# Patient Record
Sex: Female | Born: 1960 | Race: White | Hispanic: No | Marital: Single | Smoking: Current every day smoker
Health system: Southern US, Community
[De-identification: ages and names within clinical notes are randomized; demographics above are authoritative.]

## PROBLEM LIST (undated history)

## (undated) DIAGNOSIS — M1990 Unspecified osteoarthritis, unspecified site: Secondary | ICD-10-CM

## (undated) DIAGNOSIS — I10 Essential (primary) hypertension: Secondary | ICD-10-CM

## (undated) DIAGNOSIS — K219 Gastro-esophageal reflux disease without esophagitis: Secondary | ICD-10-CM

## (undated) DIAGNOSIS — T7840XA Allergy, unspecified, initial encounter: Secondary | ICD-10-CM

## (undated) DIAGNOSIS — G4730 Sleep apnea, unspecified: Secondary | ICD-10-CM

## (undated) DIAGNOSIS — E079 Disorder of thyroid, unspecified: Secondary | ICD-10-CM

## (undated) DIAGNOSIS — E669 Obesity, unspecified: Secondary | ICD-10-CM

## (undated) DIAGNOSIS — N189 Chronic kidney disease, unspecified: Secondary | ICD-10-CM

## (undated) HISTORY — DX: Essential (primary) hypertension: I10

## (undated) HISTORY — DX: Chronic kidney disease, unspecified: N18.9

## (undated) HISTORY — DX: Disorder of thyroid, unspecified: E07.9

## (undated) HISTORY — DX: Gastro-esophageal reflux disease without esophagitis: K21.9

## (undated) HISTORY — DX: Allergy, unspecified, initial encounter: T78.40XA

## (undated) HISTORY — PX: HIP SURGERY: SHX245

## (undated) HISTORY — DX: Unspecified osteoarthritis, unspecified site: M19.90

## (undated) HISTORY — PX: HERNIA REPAIR: SHX51

## (undated) HISTORY — PX: OOPHORECTOMY: SHX86

## (undated) HISTORY — DX: Sleep apnea, unspecified: G47.30

---

## 1988-04-05 HISTORY — PX: CERVICAL CONE BIOPSY: SUR198

## 1998-02-24 ENCOUNTER — Emergency Department (HOSPITAL_COMMUNITY): Admission: EM | Admit: 1998-02-24 | Discharge: 1998-02-24 | Payer: Self-pay | Admitting: Emergency Medicine

## 2009-04-05 HISTORY — PX: ABDOMINAL HYSTERECTOMY: SHX81

## 2009-05-22 ENCOUNTER — Ambulatory Visit: Payer: Self-pay | Admitting: Gynecology

## 2009-05-22 ENCOUNTER — Other Ambulatory Visit: Admission: RE | Admit: 2009-05-22 | Discharge: 2009-05-22 | Payer: Self-pay | Admitting: Gynecology

## 2009-05-29 ENCOUNTER — Ambulatory Visit: Payer: Self-pay | Admitting: Gynecology

## 2009-06-03 ENCOUNTER — Ambulatory Visit (HOSPITAL_COMMUNITY): Admission: RE | Admit: 2009-06-03 | Discharge: 2009-06-03 | Payer: Self-pay | Admitting: Gynecology

## 2009-06-11 ENCOUNTER — Ambulatory Visit (HOSPITAL_COMMUNITY): Admission: RE | Admit: 2009-06-11 | Discharge: 2009-06-11 | Payer: Self-pay | Admitting: Gynecology

## 2009-11-27 ENCOUNTER — Ambulatory Visit: Payer: Self-pay | Admitting: Gynecology

## 2010-01-02 ENCOUNTER — Encounter: Admission: RE | Admit: 2010-01-02 | Discharge: 2010-01-02 | Payer: Self-pay | Admitting: Internal Medicine

## 2010-08-12 ENCOUNTER — Ambulatory Visit (HOSPITAL_COMMUNITY): Payer: 59

## 2010-08-12 ENCOUNTER — Ambulatory Visit (HOSPITAL_COMMUNITY)
Admission: RE | Admit: 2010-08-12 | Discharge: 2010-08-12 | Disposition: A | Discharge status: Home / Self Care | Payer: 59 | Source: Ambulatory Visit | Attending: Surgery | Admitting: Surgery

## 2010-08-12 DIAGNOSIS — I491 Atrial premature depolarization: Secondary | ICD-10-CM | POA: Insufficient documentation

## 2010-08-12 DIAGNOSIS — Z01812 Encounter for preprocedural laboratory examination: Secondary | ICD-10-CM | POA: Insufficient documentation

## 2010-08-12 DIAGNOSIS — I10 Essential (primary) hypertension: Secondary | ICD-10-CM | POA: Insufficient documentation

## 2010-08-12 DIAGNOSIS — I498 Other specified cardiac arrhythmias: Secondary | ICD-10-CM | POA: Insufficient documentation

## 2010-08-12 DIAGNOSIS — L02419 Cutaneous abscess of limb, unspecified: Secondary | ICD-10-CM | POA: Insufficient documentation

## 2010-08-12 DIAGNOSIS — E119 Type 2 diabetes mellitus without complications: Secondary | ICD-10-CM | POA: Insufficient documentation

## 2010-08-12 LAB — DIFFERENTIAL
Lymphocytes Relative: 22 % (ref 12–46)
Monocytes Absolute: 0.9 10*3/uL (ref 0.1–1.0)
Neutro Abs: 8.8 10*3/uL — ABNORMAL HIGH (ref 1.7–7.7)

## 2010-08-12 LAB — BASIC METABOLIC PANEL
BUN: 13 mg/dL (ref 6–23)
Calcium: 10.4 mg/dL (ref 8.4–10.5)
Chloride: 96 mEq/L (ref 96–112)
Creatinine, Ser: 0.71 mg/dL (ref 0.4–1.2)
GFR calc Af Amer: >60 mL/min (ref >60)
Potassium: 3.8 mEq/L (ref 3.5–5.1)

## 2010-08-12 LAB — SURGICAL PCR SCREEN: Staphylococcus aureus: NEGATIVE

## 2010-08-12 LAB — GLUCOSE, CAPILLARY: Glucose-Capillary: 210 mg/dL — ABNORMAL HIGH (ref 70–99)

## 2010-08-12 LAB — CBC
MCH: 32.4 pg (ref 26.0–34.0)
MCHC: 35.9 g/dL (ref 30.0–36.0)
RDW: 13 % (ref 11.5–15.5)

## 2010-08-15 LAB — CULTURE, ROUTINE-ABSCESS

## 2010-08-17 LAB — ANAEROBIC CULTURE

## 2010-08-17 NOTE — H&P (Signed)
  NAME:  Margaret Carey, Margaret Carey NO.:  [id number]  MEDICAL RECORD NO.:  [id number]          PATIENT TYPE:  LOCATION:                                 FACILITY:  PHYSICIAN:  Juanetta Gosling, MDDATE OF BIRTH:  [birth year]  DATE OF ADMISSION: DATE OF DISCHARGE:                             HISTORY & PHYSICAL   CHIEF COMPLAINTS:  Right thigh abscess.  HISTORY OF PRESENT ILLNESS:  This is a 50 year old female with about a 5- day history of an increasing in size increasingly tender inner right thigh mass.  This has been red that it has been hot and it has been worsening over this same time frame.  She denies any fevers.  Denies any drainage.  Denies any prior history of this.  She came in just because it was worsening.  She was put on doxycycline by Dr. Ferd Hibbs office and referred.  PAST MEDICAL HISTORY: 1. Hypertension. 2. Diabetes. 3. Hypothyroidism.  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  FAMILY HISTORY:  Significant for cardiovascular disease and diabetes.  SOCIAL HISTORY:  She is a half-pack per day smoker.  Drinks rare alcohol.  ALLERGIES:  No known drug allergies.  MEDICATIONS:  Janumet, lisinopril, Cytomel, Synthroid and doxycycline.  REVIEW OF SYSTEMS:  Otherwise negative.  PHYSICAL EXAMINATION:  VITAL SIGNS:  Temperature 97.6, pulse 112, blood pressure 128/80.  She is 5 feet 3-1/2 inches, weight 264 pounds. GENERAL:  She is a well-appearing female, in no distress. HEENT:  She has no scleral icterus. NECK:  Supple without adenopathy. HEART:  Tachycardiac.  Regular rhythm. EXTREMITIES:  In her right medial thigh, she has about 20 x 10 cm fluctuant, tender and erythematous abscess.  ASSESSMENT:  Right thigh abscess.  PLAN:  I think this will be best served by drainage in the operating room.  She understands this.  I discussed this with Dr. Daphine Deutscher.  She is going to go over the hospital  and we discussed incision and drainage under anesthesia due to its size.  I think she is also going to be need to be on some IV antibiotics at least overnight.     Juanetta Gosling, MD     MCW/MEDQ  D:  08/12/2010  T:  08/12/2010  Job:  [id number]  cc:   [place] Daphine Deutscher, MD [address], [address] [place] [address]  Gwen Pounds, MD Fax: [phone]  Electronically Signed by Emelia Loron MD on 08/17/2010 02:36:29 PM

## 2010-08-20 NOTE — Op Note (Signed)
  NAME:  Margaret Carey, Margaret Carey NO.:  [id number]  MEDICAL RECORD NO.:  [id number]            PATIENT TYPE:  LOCATION:                                 FACILITY:  PHYSICIAN:  [place]. Daphine Deutscher, MD       DATE OF BIRTH:  DATE OF PROCEDURE:  08/12/2010 DATE OF DISCHARGE:                              OPERATIVE REPORT   PREOPERATIVE DIAGNOSIS:  Right medial thigh abscess.  POSTOPERATIVE DIAGNOSIS:  Right medial thigh abscess.  FINDINGS:  Non-foul-smelling purulent pus drained from this abscess.  It was about golf ball sized, area of induration  SURGEON:  Molli Hazard B. Daphine Deutscher, MD  ANESTHESIA:  General by LMA.  DESCRIPTION OF PROCEDURE:  This 50 year old diabetic lady was sent from the office for drainage of an abscess on her medial thigh.  This area was marked preop and prepped with PCMX.  I went ahead after anesthesia was given and a time-out performed.  I went ahead and aspirated this area with an 18 gauge needle and then cut down into this hard, indurated area and got into frank pus.  I cultured this aerobes and anaerobes.  I explored the wound and broke up any of the loculations.  I then irrigated with 100 cc of saline.  Approximately 8 inches of Iodoform gauze was packed into the wound which was actually little triangular area where I excised a little bit of skin to keep this open and to promote drainage.  She tolerated the procedure well.  Since she had been brought in, was fairly stable and did not appear anyway septic and had been taking doxycycline, I went ahead and gave her 3 g of Unasyn.  We will keep her on doxycycline.  Instructions were given for wound management and we will see her back in the office in about a week.[place] Daphine Deutscher, MD     MBM/MEDQ  D:  08/12/2010  T:  08/12/2010  Job:  [id number]  cc:   Gwen Pounds, MD Fax: [phone]  Electronically Signed by Luretha Murphy MD on 08/20/2010 07:06:21 AM

## 2011-03-06 ENCOUNTER — Encounter: Payer: Self-pay | Admitting: *Deleted

## 2011-03-06 ENCOUNTER — Emergency Department (HOSPITAL_COMMUNITY)
Admission: EM | Admit: 2011-03-06 | Discharge: 2011-03-06 | Disposition: A | Discharge status: Home / Self Care | Payer: 59 | Attending: Emergency Medicine | Admitting: Emergency Medicine

## 2011-03-06 DIAGNOSIS — M545 Low back pain, unspecified: Secondary | ICD-10-CM | POA: Insufficient documentation

## 2011-03-06 DIAGNOSIS — R109 Unspecified abdominal pain: Secondary | ICD-10-CM | POA: Insufficient documentation

## 2011-03-06 HISTORY — DX: Obesity, unspecified: E66.9

## 2011-03-06 LAB — URINALYSIS, ROUTINE W REFLEX MICROSCOPIC
Ketones, ur: NEGATIVE mg/dL
Protein, ur: NEGATIVE mg/dL

## 2011-03-06 NOTE — ED Notes (Signed)
Pain in lower back and rt side for two weeks

## 2011-03-08 ENCOUNTER — Other Ambulatory Visit: Payer: Self-pay | Admitting: Internal Medicine

## 2011-03-09 ENCOUNTER — Ambulatory Visit
Admission: RE | Admit: 2011-03-09 | Discharge: 2011-03-09 | Disposition: A | Discharge status: Home / Self Care | Payer: 59 | Source: Ambulatory Visit | Attending: Internal Medicine | Admitting: Internal Medicine

## 2011-03-09 MED ORDER — IOHEXOL 300 MG/ML  SOLN
125.0000 mL | Freq: Once | INTRAMUSCULAR | Status: AC | PRN
  Administered 2011-03-09: 125 mL via INTRAVENOUS

## 2011-05-07 ENCOUNTER — Other Ambulatory Visit: Payer: Self-pay | Admitting: Endocrinology

## 2011-05-07 DIAGNOSIS — R0989 Other specified symptoms and signs involving the circulatory and respiratory systems: Secondary | ICD-10-CM

## 2011-05-13 ENCOUNTER — Ambulatory Visit
Admission: RE | Admit: 2011-05-13 | Discharge: 2011-05-13 | Disposition: A | Discharge status: Home / Self Care | Payer: 59 | Source: Ambulatory Visit | Attending: Endocrinology | Admitting: Endocrinology

## 2011-05-13 DIAGNOSIS — R0989 Other specified symptoms and signs involving the circulatory and respiratory systems: Secondary | ICD-10-CM

## 2012-01-24 ENCOUNTER — Other Ambulatory Visit: Payer: Self-pay | Admitting: Gynecology

## 2012-01-24 DIAGNOSIS — Z1231 Encounter for screening mammogram for malignant neoplasm of breast: Secondary | ICD-10-CM

## 2012-02-03 ENCOUNTER — Encounter: Payer: Self-pay | Admitting: Gynecology

## 2012-02-03 DIAGNOSIS — I10 Essential (primary) hypertension: Secondary | ICD-10-CM | POA: Insufficient documentation

## 2012-02-03 DIAGNOSIS — E079 Disorder of thyroid, unspecified: Secondary | ICD-10-CM | POA: Insufficient documentation

## 2012-02-10 ENCOUNTER — Ambulatory Visit (INDEPENDENT_AMBULATORY_CARE_PROVIDER_SITE_OTHER): Payer: 59 | Admitting: Gynecology

## 2012-02-10 ENCOUNTER — Encounter: Payer: Self-pay | Admitting: Gynecology

## 2012-02-10 ENCOUNTER — Ambulatory Visit (HOSPITAL_COMMUNITY)
Admission: RE | Admit: 2012-02-10 | Discharge: 2012-02-10 | Disposition: A | Discharge status: Home / Self Care | Payer: 59 | Source: Ambulatory Visit | Attending: Gynecology | Admitting: Gynecology

## 2012-02-10 VITALS — BP 120/74 | Ht 63.0 in | Wt 252.0 lb

## 2012-02-10 DIAGNOSIS — IMO0002 Reserved for concepts with insufficient information to code with codable children: Secondary | ICD-10-CM

## 2012-02-10 DIAGNOSIS — Z1231 Encounter for screening mammogram for malignant neoplasm of breast: Secondary | ICD-10-CM | POA: Insufficient documentation

## 2012-02-10 DIAGNOSIS — Z01419 Encounter for gynecological examination (general) (routine) without abnormal findings: Secondary | ICD-10-CM

## 2012-02-10 DIAGNOSIS — N951 Menopausal and female climacteric states: Secondary | ICD-10-CM

## 2012-02-10 MED ORDER — ESTRADIOL 0.05 MG/24HR TD PTTW: 1.0000 | MEDICATED_PATCH | TRANSDERMAL | Status: DC

## 2012-02-10 NOTE — Patient Instructions (Addendum)
Schedule bone density Schedule colonoscopy      Hormone Therapy At menopause, your body begins making less estrogen and progesterone hormones. This causes the body to stop having menstrual periods. This is because estrogen and progesterone hormones control your periods and menstrual cycle. A lack of estrogen may cause symptoms such as:  Hot flushes (or hot flashes).  Vaginal dryness.  Dry skin.  Loss of sex drive.  Risk of bone loss (osteoporosis). When this happens, you may choose to take hormone therapy to get back the estrogen lost during menopause. When the hormone estrogen is given alone, it is usually referred to as ET (Estrogen Therapy). When the hormone progestin is combined with estrogen, it is generally called HT (Hormone Therapy). This was formerly known as hormone replacement therapy (HRT). Your caregiver can help you make a decision on what will be best for you. The decision to use HT seems to change often as new studies are done. Many studies do not agree on the benefits of hormone replacement therapy. LIKELY BENEFITS OF HT INCLUDE PROTECTION FROM:  Hot Flushes (also called hot flashes) - A hot flush is a sudden feeling of heat that spreads over the face and body. The skin may redden like a blush. It is connected with sweats and sleep disturbance. Women going through menopause may have hot flushes a few times a month or several times per day depending on the woman.  Osteoporosis (bone loss)- Estrogen helps guard against bone loss. After menopause, a woman's bones slowly lose calcium and become weak and brittle. As a result, bones are more likely to break. The hip, wrist, and spine are affected most often. Hormone therapy can help slow bone loss after menopause. Weight bearing exercise and taking calcium with vitamin D also can help prevent bone loss. There are also medications that your caregiver can prescribe that can help prevent osteoporosis.  Vaginal Dryness - Loss of  estrogen causes changes in the vagina. Its lining may become thin and dry. These changes can cause pain and bleeding during sexual intercourse. Dryness can also lead to infections. This can cause burning and itching. (Vaginal estrogen treatment can help relieve pain, itching, and dryness.)  Urinary Tract Infections are more common after menopause because of lack of estrogen. Some women also develop urinary incontinence because of low estrogen levels in the vagina and bladder.  Possible other benefits of estrogen include a positive effect on mood and short-term memory in women. RISKS AND COMPLICATIONS  Using estrogen alone without progesterone causes the lining of the uterus to grow. This increases the risk of lining of the uterus (endometrial) cancer. Your caregiver should give another hormone called progestin if you have a uterus.  Women who take combined (estrogen and progestin) HT appear to have an increased risk of breast cancer. The risk appears to be small, but increases throughout the time that HT is taken.  Combined therapy also makes the breast tissue slightly denser which makes it harder to read mammograms (breast X-rays).  Combined, estrogen and progesterone therapy can be taken together every day, in which case there may be spotting of blood. HT therapy can be taken cyclically in which case you will have menstrual periods. Cyclically means HT is taken for a set amount of days, then not taken, then this process is repeated.  HT may increase the risk of stroke, heart attack, breast cancer and forming blood clots in your leg.  Transdermal estrogen (estrogen that is absorbed through the skin with a  patch or a cream) may have more positive results with:  Cholesterol.  Blood pressure.  Blood clots. Having the following conditions may indicate you should not have HT:  Endometrial cancer.  Liver disease.  Breast cancer.  Heart disease.  History of blood  clots.  Stroke. TREATMENT   If you choose to take HT and have a uterus, usually estrogen and progestin are prescribed.  Your caregiver will help you decide the best way to take the medications.  Possible ways to take estrogen include:  Pills.  Patches.  Gels.  Sprays.  Vaginal estrogen cream, rings and tablets.  It is best to take the lowest dose possible that will help your symptoms and take them for the shortest period of time that you can.  Hormone therapy can help relieve some of the problems (symptoms) that affect women at menopause. Before making a decision about HT, talk to your caregiver about what is best for you. Be well informed and comfortable with your decisions. HOME CARE INSTRUCTIONS   Follow your caregivers advice when taking the medications.  A Pap test is done to screen for cervical cancer.  The first Pap test should be done at age 76.  Between ages 25 and 29, Pap tests are repeated every 2 years.  Beginning at age 60, you are advised to have a Pap test every 3 years as long as your past 3 Pap tests have been normal.  Some women have medical problems that increase the chance of getting cervical cancer. Talk to your caregiver about these problems. It is especially important to talk to your caregiver if a new problem develops soon after your last Pap test. In these cases, your caregiver may recommend more frequent screening and Pap tests.  The above recommendations are the same for women who have or have not gotten the vaccine for HPV (Human Papillomavirus).  If you had a hysterectomy for a problem that was not a cancer or a condition that could lead to cancer, then you no longer need Pap tests. However, even if you no longer need a Pap test, a regular exam is a good idea to make sure no other problems are starting.   If you are between ages 77 and 25, and you have had normal Pap tests going back 10 years, you no longer need Pap tests. However, even if you  no longer need a Pap test, a regular exam is a good idea to make sure no other problems are starting.   If you have had past treatment for cervical cancer or a condition that could lead to cancer, you need Pap tests and screening for cancer for at least 20 years after your treatment.  If Pap tests have been discontinued, risk factors (such as a new sexual partner) need to be re-assessed to determine if screening should be resumed.  Some women may need screenings more often if they are at high risk for cervical cancer.  Get mammograms done as per the advice of your caregiver. SEEK IMMEDIATE MEDICAL CARE IF:  You develop abnormal vaginal bleeding.  You have pain or swelling in your legs, shortness of breath, or chest pain.  You develop dizziness or headaches.  You have lumps or changes in your breasts or armpits.  You have slurred speech.  You develop weakness or numbness of your arms or legs.  You have pain, burning, or bleeding when urinating.  You develop abdominal pain. Document Released: 12/19/2002 Document Revised: 06/14/2011 Document Reviewed: 04/08/2010 [employer]  Patient Information 2013 [employer].  

## 2012-02-10 NOTE — Progress Notes (Signed)
Margaret Carey 1960/09/17 [id number]        51 y.o.  [id number] for annual exam.    Past medical history,surgical history, medications, allergies, family history and social history were all reviewed and documented in the EPIC chart. ROS:  Was performed and pertinent positives and negatives are included in the history.  Exam: Kim assistant Filed Vitals:   02/10/12 0959  BP: 120/74  Height: 5\' 3"  (1.6 m)  Weight: 252 lb (114.306 kg)   General appearance  Normal Skin grossly normal Head/Neck normal with no cervical or supraclavicular adenopathy thyroid normal Lungs  clear Cardiac RR, without RMG Abdominal  soft, nontender, without masses, organomegaly or hernia Breasts  examined lying and sitting without masses, retractions, discharge or axillary adenopathy. Pelvic  Ext/BUS/vagina  normal   Adnexa  Without masses or tenderness    Anus and perineum  normal   Rectovaginal  normal sphincter tone without palpated masses or tenderness.    Assessment/Plan:  51 y.o. [id number] female for annual exam.   1. Status post TAH RSO for mature cystic teratoma by Dr. Noland Fordyce 2011. Had prior LSO. Had been placed on Vivelle dot ERT but was discontinued. Now with hot flashes/night sweats/sleep disturbances/vaginal dryness and dyspareunia. Had discussed with Dr. Serrita Lueth Lasso her primary physician and per her history she felt uncomfortable with the ERT. I discussed the issues of ERT risks/benefits WHI study with increased risk of stroke heart attack DVT possible breast cancer issues. The issues of transdermal versus oral/first pass effect. She does have medical issues, smokes and is obese. I reviewed with her there is evidence that ERT may benefit her from a cardiovascular standpoint if started early but certainly she does have increased risk factors for thrombosis would have to accept this with ERT. Patient wants to start and I prescribed MiniVivelle 0.05  2 weeks sample one year prescription. She'll call me in follow up and  if she does well she'll continue if not then we'll increase to 0.075 and possibly 0.1. 2. Mammography. Patient had today and will continue with annual mammograms. SBE monthly reviewed. 3. Pap smear. No Pap smear done today. Last Pap smear 2011. She does have a history of cone biopsy in 1990. She's over 20 years out and the options to stop screening altogether versus less frequent screening reviewed. At this point I recommend less frequent screening every 3 years and she agrees with this. 4. Colonoscopy. Patient never had and I recommend she arrange now she agrees to do so. 5. Bone density. Patient reports 10 years ago. We'll schedule baseline now. Increase calcium vitamin D discussed. 6. Health maintenance. No blood work done today as it is done through Dr. Ferd Hibbs office. Follow up with ERT results and for DEXA, otherwise annually.    Dara Lords MD, 10:39 AM 02/10/2012

## 2012-11-29 ENCOUNTER — Institutional Professional Consult (permissible substitution): Payer: Self-pay | Admitting: Neurology

## 2012-12-06 ENCOUNTER — Institutional Professional Consult (permissible substitution): Payer: Self-pay | Admitting: Neurology

## 2012-12-13 ENCOUNTER — Ambulatory Visit (INDEPENDENT_AMBULATORY_CARE_PROVIDER_SITE_OTHER): Payer: 59 | Admitting: Neurology

## 2012-12-13 ENCOUNTER — Encounter: Payer: Self-pay | Admitting: Neurology

## 2012-12-13 VITALS — BP 134/80 | HR 99 | Temp 98.7°F | Ht 64.0 in | Wt 251.0 lb

## 2012-12-13 DIAGNOSIS — G4733 Obstructive sleep apnea (adult) (pediatric): Secondary | ICD-10-CM

## 2012-12-13 NOTE — Progress Notes (Signed)
Subjective:    Patient ID: Margaret Carey is a 52 y.o. female.  HPI  Huston Foley, MD, PhD [facility] [address], [address] [address] [place], [place] [address]   Dear Dr. Timothy Lasso,  I saw your patient, Margaret Carey,  upon your kind request in my neurologic clinic today for initial consultation of her sleep disorder, in particular concern for obstructive sleep apnea. The patient is unaccompanied today. As you know, Ms. Aldama is a very pleasant 52 year old right-handed woman with an underlying medical history of hypothyroidism, diabetes, hyperlipidemia, hypertension, obesity, and insomnia, who has been noted to snore and reports witnessed breathing pauses while asleep. She also complains of nonrestorative sleep and trouble staying asleep as well as going to sleep. This started about 7 years ago, when her father became sick and then he passed away. Her mother passed away in 04/24/22. She switched from 3rd shift to 1st shift about 18 months ago. 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  She never tried Ambien, for concern for [employer]. She has tried trazodone, but feels drowsy during the day and has not been taking it for that reason.   Her typical bedtime is reported to be around 7 PM and usual wake time is around 2:30 to 3:30 AM. Sleep onset typically occurs within hours, some times 2, sometimes 4 hours. She reports feeling poorly rested upon awakening. She wakes up on an average 2 to 4 times in the middle of the night and has to go to the bathroom 1 to 2 times on a typical night. She reports rare morning headaches, only with sinus congestion.  She reports excessive daytime somnolence (EDS) and Her Epworth Sleepiness Score (ESS) is 7/24 today. She has not fallen asleep while driving. The patient has not been taking a planned nap, but may sleep on WE for 20 to 60 minutes, which is occasionally refreshing.   She has been known to snore for the past many years. Snoring is reportedly  marked, and associated with choking sounds and witnessed apneas. The patient admits to a sense of choking or strangling feeling. There is no report of nighttime reflux, with no nighttime cough experienced. The patient has noted mild RLS symptoms and is not known to kick while asleep or before falling asleep. She had b/l THR in 2000, 2001 and again on the L in 2002. There is no family history of RLS or OSA.  She is a restless sleeper and in the morning, the bed is at times disheveled. She turns from side to side d/t hip pain and sometimes sleeps on the couch or recliner. She has not been sleeping on her back d/t LBP.  She denies cataplexy, sleep paralysis, hypnagogic or hypnopompic hallucinations, or sleep attacks. She does not report any vivid dreams, nightmares, dream enactments, or parasomnias, such as sleep talking or sleep walking. The patient has not had a sleep study or a home sleep test.  She consumes 4 caffeinated beverages per day, usually in the form of coffee.  Her bedroom is usually dark and cool. There is a TV in the bedroom and usually it is not on at night.   Her Past Medical History Is Significant For: Past Medical History  Diagnosis Date  . Diabetes mellitus   . Obese   . Thyroid disease     Hypothyroid  . Hypertension     Her Past Surgical History Is Significant For: Past Surgical History  Procedure Laterality Date  . Hip  surgery      Bilateral replacement  . Oophorectomy      LSO and RSO  . Hernia repair    . Cervical cone biopsy  1990  . Abdominal hysterectomy  2011    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    Her Family History Is Significant For: Family History  Problem Relation Age of Onset  . Breast cancer Mother     Age 14  . Diabetes Mother   . Lung cancer Father     Her Social History Is Significant For: History   Social History  . Marital Status: Single    Spouse Name: N/A    Number of Children: N/A  . Years of Education: N/A   Social History Main Topics   . Smoking status: Current Every Day Smoker -- 0.50 packs/day    Types: Cigarettes  . Smokeless tobacco: None  . Alcohol Use: Yes     Comment: Rare  . Drug Use: No  . Sexual Activity: No   Other Topics Concern  . None   Social History Narrative  . None    Her Allergies Are:  No Known Allergies:   Her Current Medications Are:  Outpatient Encounter Prescriptions as of 12/13/2012  Medication Sig Dispense Refill  . ARMOUR THYROID PO Take by mouth.      Marland Kitchen glimepiride (AMARYL) 4 MG tablet Take 4 mg by mouth 2 (two) times daily.        Marland Kitchen lisinopril-hydrochlorothiazide (PRINZIDE,ZESTORETIC) 10-12.5 MG per tablet Take 1 tablet by mouth 2 (two) times daily.        . sitaGLIPtan-metformin (JANUMET) 50-1000 MG per tablet Take 1 tablet by mouth 2 (two) times daily with a meal.        . estradiol (MINIVELLE) 0.05 MG/24HR Place 1 patch (0.05 mg total) onto the skin 2 (two) times a week.  8 patch  12  . traZODone (DESYREL) 50 MG tablet Take 1 tablet by mouth as needed.      . [DISCONTINUED] guaiFENesin (MUCINEX) 600 MG 12 hr tablet Take 1,200 mg by mouth 2 (two) times daily.         No facility-administered encounter medications on file as of 12/13/2012.  :  Review of Systems:  Out of a complete 14 point review of systems, all are reviewed and negative with the exception of these symptoms as listed below:  Review of Systems  Constitutional: Positive for fatigue.  Eyes: Positive for visual disturbance (blurred vision).  Respiratory:       Snoring  Endocrine: Positive for heat intolerance.  Neurological: Positive for dizziness.       Memory loss  Psychiatric/Behavioral: Positive for sleep disturbance and dysphoric mood.    Objective:  Neurologic Exam  Physical Exam Physical Examination:   Filed Vitals:   12/13/12 0957  BP: 134/80  Pulse: 99  Temp: 98.7 F (37.1 C)    General Examination: The patient is a very pleasant 52 y.o. female in no acute distress. She appears  well-developed and well-nourished and very well groomed. She is obese.  HEENT: Normocephalic, atraumatic, pupils are equal, round and reactive to light and accommodation. Funduscopic exam is normal with sharp disc margins noted. Extraocular tracking is good without limitation to gaze excursion or nystagmus noted. Normal smooth pursuit is noted. Hearing is grossly intact. Tympanic membranes are clear bilaterally. Face is symmetric with normal facial animation and normal facial sensation. Speech is clear with no dysarthria noted. There is no hypophonia. There is no lip, neck/head, jaw  or voice tremor. Neck is supple with full range of passive and active motion. There are no carotid bruits on auscultation. Oropharynx exam reveals: mild mouth dryness, adequate dental hygiene and moderate airway crowding, due to narrow airway entry, larger uvula and larger tongue and tonsillar size. Mallampati is class II. Tongue protrudes centrally and palate elevates symmetrically. Tonsils are 1+ to 2+. Neck size is 17 inches.   Chest: Clear to auscultation without wheezing, rhonchi or crackles noted.  Heart: S1+S2+0, regular and normal without murmurs, rubs or gallops noted.   Abdomen: Soft, non-tender and non-distended with normal bowel sounds appreciated on auscultation.  Extremities: There is trace pitting edema in the distal lower extremities bilaterally. Pedal pulses are intact.  Skin: Warm and dry without trophic changes noted. There are varicose veins in the R distal leg and the L proximal.  Musculoskeletal: exam reveals no obvious joint deformities, tenderness or joint swelling or erythema.   Neurologically:  Mental status: The patient is awake, alert and oriented in all 4 spheres. Her memory, attention, language and knowledge are appropriate. There is no aphasia, agnosia, apraxia or anomia. Speech is clear with normal prosody and enunciation. Thought process is linear. Mood is congruent and affect is normal.   Cranial nerves are as described above under HEENT exam. In addition, shoulder shrug is normal with equal shoulder height noted. Motor exam: Normal bulk, strength and tone is noted. There is no drift, tremor or rebound. Romberg is negative. Reflexes are 2+ throughout. Toes are downgoing bilaterally. Fine motor skills are intact with normal finger taps, normal hand movements, normal rapid alternating patting, normal foot taps and normal foot agility.  Cerebellar testing shows no dysmetria or intention tremor on finger to nose testing. Heel to shin is unremarkable bilaterally. There is no truncal or gait ataxia.  Sensory exam is intact to light touch, pinprick, vibration, temperature sense and proprioception in the upper and lower extremities.  Gait, station and balance are unremarkable. No veering to one side is noted. No leaning to one side is noted. Posture is age-appropriate and stance is narrow based. No problems turning are noted. She turns en bloc. Tandem walk is difficult for her. Intact toe stance is noted.               Assessment and Plan:   In summary, VELEKA DJORDJEVIC is a very pleasant 52 y.o.-year old female with a history and physical exam concerning for obstructive sleep apnea (OSA). I had a long chat with the patient about my findings and the diagnosis, its prognosis and treatment options. We talked about medical treatments and non-pharmacological approaches. I explained in particular the risks and ramifications of untreated moderate to severe OSA, especially with respect to developing cardiovascular disease down the Road, including congestive heart failure, difficult to treat hypertension, cardiac arrhythmias, or stroke. Even type 2 diabetes has in part been linked to untreated OSA. We talked about trying to maintain a healthy lifestyle in general, as well as the importance of weight control. I encouraged the patient to eat healthy, exercise daily and keep well hydrated, to keep a scheduled  bedtime and wake time routine, to not skip any meals and eat healthy snacks in between meals.  I recommended the following at this time: sleep study with potential CPAP titration.  I explained the sleep test procedure to the patient and also outlined surgical and non-surgical treatment options of OSA including the use of a dental custom-made appliance, upper airway surgery such as pillar  implants, radiofrequency surgery, tongue base surgery, and UPPP. I also explained the CPAP treatment option to the patient, who indicated that she would be willing to try CPAP if the need arises. I explained the importance of being compliant with PAP treatment, not only for insurance purposes but primarily for the patient's long term health benefit. I answered all her questions today and the patient was in agreement. I would like to see her back after the sleep study is completed and encouraged her to call with any interim questions, concerns, problems or updates.   Thank you very much for allowing me to participate in the care of this nice patient. If I can be of any further assistance to you please do not hesitate to call me at [phone].  Sincerely,   Huston Foley, MD, PhD

## 2012-12-13 NOTE — Patient Instructions (Addendum)

## 2013-01-12 ENCOUNTER — Encounter: Payer: 59 | Admitting: Neurology

## 2013-01-15 ENCOUNTER — Telehealth: Payer: Self-pay | Admitting: Neurology

## 2013-01-15 NOTE — Telephone Encounter (Signed)
Pt will be unable to proceed with sleep study appointment due to high deductible and out of pocket expense.  She would like to proceed during her new insurance benefit cycle.  Advised patient that ordering physician and referring provider would be notified.

## 2013-05-17 ENCOUNTER — Other Ambulatory Visit (HOSPITAL_COMMUNITY): Payer: Self-pay | Admitting: Internal Medicine

## 2013-05-17 DIAGNOSIS — Z1231 Encounter for screening mammogram for malignant neoplasm of breast: Secondary | ICD-10-CM

## 2013-06-13 ENCOUNTER — Ambulatory Visit (HOSPITAL_COMMUNITY)
Admission: RE | Admit: 2013-06-13 | Discharge: 2013-06-13 | Disposition: A | Discharge status: Home / Self Care | Payer: 59 | Source: Ambulatory Visit | Attending: Internal Medicine | Admitting: Internal Medicine

## 2013-06-13 DIAGNOSIS — Z1231 Encounter for screening mammogram for malignant neoplasm of breast: Secondary | ICD-10-CM | POA: Insufficient documentation

## 2013-11-26 ENCOUNTER — Encounter: Payer: Self-pay | Admitting: Gastroenterology

## 2014-01-22 ENCOUNTER — Encounter: Payer: 59 | Admitting: Gastroenterology

## 2014-02-04 ENCOUNTER — Encounter: Payer: Self-pay | Admitting: Neurology

## 2014-04-09 ENCOUNTER — Encounter: Payer: Self-pay | Admitting: Internal Medicine

## 2014-05-08 ENCOUNTER — Ambulatory Visit (AMBULATORY_SURGERY_CENTER): Payer: Self-pay

## 2014-05-08 VITALS — Ht 62.5 in | Wt 252.6 lb

## 2014-05-08 DIAGNOSIS — Z1211 Encounter for screening for malignant neoplasm of colon: Secondary | ICD-10-CM

## 2014-05-08 MED ORDER — MOVIPREP 100 G PO SOLR: 1.0000 | Freq: Once | ORAL | Status: DC

## 2014-05-08 NOTE — Progress Notes (Signed)
No allergies to eggs or soyo No diet/weight loss meds No home oxygen No past problelms with anesthesia  Has email  Emmi instructions given for colonoscopy

## 2014-05-22 ENCOUNTER — Encounter: Payer: Self-pay | Admitting: Internal Medicine

## 2014-05-24 ENCOUNTER — Ambulatory Visit (AMBULATORY_SURGERY_CENTER): Payer: 59 | Admitting: Internal Medicine

## 2014-05-24 ENCOUNTER — Encounter: Payer: Self-pay | Admitting: Internal Medicine

## 2014-05-24 VITALS — BP 120/85 | HR 84 | Temp 98.7°F | Resp 13 | Ht 62.5 in | Wt 252.0 lb

## 2014-05-24 DIAGNOSIS — D125 Benign neoplasm of sigmoid colon: Secondary | ICD-10-CM

## 2014-05-24 DIAGNOSIS — Z1211 Encounter for screening for malignant neoplasm of colon: Secondary | ICD-10-CM

## 2014-05-24 DIAGNOSIS — D122 Benign neoplasm of ascending colon: Secondary | ICD-10-CM

## 2014-05-24 DIAGNOSIS — K635 Polyp of colon: Secondary | ICD-10-CM

## 2014-05-24 MED ORDER — SODIUM CHLORIDE 0.9 % IV SOLN: 500.0000 mL | INTRAVENOUS | Status: DC

## 2014-05-24 NOTE — Patient Instructions (Signed)
YOU HAD AN ENDOSCOPIC PROCEDURE TODAY AT THE [facility]: Refer to the procedure report that was given to you for any specific questions about what was found during the examination.  If the procedure report does not answer your questions, please call your gastroenterologist to clarify.  If you requested that your care partner not be given the details of your procedure findings, then the procedure report has been included in a sealed envelope for you to review at your convenience later.  YOU SHOULD EXPECT: Some feelings of bloating in the abdomen. Passage of more gas than usual.  Walking can help get rid of the air that was put into your GI tract during the procedure and reduce the bloating. If you had a lower endoscopy (such as a colonoscopy or flexible sigmoidoscopy) you may notice spotting of blood in your stool or on the toilet paper. If you underwent a bowel prep for your procedure, then you may not have a normal bowel movement for a few days.  DIET: Your first meal following the procedure should be a light meal and then it is ok to progress to your normal diet.  A half-sandwich or bowl of soup is an example of a good first meal.  Heavy or fried foods are harder to digest and may make you feel nauseous or bloated.  Likewise meals heavy in dairy and vegetables can cause extra gas to form and this can also increase the bloating.  Drink plenty of fluids but you should avoid alcoholic beverages for 24 hours.  ACTIVITY: Your care partner should take you home directly after the procedure.  You should plan to take it easy, moving slowly for the rest of the day.  You can resume normal activity the day after the procedure however you should NOT DRIVE or use heavy machinery for 24 hours (because of the sedation medicines used during the test).    SYMPTOMS TO REPORT IMMEDIATELY: A gastroenterologist can be reached at any hour.  During normal business hours, 8:30 AM to 5:00 PM Monday through Friday,  call [phone].  After hours and on weekends, please call the GI answering service at [phone] who will take a message and have the physician on call contact you.   Following lower endoscopy (colonoscopy or flexible sigmoidoscopy):  Excessive amounts of blood in the stool  Significant tenderness or worsening of abdominal pains  Swelling of the abdomen that is new, acute  Fever of 100F or higher  FOLLOW UP: If any biopsies were taken you will be contacted by phone or by letter within the next 1-3 weeks.  Call your gastroenterologist if you have not heard about the biopsies in 3 weeks.  Our staff will call the home number listed on your records the next business day following your procedure to check on you and address any questions or concerns that you may have at that time regarding the information given to you following your procedure. This is a courtesy call and so if there is no answer at the home number and we have not heard from you through the emergency physician on call, we will assume that you have returned to your regular daily activities without incident.  SIGNATURES/CONFIDENTIALITY: You and/or your care partner have signed paperwork which will be entered into your electronic medical record.  These signatures attest to the fact that that the information above on your After Visit Summary has been reviewed and is understood.  Full responsibility of the confidentiality of this   discharge information lies with you and/or your care-partner.    Handouts were given to your care partner on polyps, diverticulosis,and a high fiber diet with liberal fluid intake. You might notice some irritation in your nose or drainage.  This may cause feelings of congestion.  This is from the oxygen, which can be drying.  This is no cause for concern; this should clear up in a few days.  You may resume your current medications today. Await biopsy results. Your blood sugar was 150 in the recovery  room.  Please call if any questions or concerns.

## 2014-05-24 NOTE — Op Note (Signed)
[place]  [address] [place], [address]   COLONOSCOPY PROCEDURE REPORT  PATIENT: Margaret Carey, Margaret Carey  MR#: [id number] BIRTHDATE: [birth year] , 30  yrs. old GENDER: female ENDOSCOPIST: Lafayette Dragon, MD REFERRED SH:NGIT Virgina Jock, M.D. PROCEDURE DATE:  05/24/2014 PROCEDURE:   Colonoscopy with cold biopsy polypectomy First Screening Colonoscopy - Avg.  risk and is 50 yrs.  old or older Yes.  Prior Negative Screening - Now for repeat screening. N/A  History of Adenoma - Now for follow-up colonoscopy & has been > or = to 3 yrs.  N/A  Polyps Removed Today? Yes. ASA CLASS:   Class II INDICATIONS:average risk patient for colon cancer. MEDICATIONS: Propofol 500 mg IV  DESCRIPTION OF PROCEDURE:   After the risks benefits and alternatives of the procedure were thoroughly explained, informed consent was obtained.  The digital rectal exam revealed no abnormalities of the rectum.   The LB [id number]  endoscope was introduced through the anus and advanced to the cecum, which was identified by the ileocecal valve. No adverse events experienced.   The quality of the prep was good, using MoviPrep The instrument was then slowly withdrawn as the colon was fully examined.      COLON FINDINGS: Five flat polyps measuring 3 mm in size were found in the ascending colon and sigmoid colon.  A polypectomy was performed with cold forceps.  The resection was complete, the polyp tissue was completely retrieved and sent to histology.   There was mild diverticulosis noted in the ascending colon.  Retroflexed views revealed no abnormalities. The time to cecum=13 minutes 42 seconds.  Withdrawal time=8 minutes 58 seconds.  The scope was withdrawn and the procedure completed. COMPLICATIONS: There were no immediate complications.  ENDOSCOPIC IMPRESSION: 1.   Five flat polyps were found in the ascending x3 colon and sigmoid colon  x2 , polypectomy was performed with cold forceps 2.    There was mild diverticulosis noted in the ascending colon  RECOMMENDATIONS: 1.  Await pathology results 2.  High fiber diet Recall colonoscopy pending path report  eSigned:  Lafayette Dragon, MD 05/24/2014 8:47 AM   cc:   PATIENT NAME:  Margaret Carey, Margaret Carey MR#: [id number]

## 2014-05-24 NOTE — Progress Notes (Signed)
To recovery, report given and VSS.

## 2014-05-24 NOTE — Progress Notes (Signed)
No problems noted in the recovery room. maw 

## 2014-05-24 NOTE — Progress Notes (Signed)
Called to room to assist during endoscopic procedure.  Patient ID and intended procedure confirmed with present staff. Received instructions for my participation in the procedure from the performing physician.  

## 2014-05-27 ENCOUNTER — Telehealth: Payer: Self-pay | Admitting: *Deleted

## 2014-05-27 NOTE — Telephone Encounter (Signed)
No answer, message left for the patient. 

## 2014-05-28 ENCOUNTER — Encounter: Payer: Self-pay | Admitting: Internal Medicine

## 2014-06-24 ENCOUNTER — Other Ambulatory Visit (HOSPITAL_COMMUNITY): Payer: Self-pay | Admitting: Internal Medicine

## 2014-06-24 DIAGNOSIS — Z1231 Encounter for screening mammogram for malignant neoplasm of breast: Secondary | ICD-10-CM

## 2014-06-27 ENCOUNTER — Ambulatory Visit (HOSPITAL_COMMUNITY)
Admission: RE | Admit: 2014-06-27 | Discharge: 2014-06-27 | Disposition: A | Discharge status: Home / Self Care | Payer: 59 | Source: Ambulatory Visit | Attending: Internal Medicine | Admitting: Internal Medicine

## 2014-06-27 DIAGNOSIS — Z1231 Encounter for screening mammogram for malignant neoplasm of breast: Secondary | ICD-10-CM | POA: Insufficient documentation

## 2015-02-25 ENCOUNTER — Emergency Department (HOSPITAL_BASED_OUTPATIENT_CLINIC_OR_DEPARTMENT_OTHER)
Admission: EM | Admit: 2015-02-25 | Discharge: 2015-02-25 | Disposition: A | Discharge status: Home / Self Care | Payer: 59 | Attending: Emergency Medicine | Admitting: Emergency Medicine

## 2015-02-25 ENCOUNTER — Encounter (HOSPITAL_BASED_OUTPATIENT_CLINIC_OR_DEPARTMENT_OTHER): Payer: Self-pay | Admitting: Emergency Medicine

## 2015-02-25 DIAGNOSIS — Z79899 Other long term (current) drug therapy: Secondary | ICD-10-CM | POA: Diagnosis not present

## 2015-02-25 DIAGNOSIS — K610 Anal abscess: Secondary | ICD-10-CM | POA: Diagnosis present

## 2015-02-25 DIAGNOSIS — E119 Type 2 diabetes mellitus without complications: Secondary | ICD-10-CM | POA: Insufficient documentation

## 2015-02-25 DIAGNOSIS — Z7982 Long term (current) use of aspirin: Secondary | ICD-10-CM | POA: Diagnosis not present

## 2015-02-25 DIAGNOSIS — F17210 Nicotine dependence, cigarettes, uncomplicated: Secondary | ICD-10-CM | POA: Diagnosis not present

## 2015-02-25 DIAGNOSIS — E669 Obesity, unspecified: Secondary | ICD-10-CM | POA: Insufficient documentation

## 2015-02-25 DIAGNOSIS — E039 Hypothyroidism, unspecified: Secondary | ICD-10-CM | POA: Diagnosis not present

## 2015-02-25 DIAGNOSIS — I10 Essential (primary) hypertension: Secondary | ICD-10-CM | POA: Diagnosis not present

## 2015-02-25 MED ORDER — HYDROCODONE-ACETAMINOPHEN 5-325 MG PO TABS: 1.0000 | ORAL_TABLET | Freq: Four times a day (QID) | ORAL | Status: DC | PRN

## 2015-02-25 MED ORDER — LIDOCAINE-EPINEPHRINE 2 %-1:100000 IJ SOLN: 10.0000 mL | Freq: Once | INTRAMUSCULAR | Status: DC

## 2015-02-25 MED ORDER — HYDROCODONE-ACETAMINOPHEN 5-325 MG PO TABS
1.0000 | ORAL_TABLET | Freq: Once | ORAL | Status: AC
  Administered 2015-02-25: 1 via ORAL
  Filled 2015-02-25: qty 1

## 2015-02-25 MED ORDER — LIDOCAINE-EPINEPHRINE (PF) 2 %-1:200000 IJ SOLN
10.0000 mL | Freq: Once | INTRAMUSCULAR | Status: AC
  Administered 2015-02-25: 10 mL

## 2015-02-25 NOTE — Discharge Instructions (Signed)
Perianal Abscess °An abscess is an infected area that contains a collection of pus and debris. A perianal abscess is one that occurs in the perineal area, which is the area between the anus and the scrotum in males and between the anus and the vagina in females. Perianal abscesses can vary in size. Without treatment, a perianal abscess can become larger and cause other problems. °CAUSES  °Glands in the perineal area can become plugged up with debris. When this happens, an abscess may form.  °SIGNS AND SYMPTOMS  °The most common symptoms of a perianal abscess are: °· Swelling and redness in the area of the abscess. The redness may go beyond the abscess and appear as a red streak on the skin. °· Pain in the area of the abscess. °Other possible symptoms include:  °· A visible lump or a lump that can be felt when touching the area and is usually painful. °· Bleeding or pus-like discharge from the area. °· Fever. °· General weakness. °DIAGNOSIS  °Your health care provider will take a medical history and examine the area. This may involve examining the rectal area with a gloved hand (digital rectal exam). For women, it may require a careful vaginal exam. Sometimes, the health care provider needs to look into the rectum using a probe or scope. °TREATMENT  °Treatment often requires making a cut (incision) in the abscess to drain the pus. This can sometimes be done in your health care provider's office or an emergency department after giving you medicine to numb the area (local anesthetic). For larger or deeper abscesses, surgery may be required to drain the abscess. Antibiotic medicines are sometimes given if there is infection of the surrounding tissue (cellulitis). In some cases, gauze is packed into the abscess to continue draining the area. Frequent sitz baths may be recommended to help the wound heal and to reduce the chance of the abscess coming back. °HOME CARE INSTRUCTIONS  °· Only take over-the-counter or  prescription medicines for pain, fever, or discomfort as directed by your health care provider. °· Take antibiotic medicine as directed. Make sure you finish it even if you start to feel better. °· If gauze is used in the abscess, follow your health care provider's instructions for removing or changing the gauze. It can usually be removed in 2-3 days. °· If one or more drains have been placed in the abscess cavity, be careful not to pull at them. Your health care provider will tell you how long they need to remain in place. °· Take warm sitz baths 3-4 times a day and after bowel movements. This will help reduce pain and swelling. °· Keep the skin around the abscess clean and dry. Avoid cleaning the area too much. °· Avoid scratching the abscess area. °· Avoid using colored or perfumed toilet papers. °SEEK MEDICAL CARE IF:  °· You have trouble having a bowel movement or passing urine. °· Your pain or swelling in the affected area does not seem to be improving. °· The gauze packing or the drains come out before the planned time. °SEEK IMMEDIATE MEDICAL CARE IF:  °· You have problems moving or using your legs. °· You have severe or increasing pain. °· Your swelling in the affected area suddenly gets worse. °· You have a large increase in bleeding or passing of pus. °· You have chills or a fever. °MAKE SURE YOU:  °· Understand these instructions. °· Will watch your condition. °· Will get help right away if you are   not doing well or get worse.   This information is not intended to replace advice given to you by your health care provider. Make sure you discuss any questions you have with your health care provider.   Document Released: 04/28/2006 Document Revised: 01/10/2013 Document Reviewed: 11/01/2012 Elsevier Interactive Patient Education [employer].

## 2015-02-25 NOTE — ED Notes (Signed)
Patient I&D site covered with 4x4s and paper tape. Patient given supplies for 1 dressing change and instructed on care of site.

## 2015-02-25 NOTE — ED Notes (Signed)
Pt reports abscess right thigh near vaginal area that has been increasingly  Painful denies drainage , pt states pain worsened after warm compress applied

## 2015-02-25 NOTE — ED Provider Notes (Signed)
CSN: [id number]     Arrival date & time 02/25/15  [id number] History   First MD Initiated Contact with Patient 02/25/15 0455     Chief Complaint  Patient presents with  . Abscess    right leg/ thigh     (Consider location/radiation/quality/duration/timing/severity/associated sxs/prior Treatment) HPI  This is a 54 year old female with a three-day history of a perianal abscess. The onset was gradual. She tried warm soaks but this only made it worse. She is now having severe pain at the site, worse with palpation or movement. She denies fever or chills. She has not attempted to lance it herself.  Past Medical History  Diagnosis Date  . Diabetes mellitus   . Obese   . Thyroid disease     Hypothyroid  . Hypertension    Past Surgical History  Procedure Laterality Date  . Hip surgery      Bilateral replacement  . Oophorectomy      LSO and RSO  . Hernia repair    . Cervical cone biopsy  1990  . Abdominal hysterectomy  2011    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   Family History  Problem Relation Age of Onset  . Breast cancer Mother     Age 69  . Diabetes Mother   . Liver cancer Mother   . Lung cancer Father   . Colon cancer Neg Hx    Social History  Substance Use Topics  . Smoking status: Current Every Day Smoker -- 0.50 packs/day    Types: Cigarettes  . Smokeless tobacco: Never Used  . Alcohol Use: 0.0 oz/week    0 Standard drinks or equivalent per week     Comment: Rare   OB History    Gravida Para Term Preterm AB TAB SAB Ectopic Multiple Living   3 2 2  1     2      Review of Systems  All other systems reviewed and are negative.   Allergies  Review of patient's allergies indicates no known allergies.  Home Medications   Prior to Admission medications   Medication Sig Start Date End Date Taking? Authorizing Provider  ARMOUR THYROID PO Take by mouth. 180mg  po once daily    Historical Provider, MD  aspirin 81 MG tablet Take 81 mg by mouth daily.    Historical Provider, MD   canagliflozin (INVOKANA) 100 MG TABS tablet Take 100 mg by mouth.    Historical Provider, MD  glimepiride (AMARYL) 4 MG tablet Take 4 mg by mouth 2 (two) times daily.      Historical Provider, MD  lisinopril-hydrochlorothiazide (PRINZIDE,ZESTORETIC) 10-12.5 MG per tablet Take 1 tablet by mouth 2 (two) times daily.      Historical Provider, MD  omeprazole (PRILOSEC) 20 MG capsule Take 20 mg by mouth 2 (two) times a week.    Historical Provider, MD  sitaGLIPtan-metformin (JANUMET) 50-1000 MG per tablet Take 1 tablet by mouth 2 (two) times daily with a meal.      Historical Provider, MD   BP 136/76 mmHg  Pulse 112  Temp(Src) 97.6 F (36.4 C) (Oral)  Resp 18  SpO2 98%   Physical Exam  General: Well-developed, well-nourished female in no acute distress; appearance consistent with age of record HENT: normocephalic; atraumatic Eyes: Normal appearance Neck: supple Heart: regular rate and rhythm Lungs: clear to auscultation bilaterally Abdomen: soft; nondistended Extremities: No deformity; full range of motion; pulses normal Neurologic: Awake, alert and oriented; motor function intact in all extremities and symmetric; no facial droop  Skin: Warm and dry; perianal abscess at about 8:00 in the lithotomy position Psychiatric: Normal mood and affect    ED Course  Procedures (including critical care time)  INCISION AND DRAINAGE Performed by: Wynetta Fines Consent: Verbal consent obtained. Risks and benefits: risks, benefits and alternatives were discussed Type: abscess  Body area: Perianal region  Anesthesia: local infiltration  Incision was made with a scalpel.  Local anesthetic: lidocaine 2 % with epinephrine  Anesthetic total: 5 ml  Complexity: complex Blunt dissection to break up loculations; abscess cavity explored with finger, does not communicate with rectum   Drainage: purulent  Drainage amount: Copious   Packing material: 1/4 in iodoform gauze  Patient tolerance:  Patient tolerated the procedure well with no immediate complications.     MDM     Shanon Rosser, MD 02/25/15 [phone]

## 2015-08-18 ENCOUNTER — Other Ambulatory Visit: Payer: Self-pay

## 2015-08-18 DIAGNOSIS — Z1231 Encounter for screening mammogram for malignant neoplasm of breast: Secondary | ICD-10-CM

## 2015-08-19 ENCOUNTER — Ambulatory Visit
Admission: RE | Admit: 2015-08-19 | Discharge: 2015-08-19 | Disposition: A | Discharge status: Home / Self Care | Payer: 59 | Source: Ambulatory Visit

## 2015-08-19 DIAGNOSIS — Z1231 Encounter for screening mammogram for malignant neoplasm of breast: Secondary | ICD-10-CM

## 2016-05-06 DIAGNOSIS — K760 Fatty (change of) liver, not elsewhere classified: Secondary | ICD-10-CM | POA: Diagnosis not present

## 2016-05-06 DIAGNOSIS — E119 Type 2 diabetes mellitus without complications: Secondary | ICD-10-CM | POA: Diagnosis not present

## 2016-05-10 ENCOUNTER — Other Ambulatory Visit: Payer: Self-pay | Admitting: Internal Medicine

## 2016-05-10 DIAGNOSIS — F17208 Nicotine dependence, unspecified, with other nicotine-induced disorders: Secondary | ICD-10-CM

## 2016-05-28 ENCOUNTER — Encounter (HOSPITAL_BASED_OUTPATIENT_CLINIC_OR_DEPARTMENT_OTHER): Payer: Self-pay | Admitting: *Deleted

## 2016-05-28 ENCOUNTER — Emergency Department (HOSPITAL_BASED_OUTPATIENT_CLINIC_OR_DEPARTMENT_OTHER)
Admission: EM | Admit: 2016-05-28 | Discharge: 2016-05-28 | Disposition: A | Discharge status: Home / Self Care | Payer: 59 | Attending: Emergency Medicine | Admitting: Emergency Medicine

## 2016-05-28 DIAGNOSIS — Z7984 Long term (current) use of oral hypoglycemic drugs: Secondary | ICD-10-CM | POA: Insufficient documentation

## 2016-05-28 DIAGNOSIS — L02415 Cutaneous abscess of right lower limb: Secondary | ICD-10-CM | POA: Diagnosis not present

## 2016-05-28 DIAGNOSIS — E119 Type 2 diabetes mellitus without complications: Secondary | ICD-10-CM | POA: Diagnosis not present

## 2016-05-28 DIAGNOSIS — Z79899 Other long term (current) drug therapy: Secondary | ICD-10-CM | POA: Insufficient documentation

## 2016-05-28 DIAGNOSIS — Z7982 Long term (current) use of aspirin: Secondary | ICD-10-CM | POA: Insufficient documentation

## 2016-05-28 DIAGNOSIS — F17210 Nicotine dependence, cigarettes, uncomplicated: Secondary | ICD-10-CM | POA: Insufficient documentation

## 2016-05-28 DIAGNOSIS — E039 Hypothyroidism, unspecified: Secondary | ICD-10-CM | POA: Insufficient documentation

## 2016-05-28 DIAGNOSIS — L0291 Cutaneous abscess, unspecified: Secondary | ICD-10-CM

## 2016-05-28 DIAGNOSIS — I10 Essential (primary) hypertension: Secondary | ICD-10-CM | POA: Insufficient documentation

## 2016-05-28 DIAGNOSIS — L02416 Cutaneous abscess of left lower limb: Secondary | ICD-10-CM | POA: Diagnosis not present

## 2016-05-28 MED ORDER — IBUPROFEN 600 MG PO TABS: 600.0000 mg | ORAL_TABLET | Freq: Four times a day (QID) | ORAL | 0 refills | Status: AC | PRN

## 2016-05-28 MED ORDER — LIDOCAINE-EPINEPHRINE (PF) 2 %-1:200000 IJ SOLN
INTRAMUSCULAR | Status: AC
  Filled 2016-05-28: qty 20

## 2016-05-28 MED ORDER — LIDOCAINE-EPINEPHRINE (PF) 2 %-1:200000 IJ SOLN
10.0000 mL | Freq: Once | INTRAMUSCULAR | Status: AC
  Administered 2016-05-28: 5 mL

## 2016-05-28 MED ORDER — CEPHALEXIN 500 MG PO CAPS: 500.0000 mg | ORAL_CAPSULE | Freq: Four times a day (QID) | ORAL | 0 refills | Status: DC

## 2016-05-28 MED ORDER — SULFAMETHOXAZOLE-TRIMETHOPRIM 800-160 MG PO TABS: 1.0000 | ORAL_TABLET | Freq: Two times a day (BID) | ORAL | 0 refills | Status: AC

## 2016-05-28 MED FILL — IBUPROFEN 600 MG TABLET: 600 | 7 days supply | Qty: 30 | Fill #0

## 2016-05-28 MED FILL — SULFAMETHOXAZOLE/TMP DS TAB: 800-160 | 7 days supply | Qty: 14 | Fill #0

## 2016-05-28 MED FILL — CEPHALEXIN 500 MG CAPSULE: 500 | 7 days supply | Qty: 28 | Fill #0

## 2016-05-28 NOTE — ED Triage Notes (Signed)
Pt reports abscess to right upper thigh x 1 week. Has been using warm compress without relief

## 2016-05-28 NOTE — ED Notes (Signed)
ED Provider at bedside. 

## 2016-05-28 NOTE — ED Provider Notes (Addendum)
[facility] Provider Note   CSN: [id number] Arrival date & time: 05/28/16  [id number]     History   Chief Complaint Chief Complaint  Patient presents with  . Abscess    HPI Margaret Carey is a 56 y.o. female.  HPI  Patient is a 56 year old female with history of diabetes and hypertension who presents to the ED with complaint of abscess, onset one week. Patient reports having a gradually worsening abscess to her left inner thigh for the past week. She states he used been using warm compresses at home without relief but notes the abscess has continued to get larger. Denies drainage. Endorses associated pain and redness. Patient reports history of prior abscesses. Denies any recent antibiotic use. Denies fever, chills, abdominal pain, vomiting, urinary symptoms, vaginal bleeding or discharge, rectal pain. Patient denies taking any medications at home for her pain.  Past Medical History:  Diagnosis Date  . Diabetes mellitus   . Hypertension   . Obese   . Thyroid disease    Hypothyroid    Patient Active Problem List   Diagnosis Date Noted  . Thyroid disease   . Hypertension     Past Surgical History:  Procedure Laterality Date  . 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 CERVICAL CONE BIOPSY  1990  . HERNIA REPAIR    . HIP SURGERY     Bilateral replacement  . OOPHORECTOMY     LSO and RSO    OB History    Gravida Para Term Preterm AB Living   3 2 2   1 2    SAB TAB Ectopic Multiple Live Births                   Home Medications    Prior to Admission medications   Medication Sig Start Date End Date Taking? Authorizing Provider  ARMOUR THYROID PO Take by mouth. 180mg  po once daily   Yes Historical Provider, MD  aspirin 81 MG tablet Take 81 mg by mouth daily.   Yes Historical Provider, MD  canagliflozin (INVOKANA) 100 MG TABS tablet Take 100 mg by mouth.   Yes Historical Provider, MD  glimepiride (AMARYL) 4 MG tablet Take 4 mg by mouth 2  (two) times daily.     Yes Historical Provider, MD  lisinopril-hydrochlorothiazide (PRINZIDE,ZESTORETIC) 10-12.5 MG per tablet Take 1 tablet by mouth 2 (two) times daily.     Yes Historical Provider, MD  omeprazole (PRILOSEC) 20 MG capsule Take 20 mg by mouth 2 (two) times a week.   Yes Historical Provider, MD  sitaGLIPtan-metformin (JANUMET) 50-1000 MG per tablet Take 1 tablet by mouth 2 (two) times daily with a meal.     Yes Historical Provider, MD  cephALEXin (KEFLEX) 500 MG capsule Take 1 capsule (500 mg total) by mouth 4 (four) times daily. 05/28/16   Nona Dell, PA-C  HYDROcodone-acetaminophen [facility]) 5-325 MG tablet Take 1-2 tablets by mouth every 6 (six) hours as needed (for pain; may cause constipation). 02/25/15   John Molpus, MD  ibuprofen (ADVIL,MOTRIN) 600 MG tablet Take 1 tablet (600 mg total) by mouth every 6 (six) hours as needed. 05/28/16   Nona Dell, PA-C  sulfamethoxazole-trimethoprim (BACTRIM DS,SEPTRA DS) 800-160 MG tablet Take 1 tablet by mouth 2 (two) times daily. 05/28/16 06/04/16  Nona Dell, PA-C    Family History Family History  Problem Relation Age of Onset  . Breast cancer Mother     Age 74  .  Diabetes Mother   . Liver cancer Mother   . Lung cancer Father   . Colon cancer Neg Hx     Social History Social History  Substance Use Topics  . Smoking status: Current Every Day Smoker    Packs/day: 0.50    Types: Cigarettes  . Smokeless tobacco: Never Used  . Alcohol use 0.0 oz/week     Comment: Rare     Allergies   Patient has no known allergies.   Review of Systems Review of Systems  Skin: Positive for color change (redness).       Abscess  All other systems reviewed and are negative.    Physical Exam Updated Vital Signs BP 167/89 (BP Location: Right Arm)   Pulse 112   Temp 98.2 F (36.8 C) (Oral)   Resp 18   Ht 5' 3.5" (1.613 m)   Wt 112 kg   SpO2 99%   BMI 43.07 kg/m   Physical Exam  Constitutional:  She is oriented to person, place, and time. She appears well-developed and well-nourished. No distress.  HENT:  Head: Normocephalic and atraumatic.  Eyes: Conjunctivae and EOM are normal. Right eye exhibits no discharge. Left eye exhibits no discharge. No scleral icterus.  Neck: Normal range of motion. Neck supple.  Cardiovascular: Regular rhythm, normal heart sounds and intact distal pulses.   Mildly tachycardic, HR 102  Pulmonary/Chest: Effort normal and breath sounds normal. No respiratory distress. She has no wheezes. She has no rales. She exhibits no tenderness.  Abdominal: Soft. Bowel sounds are normal. She exhibits no distension and no mass. There is no tenderness. There is no rebound and no guarding. No hernia.  Musculoskeletal: She exhibits no edema.  Neurological: She is alert and oriented to person, place, and time.  Skin: Skin is warm and dry. She is not diaphoretic.  6x4cm area of swelling noted to right inner proximal thigh with associated erythema, induration and fluctuance. No drainage.   Nursing note and vitals reviewed.    ED Treatments / Results  Labs (all labs ordered are listed, but only abnormal results are displayed) Labs Reviewed - No data to display  EKG  EKG Interpretation None       Radiology No results found.  Procedures .Marland KitchenIncision and Drainage Date/Time: 06/15/2016 11:12 AM Performed by: Nona Dell Authorized by: Nona Dell   Consent:    Consent obtained:  Verbal   Consent given by:  Patient Location:    Type:  Abscess   Size:  2x3cm   Location: left inner thigh. Pre-procedure details:    Skin preparation:  Betadine Anesthesia (see MAR for exact dosages):    Anesthesia method:  Local infiltration   Local anesthetic:  Lidocaine 2% WITH epi Procedure type:    Complexity:  Simple Procedure details:    Incision types:  Single straight   Incision depth:  Dermal   Scalpel blade:  11   Wound management:  Probed  and deloculated and irrigated with saline   Drainage:  Bloody and purulent   Drainage amount:  Moderate   Wound treatment:  Wound left open   Packing materials:  None Post-procedure details:    Patient tolerance of procedure:  Tolerated well, no immediate complications   (including critical care time)  ULTRASOUND LIMITED SOFT TISSUE/ MUSCULOSKELETAL:  Indication: Abscess Linear probe used to evaluate area of interest in two planes. Findings:  2x3cm area of fluid with surrounding inflammatory changes to soft tissue consistent with abscess Performed by: Elmyra Ricks  Modena Jansky, PA-C Images saved electronically  Medications Ordered in ED Medications  lidocaine-EPINEPHrine (XYLOCAINE W/EPI) 2 %-1:200000 (PF) injection 10 mL (5 mLs Infiltration Given by Other 05/28/16 1100)     Initial Impression / Assessment and Plan / ED Course  I have reviewed the triage vital signs and the nursing notes.  Pertinent labs & imaging results that were available during my care of the patient were reviewed by me and considered in my medical decision making (see chart for details).     Patient with skin abscess amenable to incision and drainage.  Abscess was not large enough to warrant packing or drain,  wound recheck in 2 days if not improved. Encouraged home warm soaks and flushing.  Mild signs of cellulitis in surrounding skin.  Will d/c home with antibiotic therapy due to pt with hx of DM and prior abscesses. Discussed return precautions.  Final Clinical Impressions(s) / ED Diagnoses   Final diagnoses:  Abscess    New Prescriptions New Prescriptions   CEPHALEXIN (KEFLEX) 500 MG CAPSULE    Take 1 capsule (500 mg total) by mouth 4 (four) times daily.   IBUPROFEN (ADVIL,MOTRIN) 600 MG TABLET    Take 1 tablet (600 mg total) by mouth every 6 (six) hours as needed.   SULFAMETHOXAZOLE-TRIMETHOPRIM (BACTRIM DS,SEPTRA DS) 800-160 MG TABLET    Take 1 tablet by mouth 2 (two) times daily.     Chesley Noon  [place], [place] 05/28/16 [place] Liu, MD 05/28/16 [place], [place] 06/15/16 [place] Liu, MD 06/16/16 1047

## 2016-05-28 NOTE — Discharge Instructions (Signed)
Take your medications as prescribed until completed. I recommend continuing to apply warm compresses to the area for the next few days. You may also take ibuprofen as prescribed as needed for pain relief. Follow-up with your primary care provider in the next 3 days of your symptoms are not improved. Please return to the Emergency Department if symptoms worsen or new onset of fever, swelling, redness, warmth, abdominal pain, vomiting.

## 2016-05-28 NOTE — ED Notes (Signed)
Pt directed to pharmacy to pick up RX 

## 2016-08-23 ENCOUNTER — Other Ambulatory Visit: Payer: Self-pay | Admitting: Internal Medicine

## 2016-08-23 DIAGNOSIS — Z1231 Encounter for screening mammogram for malignant neoplasm of breast: Secondary | ICD-10-CM

## 2016-08-23 DIAGNOSIS — E119 Type 2 diabetes mellitus without complications: Secondary | ICD-10-CM | POA: Diagnosis not present

## 2016-08-23 DIAGNOSIS — H25013 Cortical age-related cataract, bilateral: Secondary | ICD-10-CM | POA: Diagnosis not present

## 2016-08-23 DIAGNOSIS — H35363 Drusen (degenerative) of macula, bilateral: Secondary | ICD-10-CM | POA: Diagnosis not present

## 2016-08-23 DIAGNOSIS — H2513 Age-related nuclear cataract, bilateral: Secondary | ICD-10-CM | POA: Diagnosis not present

## 2016-08-24 ENCOUNTER — Ambulatory Visit
Admission: RE | Admit: 2016-08-24 | Discharge: 2016-08-24 | Disposition: A | Discharge status: Home / Self Care | Payer: 59 | Source: Ambulatory Visit | Attending: Internal Medicine | Admitting: Internal Medicine

## 2016-08-24 DIAGNOSIS — Z1231 Encounter for screening mammogram for malignant neoplasm of breast: Secondary | ICD-10-CM | POA: Diagnosis not present

## 2016-08-27 DIAGNOSIS — E038 Other specified hypothyroidism: Secondary | ICD-10-CM | POA: Diagnosis not present

## 2016-08-27 DIAGNOSIS — Z0000 Encounter for general adult medical examination without abnormal findings: Secondary | ICD-10-CM | POA: Diagnosis not present

## 2016-09-03 DIAGNOSIS — Z0000 Encounter for general adult medical examination without abnormal findings: Secondary | ICD-10-CM | POA: Diagnosis not present

## 2016-09-03 DIAGNOSIS — Z1212 Encounter for screening for malignant neoplasm of rectum: Secondary | ICD-10-CM | POA: Diagnosis not present

## 2016-09-03 DIAGNOSIS — K760 Fatty (change of) liver, not elsewhere classified: Secondary | ICD-10-CM | POA: Diagnosis not present

## 2016-09-03 DIAGNOSIS — Z1389 Encounter for screening for other disorder: Secondary | ICD-10-CM | POA: Diagnosis not present

## 2016-12-01 ENCOUNTER — Ambulatory Visit: Payer: 59

## 2016-12-02 ENCOUNTER — Ambulatory Visit: Payer: 59

## 2016-12-10 ENCOUNTER — Ambulatory Visit
Admission: RE | Admit: 2016-12-10 | Discharge: 2016-12-10 | Disposition: A | Discharge status: Home / Self Care | Payer: 59 | Source: Ambulatory Visit | Attending: Internal Medicine | Admitting: Internal Medicine

## 2016-12-10 DIAGNOSIS — F17208 Nicotine dependence, unspecified, with other nicotine-induced disorders: Secondary | ICD-10-CM

## 2016-12-30 DIAGNOSIS — E119 Type 2 diabetes mellitus without complications: Secondary | ICD-10-CM | POA: Diagnosis not present

## 2016-12-30 DIAGNOSIS — Z23 Encounter for immunization: Secondary | ICD-10-CM | POA: Diagnosis not present

## 2017-05-06 DIAGNOSIS — E119 Type 2 diabetes mellitus without complications: Secondary | ICD-10-CM | POA: Diagnosis not present

## 2017-05-06 DIAGNOSIS — K760 Fatty (change of) liver, not elsewhere classified: Secondary | ICD-10-CM | POA: Diagnosis not present

## 2017-08-16 ENCOUNTER — Other Ambulatory Visit: Payer: Self-pay | Admitting: Internal Medicine

## 2017-08-16 DIAGNOSIS — Z1231 Encounter for screening mammogram for malignant neoplasm of breast: Secondary | ICD-10-CM

## 2017-08-25 DIAGNOSIS — H25013 Cortical age-related cataract, bilateral: Secondary | ICD-10-CM | POA: Diagnosis not present

## 2017-08-25 DIAGNOSIS — H35033 Hypertensive retinopathy, bilateral: Secondary | ICD-10-CM | POA: Diagnosis not present

## 2017-08-25 DIAGNOSIS — H35361 Drusen (degenerative) of macula, right eye: Secondary | ICD-10-CM | POA: Diagnosis not present

## 2017-08-25 DIAGNOSIS — H2513 Age-related nuclear cataract, bilateral: Secondary | ICD-10-CM | POA: Diagnosis not present

## 2017-08-25 DIAGNOSIS — E119 Type 2 diabetes mellitus without complications: Secondary | ICD-10-CM | POA: Diagnosis not present

## 2017-09-02 DIAGNOSIS — R82998 Other abnormal findings in urine: Secondary | ICD-10-CM | POA: Diagnosis not present

## 2017-09-02 DIAGNOSIS — E038 Other specified hypothyroidism: Secondary | ICD-10-CM | POA: Diagnosis not present

## 2017-09-02 DIAGNOSIS — Z0000 Encounter for general adult medical examination without abnormal findings: Secondary | ICD-10-CM | POA: Diagnosis not present

## 2017-09-05 ENCOUNTER — Ambulatory Visit
Admission: RE | Admit: 2017-09-05 | Discharge: 2017-09-05 | Disposition: A | Discharge status: Home / Self Care | Payer: 59 | Source: Ambulatory Visit | Attending: Internal Medicine | Admitting: Internal Medicine

## 2017-09-05 DIAGNOSIS — Z1231 Encounter for screening mammogram for malignant neoplasm of breast: Secondary | ICD-10-CM

## 2017-09-09 DIAGNOSIS — Z0000 Encounter for general adult medical examination without abnormal findings: Secondary | ICD-10-CM | POA: Diagnosis not present

## 2017-09-09 DIAGNOSIS — K760 Fatty (change of) liver, not elsewhere classified: Secondary | ICD-10-CM | POA: Diagnosis not present

## 2017-09-09 DIAGNOSIS — Z1212 Encounter for screening for malignant neoplasm of rectum: Secondary | ICD-10-CM | POA: Diagnosis not present

## 2017-09-09 DIAGNOSIS — E1165 Type 2 diabetes mellitus with hyperglycemia: Secondary | ICD-10-CM | POA: Diagnosis not present

## 2017-09-09 DIAGNOSIS — Z1389 Encounter for screening for other disorder: Secondary | ICD-10-CM | POA: Diagnosis not present

## 2018-01-21 ENCOUNTER — Inpatient Hospital Stay (HOSPITAL_BASED_OUTPATIENT_CLINIC_OR_DEPARTMENT_OTHER)
Admission: EM | Admit: 2018-01-21 | Discharge: 2018-01-25 | DRG: 853 | Disposition: A | Discharge status: Home / Self Care | Payer: 59 | Attending: Internal Medicine | Admitting: Internal Medicine

## 2018-01-21 ENCOUNTER — Emergency Department (HOSPITAL_BASED_OUTPATIENT_CLINIC_OR_DEPARTMENT_OTHER): Payer: 59

## 2018-01-21 ENCOUNTER — Other Ambulatory Visit: Payer: Self-pay

## 2018-01-21 ENCOUNTER — Encounter (HOSPITAL_BASED_OUTPATIENT_CLINIC_OR_DEPARTMENT_OTHER): Payer: Self-pay | Admitting: Emergency Medicine

## 2018-01-21 ENCOUNTER — Inpatient Hospital Stay (HOSPITAL_COMMUNITY): Payer: 59

## 2018-01-21 DIAGNOSIS — N179 Acute kidney failure, unspecified: Secondary | ICD-10-CM | POA: Diagnosis not present

## 2018-01-21 DIAGNOSIS — E861 Hypovolemia: Secondary | ICD-10-CM | POA: Diagnosis present

## 2018-01-21 DIAGNOSIS — R000 Tachycardia, unspecified: Secondary | ICD-10-CM | POA: Diagnosis not present

## 2018-01-21 DIAGNOSIS — Z79899 Other long term (current) drug therapy: Secondary | ICD-10-CM

## 2018-01-21 DIAGNOSIS — Z6841 Body Mass Index (BMI) 40.0 and over, adult: Secondary | ICD-10-CM

## 2018-01-21 DIAGNOSIS — Z7984 Long term (current) use of oral hypoglycemic drugs: Secondary | ICD-10-CM | POA: Diagnosis not present

## 2018-01-21 DIAGNOSIS — E860 Dehydration: Secondary | ICD-10-CM | POA: Diagnosis present

## 2018-01-21 DIAGNOSIS — B958 Unspecified staphylococcus as the cause of diseases classified elsewhere: Secondary | ICD-10-CM | POA: Diagnosis not present

## 2018-01-21 DIAGNOSIS — R6521 Severe sepsis with septic shock: Secondary | ICD-10-CM | POA: Diagnosis not present

## 2018-01-21 DIAGNOSIS — B9561 Methicillin susceptible Staphylococcus aureus infection as the cause of diseases classified elsewhere: Secondary | ICD-10-CM | POA: Diagnosis not present

## 2018-01-21 DIAGNOSIS — E1169 Type 2 diabetes mellitus with other specified complication: Secondary | ICD-10-CM | POA: Diagnosis not present

## 2018-01-21 DIAGNOSIS — E079 Disorder of thyroid, unspecified: Secondary | ICD-10-CM | POA: Diagnosis not present

## 2018-01-21 DIAGNOSIS — R509 Fever, unspecified: Secondary | ICD-10-CM | POA: Diagnosis not present

## 2018-01-21 DIAGNOSIS — E118 Type 2 diabetes mellitus with unspecified complications: Secondary | ICD-10-CM | POA: Diagnosis not present

## 2018-01-21 DIAGNOSIS — E871 Hypo-osmolality and hyponatremia: Secondary | ICD-10-CM | POA: Diagnosis present

## 2018-01-21 DIAGNOSIS — L02212 Cutaneous abscess of back [any part, except buttock]: Secondary | ICD-10-CM | POA: Diagnosis present

## 2018-01-21 DIAGNOSIS — I10 Essential (primary) hypertension: Secondary | ICD-10-CM | POA: Diagnosis present

## 2018-01-21 DIAGNOSIS — Z882 Allergy status to sulfonamides status: Secondary | ICD-10-CM

## 2018-01-21 DIAGNOSIS — Z7982 Long term (current) use of aspirin: Secondary | ICD-10-CM | POA: Diagnosis not present

## 2018-01-21 DIAGNOSIS — L02214 Cutaneous abscess of groin: Secondary | ICD-10-CM | POA: Diagnosis not present

## 2018-01-21 DIAGNOSIS — E1165 Type 2 diabetes mellitus with hyperglycemia: Secondary | ICD-10-CM | POA: Diagnosis present

## 2018-01-21 DIAGNOSIS — F17210 Nicotine dependence, cigarettes, uncomplicated: Secondary | ICD-10-CM | POA: Diagnosis present

## 2018-01-21 DIAGNOSIS — L0291 Cutaneous abscess, unspecified: Secondary | ICD-10-CM | POA: Diagnosis not present

## 2018-01-21 DIAGNOSIS — N390 Urinary tract infection, site not specified: Secondary | ICD-10-CM | POA: Diagnosis not present

## 2018-01-21 DIAGNOSIS — E119 Type 2 diabetes mellitus without complications: Secondary | ICD-10-CM

## 2018-01-21 DIAGNOSIS — K219 Gastro-esophageal reflux disease without esophagitis: Secondary | ICD-10-CM | POA: Diagnosis not present

## 2018-01-21 DIAGNOSIS — E669 Obesity, unspecified: Secondary | ICD-10-CM | POA: Diagnosis present

## 2018-01-21 DIAGNOSIS — E039 Hypothyroidism, unspecified: Secondary | ICD-10-CM | POA: Diagnosis present

## 2018-01-21 DIAGNOSIS — A419 Sepsis, unspecified organism: Secondary | ICD-10-CM | POA: Diagnosis not present

## 2018-01-21 LAB — COMPREHENSIVE METABOLIC PANEL
ALBUMIN: 3.1 g/dL — AB (ref 3.5–5.0)
ALT: 31 U/L (ref 0–44)
AST: 28 U/L (ref 15–41)
Alkaline Phosphatase: 75 U/L (ref 38–126)
Anion gap: 14 (ref 5–15)
BUN: 40 mg/dL — ABNORMAL HIGH (ref 6–20)
CHLORIDE: 93 mmol/L — AB (ref 98–111)
CO2: 20 mmol/L — AB (ref 22–32)
Calcium: 9 mg/dL (ref 8.9–10.3)
Creatinine, Ser: 2.14 mg/dL — ABNORMAL HIGH (ref 0.44–1.00)
GFR calc non Af Amer: 24 mL/min — ABNORMAL LOW (ref >60)
GFR, EST AFRICAN AMERICAN: 28 mL/min — AB (ref >60)
GLUCOSE: 348 mg/dL — AB (ref 70–99)
Potassium: 3.7 mmol/L (ref 3.5–5.1)
SODIUM: 127 mmol/L — AB (ref 135–145)
Total Bilirubin: 1.5 mg/dL — ABNORMAL HIGH (ref 0.3–1.2)
Total Protein: 7.4 g/dL (ref 6.5–8.1)

## 2018-01-21 LAB — URINALYSIS, ROUTINE W REFLEX MICROSCOPIC
Glucose, UA: 100 mg/dL — AB
Ketones, ur: NEGATIVE mg/dL
Nitrite: NEGATIVE
PROTEIN: 30 mg/dL — AB
Specific Gravity, Urine: >1.03 — ABNORMAL HIGH (ref 1.005–1.030)
pH: 5 (ref 5.0–8.0)

## 2018-01-21 LAB — CBC WITH DIFFERENTIAL/PLATELET
Abs Immature Granulocytes: 0.15 10*3/uL — ABNORMAL HIGH (ref 0.00–0.07)
BASOS PCT: 0 %
Basophils Absolute: 0 10*3/uL (ref 0.0–0.1)
EOS ABS: 0.4 10*3/uL (ref 0.0–0.5)
EOS PCT: 3 %
HCT: 37.5 % (ref 36.0–46.0)
Hemoglobin: 13 g/dL (ref 12.0–15.0)
Immature Granulocytes: 1 %
Lymphocytes Relative: 4 %
Lymphs Abs: 0.5 10*3/uL — ABNORMAL LOW (ref 0.7–4.0)
MCH: 32.1 pg (ref 26.0–34.0)
MCHC: 34.7 g/dL (ref 30.0–36.0)
MCV: 92.6 fL (ref 80.0–100.0)
MONO ABS: 1.2 10*3/uL — AB (ref 0.1–1.0)
Monocytes Relative: 9 %
Neutro Abs: 11.1 10*3/uL — ABNORMAL HIGH (ref 1.7–7.7)
Neutrophils Relative %: 83 %
PLATELETS: 158 10*3/uL (ref 150–400)
RBC: 4.05 MIL/uL (ref 3.87–5.11)
RDW: 12.7 % (ref 11.5–15.5)
WBC: 13.4 10*3/uL — AB (ref 4.0–10.5)
nRBC: 0 % (ref 0.0–0.2)

## 2018-01-21 LAB — I-STAT CG4 LACTIC ACID, ED
LACTIC ACID, VENOUS: 1.62 mmol/L (ref 0.5–1.9)
Lactic Acid, Venous: 2.4 mmol/L (ref 0.5–1.9)

## 2018-01-21 LAB — GLUCOSE, CAPILLARY
GLUCOSE-CAPILLARY: 297 mg/dL — AB (ref 70–99)
GLUCOSE-CAPILLARY: 300 mg/dL — AB (ref 70–99)
GLUCOSE-CAPILLARY: 255 mg/dL — AB (ref 70–99)

## 2018-01-21 LAB — HEMOGLOBIN A1C
Hgb A1c MFr Bld: 8.9 % — ABNORMAL HIGH (ref 4.8–5.6)
Mean Plasma Glucose: 208.73 mg/dL

## 2018-01-21 LAB — TSH: TSH: 1.65 u[IU]/mL (ref 0.350–4.500)

## 2018-01-21 LAB — URINALYSIS, MICROSCOPIC (REFLEX)
Squamous Epithelial / LPF: >50 (ref 0–5)
WBC, UA: >50 WBC/hpf (ref 0–5)

## 2018-01-21 LAB — MRSA PCR SCREENING: MRSA BY PCR: NEGATIVE

## 2018-01-21 MED ORDER — NICOTINE 21 MG/24HR TD PT24
21.0000 mg | MEDICATED_PATCH | Freq: Every day | TRANSDERMAL | Status: DC
  Administered 2018-01-21 – 2018-01-25 (×5): 21 mg via TRANSDERMAL
  Filled 2018-01-21 (×5): qty 1

## 2018-01-21 MED ORDER — ACETAMINOPHEN 325 MG PO TABS
650.0000 mg | ORAL_TABLET | Freq: Four times a day (QID) | ORAL | Status: DC | PRN
  Administered 2018-01-21: 650 mg via ORAL
  Filled 2018-01-21: qty 2

## 2018-01-21 MED ORDER — VANCOMYCIN HCL IN DEXTROSE 1-5 GM/200ML-% IV SOLN
1000.0000 mg | Freq: Once | INTRAVENOUS | Status: AC
  Administered 2018-01-21: 1000 mg via INTRAVENOUS
  Filled 2018-01-21: qty 200

## 2018-01-21 MED ORDER — VANCOMYCIN HCL IN DEXTROSE 1-5 GM/200ML-% IV SOLN
1000.0000 mg | Freq: Once | INTRAVENOUS | Status: AC
  Administered 2018-01-21: 1000 mg via INTRAVENOUS

## 2018-01-21 MED ORDER — ONDANSETRON HCL 4 MG/2ML IJ SOLN: 4.0000 mg | Freq: Four times a day (QID) | INTRAMUSCULAR | Status: DC | PRN

## 2018-01-21 MED ORDER — VANCOMYCIN VARIABLE DOSE PER UNSTABLE RENAL FUNCTION (PHARMACIST DOSING): Status: DC

## 2018-01-21 MED ORDER — PHENAZOPYRIDINE HCL 100 MG PO TABS
100.0000 mg | ORAL_TABLET | Freq: Two times a day (BID) | ORAL | Status: DC
  Administered 2018-01-21 – 2018-01-24 (×7): 100 mg via ORAL
  Filled 2018-01-21 (×8): qty 1

## 2018-01-21 MED ORDER — ACETAMINOPHEN 500 MG PO TABS
1000.0000 mg | ORAL_TABLET | Freq: Once | ORAL | Status: AC
  Administered 2018-01-21: 1000 mg via ORAL
  Filled 2018-01-21: qty 2

## 2018-01-21 MED ORDER — SODIUM CHLORIDE 0.9 % IV BOLUS
500.0000 mL | Freq: Once | INTRAVENOUS | Status: AC
  Administered 2018-01-21: 500 mL via INTRAVENOUS

## 2018-01-21 MED ORDER — POLYETHYLENE GLYCOL 3350 17 G PO PACK: 17.0000 g | PACK | Freq: Every day | ORAL | Status: DC | PRN

## 2018-01-21 MED ORDER — ONDANSETRON HCL 4 MG PO TABS: 4.0000 mg | ORAL_TABLET | Freq: Four times a day (QID) | ORAL | Status: DC | PRN

## 2018-01-21 MED ORDER — SODIUM CHLORIDE 0.9 % IV BOLUS
1000.0000 mL | Freq: Once | INTRAVENOUS | Status: AC
  Administered 2018-01-21: 1000 mL via INTRAVENOUS

## 2018-01-21 MED ORDER — SODIUM CHLORIDE 0.9 % IV SOLN
2.0000 g | INTRAVENOUS | Status: DC
  Administered 2018-01-21: 2 g via INTRAVENOUS
  Filled 2018-01-21: qty 20

## 2018-01-21 MED ORDER — HEPARIN SODIUM (PORCINE) 5000 UNIT/ML IJ SOLN
5000.0000 [IU] | Freq: Three times a day (TID) | INTRAMUSCULAR | Status: DC
  Administered 2018-01-21 – 2018-01-24 (×8): 5000 [IU] via SUBCUTANEOUS
  Filled 2018-01-21 (×9): qty 1

## 2018-01-21 MED ORDER — SODIUM CHLORIDE 0.9 % IV BOLUS
2000.0000 mL | Freq: Once | INTRAVENOUS | Status: AC
  Administered 2018-01-21: 2000 mL via INTRAVENOUS

## 2018-01-21 MED ORDER — ASPIRIN EC 81 MG PO TBEC
81.0000 mg | DELAYED_RELEASE_TABLET | Freq: Every day | ORAL | Status: DC
  Administered 2018-01-21 – 2018-01-25 (×5): 81 mg via ORAL
  Filled 2018-01-21 (×5): qty 1

## 2018-01-21 MED ORDER — ALUM & MAG HYDROXIDE-SIMETH 200-200-20 MG/5ML PO SUSP
30.0000 mL | ORAL | Status: DC | PRN
  Administered 2018-01-21: 30 mL via ORAL
  Filled 2018-01-21: qty 30

## 2018-01-21 MED ORDER — VANCOMYCIN HCL IN DEXTROSE 1-5 GM/200ML-% IV SOLN
1000.0000 mg | Freq: Once | INTRAVENOUS | Status: DC
  Filled 2018-01-21: qty 200

## 2018-01-21 MED ORDER — INSULIN ASPART 100 UNIT/ML ~~LOC~~ SOLN
0.0000 [IU] | Freq: Three times a day (TID) | SUBCUTANEOUS | Status: DC
  Administered 2018-01-21: 8 [IU] via SUBCUTANEOUS
  Administered 2018-01-22: 5 [IU] via SUBCUTANEOUS
  Administered 2018-01-22: 8 [IU] via SUBCUTANEOUS
  Administered 2018-01-23 (×2): 3 [IU] via SUBCUTANEOUS
  Administered 2018-01-23: 5 [IU] via SUBCUTANEOUS
  Administered 2018-01-24 – 2018-01-25 (×5): 3 [IU] via SUBCUTANEOUS

## 2018-01-21 MED ORDER — INSULIN ASPART 100 UNIT/ML ~~LOC~~ SOLN
0.0000 [IU] | Freq: Every day | SUBCUTANEOUS | Status: DC
  Administered 2018-01-21: 3 [IU] via SUBCUTANEOUS
  Administered 2018-01-22: 4 [IU] via SUBCUTANEOUS
  Administered 2018-01-23 – 2018-01-24 (×2): 2 [IU] via SUBCUTANEOUS

## 2018-01-21 MED ORDER — ACETAMINOPHEN 650 MG RE SUPP: 650.0000 mg | Freq: Four times a day (QID) | RECTAL | Status: DC | PRN

## 2018-01-21 MED ORDER — HYDROCODONE-ACETAMINOPHEN 5-325 MG PO TABS
1.0000 | ORAL_TABLET | Freq: Four times a day (QID) | ORAL | Status: DC | PRN
  Administered 2018-01-21 – 2018-01-23 (×2): 2 via ORAL
  Filled 2018-01-21 (×2): qty 2

## 2018-01-21 MED ORDER — SODIUM CHLORIDE 0.9 % IV SOLN
INTRAVENOUS | Status: DC
  Administered 2018-01-21 – 2018-01-24 (×6): via INTRAVENOUS

## 2018-01-21 MED ORDER — SODIUM CHLORIDE 0.9 % IV SOLN
2.0000 g | INTRAVENOUS | Status: DC
  Administered 2018-01-22 – 2018-01-23 (×2): 2 g via INTRAVENOUS
  Filled 2018-01-21 (×2): qty 2

## 2018-01-21 NOTE — ED Triage Notes (Signed)
Pt states she is been having an abscess on her back for the past 2 days getting very painful to touch, having fever and fatigue.

## 2018-01-21 NOTE — H&P (Addendum)
History and Physical    [name] VEL:[id number] DOB: [birth year] DOA: 01/21/2018  I have briefly reviewed the patient's prior medical records in [place]  PCP: Shon Baton, MD  Patient coming from: home  Chief Complaint: fevers/chills, boil on back  HPI: Margaret Carey is a 57 y.o. female with medical history significant of hypothyroidism, diabetes mellitus, hypertension, obesity, who presents to the hospital with chief complaint of fever and chills and general weakness for the last 2 to 3 days.  She is also been complaining for the past week of a boil on her back which has been increasing in size and has becoming more tender.  She also has noticed that over the last 24 hours her urine has become a little bit darker and the smell has changed.  She denies any chest pain, denies any shortness of breath.  She denies any abdominal pain, denies any nausea or vomiting.  She denies any lightheadedness or dizziness.  No numbness or tingling.  ED Course: In the emergency room she was febrile up to 102.4, heart rate is in the 1 teens, and she did have few episodes of hypotension with blood pressure as low as 70s which responded to fluids.  She was asymptomatic during that time.  Blood work reveals sodium 127, creatinine of 2.1, lactic acid was initially 2.4 but normalized with fluids, white count 13.4.  Bedside ultrasound per EDP did not show any abscess in her boil on her back.  Urinalysis was evidence of a UTI.  She was given vancomycin and ceftriaxone and we are asked to admit  Review of Systems: As per HPI otherwise 10 point review of systems negative.   Past Medical History:  Diagnosis Date  . Diabetes mellitus   . Hypertension   . Obese   . Thyroid disease    Hypothyroid    Past Surgical History:  Procedure Laterality Date  . 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 CERVICAL CONE BIOPSY  1990  . HERNIA REPAIR    . HIP SURGERY     Bilateral replacement  .  OOPHORECTOMY     LSO and RSO     reports that she has been smoking cigarettes. She has been smoking about 0.50 packs per day. She has never used smokeless tobacco. She reports that she drinks alcohol. She reports that she does not use drugs.  No Known Allergies  Family History  Problem Relation Age of Onset  . Breast cancer Mother        Age 63  . Diabetes Mother   . Liver cancer Mother   . Lung cancer Father   . Colon cancer Neg Hx     Prior to Admission medications   Medication Sig Start Date End Date Taking? Authorizing Provider  ARMOUR THYROID PO Take by mouth. 180mg  po once daily    [provider]  aspirin 81 MG tablet Take 81 mg by mouth daily.    [provider]  canagliflozin (INVOKANA) 100 MG TABS tablet Take 100 mg by mouth.    [provider]  cephALEXin (KEFLEX) 500 MG capsule Take 1 capsule (500 mg total) by mouth 4 (four) times daily. 05/28/16   Nona Dell, PA-C  glimepiride (AMARYL) 4 MG tablet Take 4 mg by mouth 2 (two) times daily.      [provider]  HYDROcodone-acetaminophen (NORCO) 5-325 MG tablet Take 1-2 tablets by mouth every 6 (six) hours as needed (for  pain; may cause constipation). 02/25/15   Molpus, John, MD  ibuprofen (ADVIL,MOTRIN) 600 MG tablet Take 1 tablet (600 mg total) by mouth every 6 (six) hours as needed. 05/28/16   Nona Dell, PA-C  lisinopril-hydrochlorothiazide (PRINZIDE,ZESTORETIC) 10-12.5 MG per tablet Take 1 tablet by mouth 2 (two) times daily.      [provider]  omeprazole (PRILOSEC) 20 MG capsule Take 20 mg by mouth 2 (two) times a week.    [provider]  sitaGLIPtan-metformin (JANUMET) 50-1000 MG per tablet Take 1 tablet by mouth 2 (two) times daily with a meal.      [provider]    Physical Exam: Vitals:   01/21/18 0502 01/21/18 0600 01/21/18 0603 01/21/18 0731  BP:  138/71 105/80 (!) 80/55  Pulse:  (!) 123  (!) 109  Resp:  (!) 26  18    Temp: (!) 102.4 F (39.1 C)   99 F (37.2 C)  TempSrc: Rectal   Oral  SpO2:  95%  95%  Weight:      Height:          Constitutional: NAD, calm, comfortable Eyes: PERRL, lids and conjunctivae normal ENMT: Mucous membranes are moist. Neck: normal, supple Respiratory: clear to auscultation bilaterally, no wheezing, no crackles. Normal respiratory effort. No accessory muscle use.  Cardiovascular: Regular rate and rhythm, no murmurs / rubs / gallops. No extremity edema. 2+ pedal pulses.  Abdomen: no tenderness, no masses palpated. Bowel sounds positive.  Musculoskeletal: no clubbing / cyanosis. Normal muscle tone.  Skin: Slight erythematous, 2 cm round boil on her back with induration felt subcutaneous, tender to palpation Neurologic: CN 2-12 grossly intact. Strength 5/5 in all 4.  Psychiatric: Normal judgment and insight. Alert and oriented x 3. Normal mood.   Labs on Admission: I have personally reviewed following labs and imaging studies  CBC: Recent Labs  Lab 01/21/18 0521  WBC 13.4*  NEUTROABS 11.1*  HGB 13.0  HCT 37.5  MCV 92.6  PLT 202   Basic Metabolic Panel: Recent Labs  Lab 01/21/18 0521  NA 127*  K 3.7  CL 93*  CO2 20*  GLUCOSE 348*  BUN 40*  CREATININE 2.14*  CALCIUM 9.0   GFR: Estimated Creatinine Clearance: 35 mL/min (A) (by C-G formula based on SCr of 2.14 mg/dL (H)). Liver Function Tests: Recent Labs  Lab 01/21/18 0521  AST 28  ALT 31  ALKPHOS 75  BILITOT 1.5*  PROT 7.4  ALBUMIN 3.1*   No results for input(s): LIPASE, AMYLASE in the last 168 hours. No results for input(s): AMMONIA in the last 168 hours. Coagulation Profile: No results for input(s): INR, PROTIME in the last 168 hours. Cardiac Enzymes: No results for input(s): CKTOTAL, CKMB, CKMBINDEX, TROPONINI in the last 168 hours. BNP (last 3 results) No results for input(s): PROBNP in the last 8760 hours. HbA1C: No results for input(s): HGBA1C in the last 72 hours. CBG: No  results for input(s): GLUCAP in the last 168 hours. Lipid Profile: No results for input(s): CHOL, HDL, LDLCALC, TRIG, CHOLHDL, LDLDIRECT in the last 72 hours. Thyroid Function Tests: No results for input(s): TSH, T4TOTAL, FREET4, T3FREE, THYROIDAB in the last 72 hours. Anemia Panel: No results for input(s): VITAMINB12, FOLATE, FERRITIN, TIBC, IRON, RETICCTPCT in the last 72 hours. Urine analysis:    Component Value Date/Time   COLORURINE ORANGE (A) 01/21/2018 0627   APPEARANCEUR CLOUDY (A) 01/21/2018 0627   LABSPEC >1.030 (H) 01/21/2018 0627   PHURINE 5.0 01/21/2018 5427  GLUCOSEU 100 (A) 01/21/2018 0627   HGBUR MODERATE (A) 01/21/2018 0627   BILIRUBINUR MODERATE (A) 01/21/2018 0627   KETONESUR NEGATIVE 01/21/2018 0627   PROTEINUR 30 (A) 01/21/2018 0627   UROBILINOGEN 0.2 03/06/2011 1417   NITRITE NEGATIVE 01/21/2018 0627   LEUKOCYTESUR MODERATE (A) 01/21/2018 0627     Radiological Exams on Admission: Dg Chest 2 View  Result Date: 01/21/2018 CLINICAL DATA:  57 y/o  F; abscess on the back for 2 days. Fever. EXAM: CHEST - 2 VIEW COMPARISON:  12/10/2016 CT chest. FINDINGS: Normal cardiac silhouette. Aortic atherosclerosis with calcification. Clear lungs. No pleural effusion or pneumothorax. No acute osseous abnormality is evident. IMPRESSION: No acute pulmonary process identified. Electronically Signed   By: Kristine Garbe M.D.   On: 01/21/2018 05:46    EKG: Independently reviewed. Sinus tachycardia   Assessment/Plan Principal Problem:   Sepsis ([place]) Active Problems:   Thyroid disease   Hypertension   Acute lower UTI   AKI (acute kidney injury) ([place])   Hyponatremia   DM (diabetes mellitus) ([place])    Severe sepsis due to UTI /sub-cutaneous abscess on her back -cultures obtained, started on Vancomycin and Ceftriaxone, continue for now.  Could not fully exclude an abscess on her back, also obtain soft tissue ultrasound to evaluate with an I&D is needed.  Consult  surgery if ultrasound confirms fluid collection -She was hypotensive as low as 70s however patient is completely asymptomatic, continue fluids  Acute kidney injury -likely in the setting of sepsis, IVF, repeat renal function in am  -hold ACEI  HTN -hold antihypertensives in the setting of sepsis  Hyponatremia -due to dehydration, repeat BMP after fluids  ?Hypothyroidism -hold Armor, check TSH  DM -place on SSI, hold home meds   DVT prophylaxis: heparin Code Status: Full code  Family Communication: no family at bedside Disposition Plan: home when ready, for now admit to SDU Consults called: none      Admission status: inpatient   At the time of admission, it appears that the appropriate admission status for this patient is INPATIENT. This is judged to be reasonable and necessary in order to provide the required high service intensity to ensure the patient's safety given the presenting symptoms, physical exam findings, and initial radiographic and laboratory data in the context of their chronic comorbidities. Current circumstances are severe sepsis, and it is felt to place patient at high risk for further clinical deterioration threatening life, limb, or organ. Moreover, it is my clinical judgment that the patient will require inpatient hospital care spanning beyond 2 midnights from the point of admission and that early discharge would result in unnecessary risk of decompensation and readmission or threat to life, limb or bodily function.   Marzetta Board, MD [facility] Pager [phone]  If 7PM-7AM, please contact night-coverage [email or web address] Password [email or web address]  01/21/2018, 7:40 AM

## 2018-01-21 NOTE — ED Provider Notes (Signed)
[facility] EMERGENCY DEPARTMENT Provider Note  CSN: [id number] Arrival date & time: 01/21/18 4259  Chief Complaint(s) Abscess  HPI Margaret Carey is a 57 y.o. female with a history of hypertension, diabetes who presents to the emergency department for 2 days of subjective fevers.  She believes this is related to a lump on her back which she believes is an abscess.  She denies respiratory symptoms but is coughing in the room.  She denies any associated nausea or vomiting.  No abdominal pain.  Denies any dysuria, frequency or urgency but does state that her urine has been more cloudy over the past 2 days.  She denies any known sick contacts.  Denies any headache.  Of note she did report having 1 day of right lower extremity numbness that has been persistent since this morning.  She denies any associated weakness.  HPI  Past Medical History Past Medical History:  Diagnosis Date  . Diabetes mellitus   . Hypertension   . Obese   . Thyroid disease    Hypothyroid   Patient Active Problem List   Diagnosis Date Noted  . Sepsis ([place]) 01/21/2018  . Thyroid disease   . Hypertension    Home Medication(s) Prior to Admission medications   Medication Sig Start Date End Date Taking? Authorizing Provider  ARMOUR THYROID PO Take by mouth. 180mg  po once daily    [provider]  aspirin 81 MG tablet Take 81 mg by mouth daily.    [provider]  canagliflozin (INVOKANA) 100 MG TABS tablet Take 100 mg by mouth.    [provider]  cephALEXin (KEFLEX) 500 MG capsule Take 1 capsule (500 mg total) by mouth 4 (four) times daily. 05/28/16   Nona Dell, PA-C  glimepiride (AMARYL) 4 MG tablet Take 4 mg by mouth 2 (two) times daily.      [provider]  HYDROcodone-acetaminophen (NORCO) 5-325 MG tablet Take 1-2 tablets by mouth every 6 (six) hours as needed (for pain; may cause constipation). 02/25/15   Molpus, John, MD  ibuprofen (ADVIL,MOTRIN)  600 MG tablet Take 1 tablet (600 mg total) by mouth every 6 (six) hours as needed. 05/28/16   Nona Dell, PA-C  lisinopril-hydrochlorothiazide (PRINZIDE,ZESTORETIC) 10-12.5 MG per tablet Take 1 tablet by mouth 2 (two) times daily.      [provider]  omeprazole (PRILOSEC) 20 MG capsule Take 20 mg by mouth 2 (two) times a week.    [provider]  sitaGLIPtan-metformin (JANUMET) 50-1000 MG per tablet Take 1 tablet by mouth 2 (two) times daily with a meal.      [provider]                                                                                                                                    Past Surgical History Past Surgical History:  Procedure Laterality Date  . 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 CERVICAL CONE BIOPSY  1990  . HERNIA REPAIR    . HIP SURGERY     Bilateral replacement  . OOPHORECTOMY     LSO and RSO   Family History Family History  Problem Relation Age of Onset  . Breast cancer Mother        Age 71  . Diabetes Mother   . Liver cancer Mother   . Lung cancer Father   . Colon cancer Neg Hx     Social History Social History   Tobacco Use  . Smoking status: Current Every Day Smoker    Packs/day: 0.50    Types: Cigarettes  . Smokeless tobacco: Never Used  Substance Use Topics  . Alcohol use: Yes    Alcohol/week: 0.0 standard drinks    Comment: Rare  . Drug use: No   Allergies Patient has no known allergies.  Review of Systems Review of Systems All other systems are reviewed and are negative for acute change except as noted in the HPI  Physical Exam Vital Signs  I have reviewed the triage vital signs BP 105/80   Pulse (!) 123   Temp (!) 102.4 F (39.1 C) (Rectal)   Resp (!) 26   Ht 5' 3.5" (1.613 m)   Wt 115.7 kg   SpO2 95%   BMI 44.46 kg/m   Physical Exam  Constitutional: She is oriented to person, place, and time. She appears well-developed and well-nourished. No  distress.  HENT:  Head: Normocephalic and atraumatic.  Nose: Nose normal.  Eyes: Pupils are equal, round, and reactive to light. Conjunctivae and EOM are normal. Right eye exhibits no discharge. Left eye exhibits no discharge. No scleral icterus.  Neck: Normal range of motion. Neck supple.  Cardiovascular: Regular rhythm. Tachycardia present. Exam reveals no gallop and no friction rub.  No murmur heard. Pulmonary/Chest: Effort normal and breath sounds normal. No stridor. No respiratory distress. She has no wheezes. She has no rhonchi. She has no rales.  Abdominal: Soft. She exhibits no distension. There is no tenderness.  Musculoskeletal: She exhibits no edema or tenderness.  Neurological: She is alert and oriented to person, place, and time.  Skin: Skin is warm and dry. Lesion noted. No rash noted. She is not diaphoretic. No erythema.     Psychiatric: She has a normal mood and affect.  Vitals reviewed.   ED Results and Treatments Labs (all labs ordered are listed, but only abnormal results are displayed) Labs Reviewed  COMPREHENSIVE METABOLIC PANEL - Abnormal; Notable for the following components:      Result Value   Sodium 127 (*)    Chloride 93 (*)    CO2 20 (*)    Glucose, Bld 348 (*)    BUN 40 (*)    Creatinine, Ser 2.14 (*)    Albumin 3.1 (*)    Total Bilirubin 1.5 (*)    GFR calc non Af Amer 24 (*)    GFR calc Af Amer 28 (*)    All other components within normal limits  CBC WITH DIFFERENTIAL/PLATELET - Abnormal; Notable for the following components:   WBC 13.4 (*)    Neutro Abs 11.1 (*)    Lymphs Abs 0.5 (*)    Monocytes Absolute 1.2 (*)    Abs Immature Granulocytes 0.15 (*)    All other components within normal limits  URINALYSIS, ROUTINE W REFLEX MICROSCOPIC - Abnormal; Notable for the following components:   Color, Urine ORANGE (*)  APPearance CLOUDY (*)    Specific Gravity, Urine >1.030 (*)    Glucose, UA 100 (*)    Hgb urine dipstick MODERATE (*)     Bilirubin Urine MODERATE (*)    Protein, ur 30 (*)    Leukocytes, UA MODERATE (*)    All other components within normal limits  URINALYSIS, MICROSCOPIC (REFLEX) - Abnormal; Notable for the following components:   Bacteria, UA MANY (*)    All other components within normal limits  I-STAT CG4 LACTIC ACID, ED - Abnormal; Notable for the following components:   Lactic Acid, Venous 2.40 (*)    All other components within normal limits  CULTURE, BLOOD (ROUTINE X 2)  CULTURE, BLOOD (ROUTINE X 2)  URINE CULTURE  I-STAT CG4 LACTIC ACID, ED                                                                                                                         EKG  EKG Interpretation  Date/Time:  Saturday January 21 2018 05:00:17 EDT Ventricular Rate:  123 PR Interval:    QRS Duration: 90 QT Interval:  320 QTC Calculation: 458 R Axis:   82 Text Interpretation:  Sinus tachycardia No significant change since last tracing Confirmed by Addison Lank [phone]) on 01/21/2018 5:40:01 AM      Radiology Dg Chest 2 View  Result Date: 01/21/2018 CLINICAL DATA:  57 y/o  F; abscess on the back for 2 days. Fever. EXAM: CHEST - 2 VIEW COMPARISON:  12/10/2016 CT chest. FINDINGS: Normal cardiac silhouette. Aortic atherosclerosis with calcification. Clear lungs. No pleural effusion or pneumothorax. No acute osseous abnormality is evident. IMPRESSION: No acute pulmonary process identified. Electronically Signed   By: Kristine Garbe M.D.   On: 01/21/2018 05:46   Pertinent labs & imaging results that were available during my care of the patient were reviewed by me and considered in my medical decision making (see chart for details).  Medications Ordered in ED Medications  cefTRIAXone (ROCEPHIN) 2 g in sodium chloride 0.9 % 100 mL IVPB (0 g Intravenous Stopped 01/21/18 0624)  vancomycin (VANCOCIN) IVPB 1000 mg/200 mL premix (1,000 mg Intravenous New Bag/Given 01/21/18 0605)    Followed by    vancomycin (VANCOCIN) IVPB 1000 mg/200 mL premix (has no administration in time range)  sodium chloride 0.9 % bolus 1,000 mL ( Intravenous Rate/Dose Verify 01/21/18 0606)  acetaminophen (TYLENOL) tablet 1,000 mg (1,000 mg Oral Given 01/21/18 3825)  Procedures Procedures EMERGENCY DEPARTMENT US SOFT TISSUE INTERPRETATION "Study: Limited Soft Tissue Ultrasound"  INDICATIONS: Pain Multiple views of the body part were obtained in real-time with a multi-frequency linear probe  PERFORMED BY: Myself IMAGES ARCHIVED?: Yes SIDE:Right  BODY PART:mid back INTERPRETATION:  area of hyperechoic matter w/o evidence of cellulitis   (including critical care time)  CRITICAL CARE Performed by: Grayce Sessions Basim Bartnik Total critical care time: 35 minutes Critical care time was exclusive of separately billable procedures and treating other patients. Critical care was necessary to treat or prevent imminent or life-threatening deterioration. Critical care was time spent personally by me on the following activities: development of treatment plan with patient and/or surrogate as well as nursing, discussions with consultants, evaluation of patient's response to treatment, examination of patient, obtaining history from patient or surrogate, ordering and performing treatments and interventions, ordering and review of laboratory studies, ordering and review of radiographic studies, pulse oximetry and re-evaluation of patient's condition.   Medical Decision Making / ED Course I have reviewed the nursing notes for this encounter and the patient's prior records (if available in EHR or on provided paperwork).    Patient noted to be febrile, tachycardic but with stable blood pressures.  Lump on her back was evaluated by bedside ultrasound and not convincing for abscess.  Believe that this  is likely a sebaceous cyst.   Sepsis work-up was initiated.  Labs notable for leukocytosis and elevated lactic acid.  Code sepsis was initiated.  Patient was started on empiric antibiotics and IV fluid.  Blood pressures were above 90 and lactic acid is less than 4, so she did not require 30 cc/kg of IV fluids.  Rest of the work-up revealed hyperglycemia without evidence of DKA.  Also notable for AKI.  Source appears to be urinary.  Chest x-ray was negative.  Case was discussed with hospitalist who will admit the patient for continued management.  Final Clinical Impression(s) / ED Diagnoses Final diagnoses:  Sepsis due to urinary tract infection ([place])      This chart was dictated using voice recognition software.  Despite best efforts to proofread,  errors can occur which can change the documentation meaning.   Fatima Blank, MD 01/21/18 [phone]

## 2018-01-21 NOTE — ED Notes (Signed)
[place], MD made aware pts manual BP 80/55.

## 2018-01-21 NOTE — Progress Notes (Signed)
Pharmacy Antibiotic Note  Margaret Carey is a 57 y.o. female admitted on 01/21/2018 with sepsis. Patient has abscess on back and possible UTI per MD although asymptomatic except for "dark, foul smelling" urine per MD note.  Pharmacy has been consulted for vancomycin dosing. Patient is in ARF with SCr 2.14  Plan: Vancomycin 1000 mg iv x 2 loading dose given. Will f/u renal function and plans for antibiotics 10/20. Vancomycin placeholder ordered.   Height: 5' 3.5" (161.3 cm) Weight: 255 lb (115.7 kg) IBW/kg (Calculated) : 53.55  Temp (24hrs), Avg:100.1 F (37.8 C), Min:98.9 F (37.2 C), Max:102.4 F (39.1 C)  Recent Labs  Lab 01/21/18 0517 01/21/18 0521 01/21/18 0751  WBC  --  13.4*  --   CREATININE  --  2.14*  --   LATICACIDVEN 2.40*  --  1.62    Estimated Creatinine Clearance: 35 mL/min (A) (by C-G formula based on SCr of 2.14 mg/dL (H)).    No Known Allergies  Antimicrobials this admission: CTX 10/19 >>  vancomycin 10/19 >>   Dose adjustments this admission:   Microbiology results: 10/19 BCx:  10/19 UCx:   10/19 MRSA PCR:   Thank you for allowing pharmacy to be a part of this patient's care.  [name] 01/21/2018 1:38 PM

## 2018-01-21 NOTE — ED Notes (Signed)
Pt aware of need for UA sample will notify staff when able to void.

## 2018-01-22 ENCOUNTER — Inpatient Hospital Stay (HOSPITAL_COMMUNITY): Payer: 59 | Admitting: Certified Registered Nurse Anesthetist

## 2018-01-22 ENCOUNTER — Encounter (HOSPITAL_COMMUNITY)
Admission: EM | Disposition: A | Discharge status: Home / Self Care | Payer: Self-pay | Source: Home / Self Care | Attending: Internal Medicine

## 2018-01-22 ENCOUNTER — Encounter (HOSPITAL_COMMUNITY): Payer: Self-pay | Admitting: *Deleted

## 2018-01-22 DIAGNOSIS — L02212 Cutaneous abscess of back [any part, except buttock]: Secondary | ICD-10-CM | POA: Diagnosis present

## 2018-01-22 DIAGNOSIS — R6521 Severe sepsis with septic shock: Secondary | ICD-10-CM

## 2018-01-22 DIAGNOSIS — A419 Sepsis, unspecified organism: Secondary | ICD-10-CM | POA: Diagnosis present

## 2018-01-22 HISTORY — PX: IRRIGATION AND DEBRIDEMENT ABSCESS: SHX5252

## 2018-01-22 LAB — COMPREHENSIVE METABOLIC PANEL
ALT: 31 U/L (ref 0–44)
ANION GAP: 9 (ref 5–15)
AST: 21 U/L (ref 15–41)
Albumin: 2.8 g/dL — ABNORMAL LOW (ref 3.5–5.0)
Alkaline Phosphatase: 80 U/L (ref 38–126)
BUN: 21 mg/dL — ABNORMAL HIGH (ref 6–20)
CO2: 22 mmol/L (ref 22–32)
Calcium: 8.6 mg/dL — ABNORMAL LOW (ref 8.9–10.3)
Chloride: 105 mmol/L (ref 98–111)
Creatinine, Ser: 0.87 mg/dL (ref 0.44–1.00)
GFR calc non Af Amer: >60 mL/min (ref >60)
Glucose, Bld: 249 mg/dL — ABNORMAL HIGH (ref 70–99)
Potassium: 3.9 mmol/L (ref 3.5–5.1)
SODIUM: 136 mmol/L (ref 135–145)
Total Bilirubin: 0.9 mg/dL (ref 0.3–1.2)
Total Protein: 6.8 g/dL (ref 6.5–8.1)

## 2018-01-22 LAB — CREATININE, URINE, RANDOM: CREATININE, URINE: 66.58 mg/dL

## 2018-01-22 LAB — CBC
HEMATOCRIT: 32.6 % — AB (ref 36.0–46.0)
Hemoglobin: 11 g/dL — ABNORMAL LOW (ref 12.0–15.0)
MCH: 31.8 pg (ref 26.0–34.0)
MCHC: 33.7 g/dL (ref 30.0–36.0)
MCV: 94.2 fL (ref 80.0–100.0)
NRBC: 0 % (ref 0.0–0.2)
PLATELETS: 127 10*3/uL — AB (ref 150–400)
RBC: 3.46 MIL/uL — AB (ref 3.87–5.11)
RDW: 12.9 % (ref 11.5–15.5)
WBC: 9.1 10*3/uL (ref 4.0–10.5)

## 2018-01-22 LAB — GLUCOSE, CAPILLARY
GLUCOSE-CAPILLARY: 258 mg/dL — AB (ref 70–99)
GLUCOSE-CAPILLARY: 211 mg/dL — AB (ref 70–99)
GLUCOSE-CAPILLARY: 304 mg/dL — AB (ref 70–99)
Glucose-Capillary: 248 mg/dL — ABNORMAL HIGH (ref 70–99)

## 2018-01-22 LAB — URINE CULTURE

## 2018-01-22 LAB — SODIUM, URINE, RANDOM: SODIUM UR: 51 mmol/L

## 2018-01-22 LAB — HIV ANTIBODY (ROUTINE TESTING W REFLEX): HIV SCREEN 4TH GENERATION: NONREACTIVE

## 2018-01-22 LAB — MAGNESIUM: Magnesium: 2 mg/dL (ref 1.7–2.4)

## 2018-01-22 SURGERY — IRRIGATION AND DEBRIDEMENT ABSCESS
Anesthesia: General | Site: Back

## 2018-01-22 MED ORDER — MIDAZOLAM HCL 2 MG/2ML IJ SOLN
INTRAMUSCULAR | Status: AC
  Filled 2018-01-22: qty 2

## 2018-01-22 MED ORDER — INSULIN GLARGINE 100 UNIT/ML ~~LOC~~ SOLN
15.0000 [IU] | SUBCUTANEOUS | Status: DC
  Administered 2018-01-22 – 2018-01-23 (×2): 15 [IU] via SUBCUTANEOUS
  Filled 2018-01-22 (×2): qty 0.15

## 2018-01-22 MED ORDER — FENTANYL CITRATE (PF) 100 MCG/2ML IJ SOLN
INTRAMUSCULAR | Status: AC
  Filled 2018-01-22: qty 2

## 2018-01-22 MED ORDER — DEXAMETHASONE SODIUM PHOSPHATE 10 MG/ML IJ SOLN
INTRAMUSCULAR | Status: DC | PRN
  Administered 2018-01-22: 5 mg via INTRAVENOUS

## 2018-01-22 MED ORDER — MIDAZOLAM HCL 5 MG/5ML IJ SOLN
INTRAMUSCULAR | Status: DC | PRN
  Administered 2018-01-22: 2 mg via INTRAVENOUS

## 2018-01-22 MED ORDER — 0.9 % SODIUM CHLORIDE (POUR BTL) OPTIME
TOPICAL | Status: DC | PRN
  Administered 2018-01-22: 1000 mL

## 2018-01-22 MED ORDER — BUPIVACAINE HCL (PF) 0.25 % IJ SOLN
INTRAMUSCULAR | Status: AC
  Filled 2018-01-22: qty 30

## 2018-01-22 MED ORDER — LIDOCAINE 2% (20 MG/ML) 5 ML SYRINGE
INTRAMUSCULAR | Status: AC
  Filled 2018-01-22: qty 5

## 2018-01-22 MED ORDER — PHENYLEPHRINE 40 MCG/ML (10ML) SYRINGE FOR IV PUSH (FOR BLOOD PRESSURE SUPPORT)
PREFILLED_SYRINGE | INTRAVENOUS | Status: DC | PRN
  Administered 2018-01-22 (×3): 80 ug via INTRAVENOUS

## 2018-01-22 MED ORDER — SUCCINYLCHOLINE CHLORIDE 200 MG/10ML IV SOSY
PREFILLED_SYRINGE | INTRAVENOUS | Status: AC
  Filled 2018-01-22: qty 10

## 2018-01-22 MED ORDER — FENTANYL CITRATE (PF) 100 MCG/2ML IJ SOLN: 25.0000 ug | INTRAMUSCULAR | Status: DC | PRN

## 2018-01-22 MED ORDER — FENTANYL CITRATE (PF) 100 MCG/2ML IJ SOLN
INTRAMUSCULAR | Status: DC | PRN
  Administered 2018-01-22 (×2): 50 ug via INTRAVENOUS

## 2018-01-22 MED ORDER — BUPIVACAINE-EPINEPHRINE 0.5% -1:200000 IJ SOLN
INTRAMUSCULAR | Status: DC | PRN
  Administered 2018-01-22: 45 mL

## 2018-01-22 MED ORDER — SUCCINYLCHOLINE CHLORIDE 200 MG/10ML IV SOSY
PREFILLED_SYRINGE | INTRAVENOUS | Status: DC | PRN
  Administered 2018-01-22: 100 mg via INTRAVENOUS

## 2018-01-22 MED ORDER — LIDOCAINE 2% (20 MG/ML) 5 ML SYRINGE
INTRAMUSCULAR | Status: DC | PRN
  Administered 2018-01-22: 100 mg via INTRAVENOUS

## 2018-01-22 MED ORDER — THYROID 60 MG PO TABS
180.0000 mg | ORAL_TABLET | Freq: Every day | ORAL | Status: DC
  Administered 2018-01-23 – 2018-01-25 (×3): 180 mg via ORAL
  Filled 2018-01-22 (×4): qty 3

## 2018-01-22 MED ORDER — PROPOFOL 10 MG/ML IV BOLUS
INTRAVENOUS | Status: AC
  Filled 2018-01-22: qty 20

## 2018-01-22 MED ORDER — ONDANSETRON HCL 4 MG/2ML IJ SOLN: 4.0000 mg | Freq: Once | INTRAMUSCULAR | Status: DC | PRN

## 2018-01-22 MED ORDER — LACTATED RINGERS IV SOLN
INTRAVENOUS | Status: DC | PRN
  Administered 2018-01-22: 12:00:00 via INTRAVENOUS

## 2018-01-22 MED ORDER — VANCOMYCIN HCL 10 G IV SOLR
1250.0000 mg | INTRAVENOUS | Status: DC
  Administered 2018-01-22 – 2018-01-24 (×3): 1250 mg via INTRAVENOUS
  Filled 2018-01-22 (×5): qty 1250

## 2018-01-22 MED ORDER — LACTATED RINGERS IV SOLN: INTRAVENOUS | Status: DC

## 2018-01-22 MED ORDER — SODIUM CHLORIDE 0.9 % IV BOLUS: 1000.0000 mL | Freq: Once | INTRAVENOUS | Status: DC

## 2018-01-22 MED ORDER — SODIUM CHLORIDE 0.9 % IV BOLUS
1000.0000 mL | Freq: Once | INTRAVENOUS | Status: AC
  Administered 2018-01-22: 1000 mL via INTRAVENOUS

## 2018-01-22 MED ORDER — ONDANSETRON HCL 4 MG/2ML IJ SOLN
INTRAMUSCULAR | Status: AC
  Filled 2018-01-22: qty 2

## 2018-01-22 MED ORDER — THYROID 60 MG PO TABS
180.0000 mg | ORAL_TABLET | Freq: Every day | ORAL | Status: DC
  Filled 2018-01-22: qty 1

## 2018-01-22 MED ORDER — PHENYLEPHRINE 40 MCG/ML (10ML) SYRINGE FOR IV PUSH (FOR BLOOD PRESSURE SUPPORT)
PREFILLED_SYRINGE | INTRAVENOUS | Status: AC
  Filled 2018-01-22: qty 10

## 2018-01-22 MED ORDER — PROPOFOL 10 MG/ML IV BOLUS
INTRAVENOUS | Status: DC | PRN
  Administered 2018-01-22: 50 mg via INTRAVENOUS
  Administered 2018-01-22: 200 mg via INTRAVENOUS

## 2018-01-22 MED ORDER — BUPIVACAINE-EPINEPHRINE 0.5% -1:200000 IJ SOLN
INTRAMUSCULAR | Status: AC
  Filled 2018-01-22: qty 1

## 2018-01-22 MED ORDER — ONDANSETRON HCL 4 MG/2ML IJ SOLN
INTRAMUSCULAR | Status: DC | PRN
  Administered 2018-01-22: 4 mg via INTRAVENOUS

## 2018-01-22 MED ORDER — ROCURONIUM BROMIDE 100 MG/10ML IV SOLN
INTRAVENOUS | Status: AC
  Filled 2018-01-22: qty 1

## 2018-01-22 SURGICAL SUPPLY — 28 items
BLADE CLIPPER SURG (BLADE) IMPLANT
BLADE SURG 15 STRL LF DISP TIS (BLADE) ×1 IMPLANT
BLADE SURG 15 STRL SS (BLADE) ×2
CHLORAPREP W/TINT 26ML (MISCELLANEOUS) IMPLANT
COVER SURGICAL LIGHT HANDLE (MISCELLANEOUS) ×2 IMPLANT
COVER WAND RF STERILE (DRAPES) IMPLANT
DRAPE LAPAROSCOPIC ABDOMINAL (DRAPES) ×2 IMPLANT
ELECT PENCIL ROCKER SW 15FT (MISCELLANEOUS) ×2 IMPLANT
ELECT REM PT RETURN 15FT ADLT (MISCELLANEOUS) ×2 IMPLANT
GAUZE SPONGE 4X4 12PLY STRL (GAUZE/BANDAGES/DRESSINGS) ×2 IMPLANT
GLOVE BIOGEL PI IND STRL 8 (GLOVE) ×1 IMPLANT
GLOVE BIOGEL PI INDICATOR 8 (GLOVE) ×1
GLOVE SURG SS PI 7.5 STRL IVOR (GLOVE) ×2 IMPLANT
GOWN STRL REUS W/TWL LRG LVL3 (GOWN DISPOSABLE) ×2 IMPLANT
GOWN STRL REUS W/TWL XL LVL3 (GOWN DISPOSABLE) ×2 IMPLANT
KIT BASIN OR (CUSTOM PROCEDURE TRAY) ×2 IMPLANT
NS IRRIG 1000ML POUR BTL (IV SOLUTION) ×2 IMPLANT
PACK GENERAL/GYN (CUSTOM PROCEDURE TRAY) ×2 IMPLANT
PAD ABD 8X10 STRL (GAUZE/BANDAGES/DRESSINGS) ×1 IMPLANT
SOL PREP PROV IODINE SCRUB 4OZ (MISCELLANEOUS) ×1 IMPLANT
SPONGE LAP 18X18 RF (DISPOSABLE) ×2 IMPLANT
SWAB COLLECTION DEVICE MRSA (MISCELLANEOUS) ×1 IMPLANT
SWAB CULTURE ESWAB REG 1ML (MISCELLANEOUS) ×1 IMPLANT
SYR BULB IRRIGATION 50ML (SYRINGE) ×2 IMPLANT
TAPE CLOTH SURG 4X10 WHT LF (GAUZE/BANDAGES/DRESSINGS) ×1 IMPLANT
TAPE CLOTH SURG 6X10 WHT LF (GAUZE/BANDAGES/DRESSINGS) ×1 IMPLANT
TOWEL OR 17X26 10 PK STRL BLUE (TOWEL DISPOSABLE) ×2 IMPLANT
YANKAUER SUCT BULB TIP NO VENT (SUCTIONS) ×2 IMPLANT

## 2018-01-22 NOTE — Progress Notes (Signed)
PROGRESS NOTE    Margaret ORDAZ  SMO:[id number] DOB: January 17, 1961 DOA: 01/21/2018 PCP: Shon Baton, MD    Brief Narrative:   Margaret Carey is a 57 y.o. female with medical history significant of hypothyroidism, diabetes mellitus, hypertension, obesity, who presented to the hospital with chief complaint of fever and chills and general weakness for the last 2 to 3 days.  She was also been complaining for the past week of a boil on her back which has been increasing in size and has becoming more tender.  She also had noticed that over the last 24 hours her urine has become a little bit darker and the smell has changed.  She denied any chest pain, denies any shortness of breath.  She denied any abdominal pain, denied any nausea or vomiting.  She denied any lightheadedness or dizziness.  No numbness or tingling.  ED Course: In the emergency room she was febrile up to 102.4, heart rate is in the 1 teens, and she did have few episodes of hypotension with blood pressure as low as 70s which responded to fluids.  She was asymptomatic during that time.  Blood work reveals sodium 127, creatinine of 2.1, lactic acid was initially 2.4 but normalized with fluids, white count 13.4.  Bedside ultrasound per EDP did not show any abscess in her boil on her back.  Urinalysis was evidence of a UTI.  She was given vancomycin and ceftriaxone and hospitalist asked to admit patient.   Assessment & Plan:   Principal Problem:   Sepsis ([place]) Active Problems:   Acute lower UTI   Abscess of back   Thyroid disease   Hypertension   AKI (acute kidney injury) ([place])   Hyponatremia   DM (diabetes mellitus) ([place])  1 severe sepsis secondary to subcutaneous abscess on her back and UTI Patient presented with severe sepsis noted to have a fever of 102.4, noted to be hypotensive with systolic blood pressures in the 70s, creatinine of 2.1, lactic acid initially elevated at 2.4, sodium of 127, urinalysis consistent with UTI.  Patient  still with complaints of back pain.  Ultrasound of soft tissue of the back was done which was negative for abscess.  However on examination patient has significant induration of her mid to lower back with erythema, tenderness to palpation and pus been able to be expressed from the site.  Patient with borderline blood pressure.  Consult with general surgery for I and D and wound cultures.  Blood cultures pending.  Urine cultures pending.  Continue empiric IV vancomycin and IV Rocephin.  IV fluids.  Supportive care.  2.  Abscess of back On examination patient noted to have significant induration, erythema, tenderness to palpation with pus oozing from the site.  Blood cultures pending.  Ultrasound of soft tissues of the back done 01/21/2018 with no significant abnormality.  On clinical exam patient definitely has an abscess with pus oozing from site with significant induration.  Consult with general surgery for I and D and cultures.  Continue IV vancomycin.  Follow.  3.  UTI Patient with dysuria with some clinical improvement.  Urine cultures pending.  CBC pending for this morning.  Continue empiric IV Rocephin.  4.  Hypothyroidism Resume home dose are normal.  5.  Acute renal failure Patient noted on admission to have a creatinine of 2.14.  Likely secondary to a prerenal azotemia as patient noted on admission to be hypotensive with systolic blood pressures in the 70s in the setting of ACE  inhibitor and diuretics.  Urine output of 500 cc over the past 24 hours.  Urinalysis done with protein of 30 specific gravity greater than 1.030.  Check a urine sodium.  Check a urine creatinine.  Labs pending.  Continue IV fluids.  Strict I's and O's.  6.  Hyponatremia Likely secondary to hypovolemic hyponatremia.  Continue IV fluids.  Labs pending for this morning.  7.  Hypertension Antihypertensive medications on hold.  Continue to hold due to borderline blood pressure sepsis.  8.  Diabetes mellitus type  2 Hemoglobin A1c 8.9.  CBG of 255 last night.  Place on Lantus 15 units daily.  Continue sliding scale insulin.    DVT prophylaxis: Heparin Code Status: Full Family Communication: Updated patient.  No family at bedside. Disposition Plan: Remain in stepdown unit for now.   Consultants:   General surgery: Dr. Excell Seltzer 01/22/2018  Procedures:   Chest x-ray 01/21/2018  Ultrasound of soft tissue of the back 01/21/2018  Antimicrobials:   IV Rocephin 01/21/2018  IV vancomycin 01/21/2018   Subjective: Patient complaining of significant back pain feels back abscess needs to be addressed.  Patient denies any chest pain.  No shortness of breath.  No abdominal pain.  Does endorse dysuria which is slowly improving since admission.  Denies any flank pain.  Objective: Vitals:   01/22/18 0500 01/22/18 0600 01/22/18 0700 01/22/18 0800  BP: (!) 96/38 (!) 102/58 113/72 (!) 119/44  Pulse: (!) 101 99 (!) 105 93  Resp: (!) 21 (!) 22 13 (!) 23  Temp:      TempSrc:      SpO2: 95% 96% (!) 89% 95%  Weight:      Height:        Intake/Output Summary (Last 24 hours) at 01/22/2018 0936 Last data filed at 01/22/2018 0800 Gross per 24 hour  Intake 1862.46 ml  Output 500 ml  Net 1362.46 ml   Filed Weights   01/21/18 0459  Weight: 115.7 kg    Examination:  General exam: Appears calm and comfortable  Respiratory system: Clear to auscultation. Respiratory effort normal. Cardiovascular system: Tachycardic.  No JVD, no murmurs, no rubs.  No lower extremity edema. Gastrointestinal system: Abdomen is nondistended, soft and nontender. No organomegaly or masses felt. Normal bowel sounds heard. Back: Erythematous, indurated, tender to palpation area on mid to lower back oozing some pus. Central nervous system: Alert and oriented. No focal neurological deficits. Extremities: Symmetric 5 x 5 power. Skin: No rashes, lesions or ulcers Psychiatry: Judgement and insight appear normal. Mood & affect  appropriate.     Data Reviewed: I have personally reviewed following labs and imaging studies  CBC: Recent Labs  Lab 01/21/18 0521  WBC 13.4*  NEUTROABS 11.1*  HGB 13.0  HCT 37.5  MCV 92.6  PLT 970   Basic Metabolic Panel: Recent Labs  Lab 01/21/18 0521  NA 127*  K 3.7  CL 93*  CO2 20*  GLUCOSE 348*  BUN 40*  CREATININE 2.14*  CALCIUM 9.0   GFR: Estimated Creatinine Clearance: 35 mL/min (A) (by C-G formula based on SCr of 2.14 mg/dL (H)). Liver Function Tests: Recent Labs  Lab 01/21/18 0521  AST 28  ALT 31  ALKPHOS 75  BILITOT 1.5*  PROT 7.4  ALBUMIN 3.1*   No results for input(s): LIPASE, AMYLASE in the last 168 hours. No results for input(s): AMMONIA in the last 168 hours. Coagulation Profile: No results for input(s): INR, PROTIME in the last 168 hours. Cardiac Enzymes: No results  for input(s): CKTOTAL, CKMB, CKMBINDEX, TROPONINI in the last 168 hours. BNP (last 3 results) No results for input(s): PROBNP in the last 8760 hours. HbA1C: Recent Labs    01/21/18 1427  HGBA1C 8.9*   CBG: Recent Labs  Lab 01/21/18 1625 01/21/18 1800 01/21/18 2127  GLUCAP 297* 300* 255*   Lipid Profile: No results for input(s): CHOL, HDL, LDLCALC, TRIG, CHOLHDL, LDLDIRECT in the last 72 hours. Thyroid Function Tests: Recent Labs    01/21/18 1427  TSH 1.650   Anemia Panel: No results for input(s): VITAMINB12, FOLATE, FERRITIN, TIBC, IRON, RETICCTPCT in the last 72 hours. Sepsis Labs: Recent Labs  Lab 01/21/18 0517 01/21/18 0751  LATICACIDVEN 2.40* 1.62    Recent Results (from the past 240 hour(s))  MRSA PCR Screening     Status: None   Collection Time: 01/21/18 12:51 PM  Result Value Ref Range Status   MRSA by PCR NEGATIVE NEGATIVE Final    Comment:        The GeneXpert MRSA Assay (FDA approved for NASAL specimens only), is one component of a comprehensive MRSA colonization surveillance program. It is not intended to diagnose MRSA infection nor  to guide or monitor treatment for MRSA infections. Performed at [facility], [place] [address], [place], [place] [address]          Radiology Studies: Dg Chest 2 View  Result Date: 01/21/2018 CLINICAL DATA:  57 y/o  F; abscess on the back for 2 days. Fever. EXAM: CHEST - 2 VIEW COMPARISON:  12/10/2016 CT chest. FINDINGS: Normal cardiac silhouette. Aortic atherosclerosis with calcification. Clear lungs. No pleural effusion or pneumothorax. No acute osseous abnormality is evident. IMPRESSION: No acute pulmonary process identified. Electronically Signed   By: Kristine Garbe M.D.   On: 01/21/2018 05:46   [place] Chest Soft Tissue  Result Date: 01/21/2018 CLINICAL DATA:  Concern for soft tissue abscess midline back region EXAM: ULTRASOUND OF SOFT TISSUES TECHNIQUE: Ultrasound examination of the soft tissues was performed in the area of clinical concern. COMPARISON:  None. FINDINGS: Limited superficial soft tissue ultrasound performed of the posterior thoracic region soft tissues in the area of concern. No significant superficial soft tissue fluid collection, cyst, mass, hematoma, or definite abscess. IMPRESSION: No significant abnormality by soft tissue ultrasound in the thoracic region area of concern. Electronically Signed   By: Jerilynn Mages.  Shick M.D.   On: 01/21/2018 14:10        Scheduled Meds: . aspirin EC  81 mg Oral Daily  . heparin  5,000 Units Subcutaneous Q8H  . insulin aspart  0-15 Units Subcutaneous TID WC  . insulin aspart  0-5 Units Subcutaneous QHS  . insulin glargine  15 Units Subcutaneous BH-[id number]  . nicotine  21 mg Transdermal Daily  . phenazopyridine  100 mg Oral BID  . vancomycin variable dose per unstable renal function (pharmacist dosing)   Does not apply See admin instructions   Continuous Infusions: . sodium chloride 100 mL/hr at 01/22/18 0800  . cefTRIAXone (ROCEPHIN)  IV Stopped (01/22/18 0617)     LOS: 1 day    Time spent: 40  minutes    Irine Seal, MD [facility] Pager [phone]  If 7PM-7AM, please contact night-coverage [email or web address] Password [facility] 01/22/2018, 9:36 AM

## 2018-01-22 NOTE — Progress Notes (Addendum)
Patient arrived from PACU with LR bolus running.  Pt. Continued to be hypotensive and asymptomatic.  RN updated Dr.Thompson and received an order for a 1L bolus of NS.  NS started upon completion of LR bolus from PACU.  RN will continue to monitor.

## 2018-01-22 NOTE — Op Note (Signed)
Preoperative Diagnosis: Deep subcutaneous abscess mid back  Postoprative Diagnosis: Same    Procedure: Procedure(s): Incision, drainage and debridement of skin and and subcutaneous tissue back   Surgeon: Excell Seltzer T   Assistants: None  Anesthesia:  General endotracheal anesthesia  Indications: Patient is a 57 year old diabetic female who presents with signs of early sepsis, UTI, and gradually enlarging painful draining mass on her upper back.  Exam revealed about a 8 to 10 cm area of induration with central fluctuance and purulent drainage and erythema.  I recommended incision drainage and debridement under general anesthesia in the operating room.  The procedure and indications and risks have been discussed with the patient and she is in agreement.    Procedure Detail: Patient was brought to the operating room, placed in the supine position on the operating table and general endotracheal anesthesia induced.  She was carefully positioned in the left lateral decubitus position padded and secured with a beanbag and tape.  The back was sterilely prepped with Betadine and draped.  She was already on broad-spectrum IV antibiotics.  I used a transverse elliptical excision sharply of skin and subcutaneous tissue which was carried down deeply into the abscess cavity which was unroofed and drained.  Purulent material was evacuated and cultured.  At this point there was purulence expressing from surrounding subcutaneous tissue with possible early necrosis without defined abscess.  I sharply debrided skin and deep subcutaneous tissue back to healthy appearing nonpurulent tissue.  This resulted in a debridement of 4 x 2 x 2 cm of skin and subcutaneous tissue done sharply and with cautery.  This was taken down to the level of the superficial fascia.  At this point the tissue appeared healthy and I could not express any more purulence.  I did incise a portion of the superficial fascia and the tissue  below this appeared healthy without purulence.  Hemostasis was obtained with cautery.  Soft tissue was infiltrated with Marcaine with epinephrine.  Wound was packed with moist saline gauze and dry sterile dressing applied.  Under needle and instrument counts were correct.    Findings: As above  Estimated Blood Loss:  Minimal         Drains: Packed open with moist saline gauze  Blood Given: none          Specimens: Culture and sensitivity        Complications:  * No complications entered in OR log *         Disposition: PACU - hemodynamically stable.         Condition: stable

## 2018-01-22 NOTE — Progress Notes (Signed)
Pharmacy Antibiotic Note  Margaret Carey is a 57 y.o. female admitted on 01/21/2018 with sepsis. Patient has abscess on back and possible UTI per MD although asymptomatic except for "dark, foul smelling" urine per MD note.  Pharmacy has been consulted for vancomycin dosing. Patient is in ARF with SCr 2.14  01/22/18 ARF resolved Patient is now s/p I&D  Plan: Vancomycin 1000 mg iv x 2 loading dose given. Will f/u renal function and plans for antibiotics 10/20. Vancomycin placeholder ordered.   01/22/18  Will initiate vancomycin 1250 mg iv q 24 h. AUC 463, IBW/ABW. - f/u renal fucntion, culture results - levels as indicated  Height: 5' 3.5" (161.3 cm) Weight: 255 lb (115.7 kg) IBW/kg (Calculated) : 53.55  Temp (24hrs), Avg:99.9 F (37.7 C), Min:98.2 F (36.8 C), Max:101.2 F (38.4 C)  Recent Labs  Lab 01/21/18 0517 01/21/18 0521 01/21/18 0751 01/22/18 0944  WBC  --  13.4*  --  9.1  CREATININE  --  2.14*  --  0.87  LATICACIDVEN 2.40*  --  1.62  --     Estimated Creatinine Clearance: 86.2 mL/min (by C-G formula based on SCr of 0.87 mg/dL).    Allergies  Allergen Reactions  . Sulfa Antibiotics Rash    Antimicrobials this admission: CTX 10/19 >>  vancomycin 10/19 >>   Dose adjustments this admission:   Microbiology results: 10/20 Abscess:  10/19 BCx:  10/19 UCx:   10/19 MRSA PCR: negative  Thank you for allowing pharmacy to be a part of this patient's care.  [name] 01/22/2018 12:42 PM

## 2018-01-22 NOTE — Transfer of Care (Signed)
Immediate Anesthesia Transfer of Care Note  Patient: Margaret Carey  Procedure(s) Performed: IRRIGATION AND DEBRIDEMENT BACK ABSCESS (N/A Back)  Patient Location: PACU  Anesthesia Type:General  Level of Consciousness: drowsy and patient cooperative  Airway & Oxygen Therapy: Patient Spontanous Breathing and Patient connected to face mask oxygen  Post-op Assessment: Report given to RN and Post -op Vital signs reviewed and stable  Post vital signs: Reviewed and stable  Last Vitals:  Vitals Value Taken Time  BP 105/41 01/22/2018 12:54 PM  Temp    Pulse 110 01/22/2018 12:55 PM  Resp 22 01/22/2018 12:55 PM  SpO2 94 % 01/22/2018 12:55 PM  Vitals shown include unvalidated device data.  Last Pain:  Vitals:   01/22/18 1045  TempSrc:   PainSc: 0-No pain      Patients Stated Pain Goal: 2 (82/95/62 1308)  Complications: No apparent anesthesia complications

## 2018-01-22 NOTE — Anesthesia Procedure Notes (Signed)
Procedure Name: Intubation Date/Time: 01/22/2018 12:08 PM Performed by: Montel Clock, CRNA Pre-anesthesia Checklist: Patient identified, Emergency Drugs available, Suction available, Patient being monitored and Timeout performed Patient Re-evaluated:Patient Re-evaluated prior to induction Oxygen Delivery Method: Circle system utilized Preoxygenation: Pre-oxygenation with 100% oxygen Induction Type: IV induction Ventilation: Mask ventilation without difficulty Laryngoscope Size: Mac and 3 Grade View: Grade I Tube type: Oral Tube size: 7.0 mm Number of attempts: 1 Airway Equipment and Method: Stylet Placement Confirmation: ETT inserted through vocal cords under direct vision,  positive ETCO2 and breath sounds checked- equal and bilateral Secured at: 22 cm Tube secured with: Tape Dental Injury: Teeth and Oropharynx as per pre-operative assessment

## 2018-01-22 NOTE — Consult Note (Signed)
Reason for Consult: Back abscess, early sepsis  Referring Physician: [name] is an 57 y.o. female.  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 Grandville Silos for a back abscess and early sepsis.  The patient states she was in her usual health until about 4 days ago when she felt there was a "pimple" on her back.  She tried hot compresses.  Over the next couple of days she had increasing pain and swelling in the area and developed fatigue.  Yesterday the pain worsened associated with some subjective fever and malaise.  She presented to the emergency department with findings of early sepsis and also was found to have a UTI.  A purulent draining apparent abscess in the mid back was noted.  Ultrasound as below did not show any apparent abscess.  However her symptoms are worse today with enlargement and increased discomfort.  She has had other soft tissue abscesses in the past in the groin and abdominal area.  Past Medical History:  Diagnosis Date  . Diabetes mellitus   . Hypertension   . Obese   . Thyroid disease    Hypothyroid    Past Surgical History:  Procedure Laterality Date  . 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 CERVICAL CONE BIOPSY  1990  . HERNIA REPAIR    . HIP SURGERY     Bilateral replacement  . OOPHORECTOMY     LSO and RSO    Family History  Problem Relation Age of Onset  . Breast cancer Mother        Age 33  . Diabetes Mother   . Liver cancer Mother   . Lung cancer Father   . Colon cancer Neg Hx     Social History:  reports that she has been smoking cigarettes. She has been smoking about 0.50 packs per day. She has never used smokeless tobacco. She reports that she drinks alcohol. She reports that she does not use drugs.  Allergies:  Allergies  Allergen Reactions  . Sulfa Antibiotics Rash    Current Facility-Administered Medications  Medication Dose Route  Frequency Provider Last Rate Last Dose  . 0.9 %  sodium chloride infusion   Intravenous Continuous Eugenie Filler, MD 100 mL/hr at 01/22/18 0800    . acetaminophen (TYLENOL) tablet 650 mg  650 mg Oral Q6H PRN Caren Griffins, MD   650 mg at 01/21/18 1412   Or  . acetaminophen (TYLENOL) suppository 650 mg  650 mg Rectal Q6H PRN Caren Griffins, MD      . alum & mag hydroxide-simeth (MAALOX/MYLANTA) 200-200-20 MG/5ML suspension 30 mL  30 mL Oral Q4H PRN Caren Griffins, MD   30 mL at 01/21/18 2248  . aspirin EC tablet 81 mg  81 mg Oral Daily Caren Griffins, MD   81 mg at 01/22/18 0913  . cefTRIAXone (ROCEPHIN) 2 g in sodium chloride 0.9 % 100 mL IVPB  2 g Intravenous Q24H Caren Griffins, MD   Stopped at 01/22/18 0617  . heparin injection 5,000 Units  5,000 Units Subcutaneous Q8H Caren Griffins, MD   5,000 Units at 01/22/18 0548  . HYDROcodone-acetaminophen (NORCO/VICODIN) 5-325 MG per tablet 1-2 tablet  1-2 tablet Oral Q6H PRN Caren Griffins, MD   2 tablet at 01/21/18 2232  . insulin aspart (novoLOG) injection 0-15 Units  0-15 Units Subcutaneous TID WC Caren Griffins, MD  8 Units at 01/22/18 0841  . insulin aspart (novoLOG) injection 0-5 Units  0-5 Units Subcutaneous QHS Caren Griffins, MD   3 Units at 01/21/18 2234  . insulin glargine (LANTUS) injection 15 Units  15 Units Subcutaneous Jacklynn Ganong, MD   15 Units at 01/22/18 [phone]  . nicotine (NICODERM CQ - dosed in mg/24 hours) patch 21 mg  21 mg Transdermal Daily Caren Griffins, MD   21 mg at 01/22/18 0914  . ondansetron (ZOFRAN) tablet 4 mg  4 mg Oral Q6H PRN Caren Griffins, MD       Or  . ondansetron (ZOFRAN) injection 4 mg  4 mg Intravenous Q6H PRN Caren Griffins, MD      . phenazopyridine (PYRIDIUM) tablet 100 mg  100 mg Oral BID Caren Griffins, MD   100 mg at 01/22/18 0913  . polyethylene glycol (MIRALAX / GLYCOLAX) packet 17 g  17 g Oral Daily PRN Cruzita Lederer, Costin M, MD      . sodium  chloride 0.9 % bolus 1,000 mL  1,000 mL Intravenous Once Eugenie Filler, MD      . thyroid (ARMOUR) tablet 180 mg  180 mg Oral Daily Eugenie Filler, MD      . vancomycin variable dose per unstable renal function (pharmacist dosing)   Does not apply See admin instructions Caren Griffins, MD         Results for orders placed or performed during the hospital encounter of 01/21/18 (from the past 48 hour(s))  I-Stat CG4 Lactic Acid, ED     Status: Abnormal   Collection Time: 01/21/18  5:17 AM  Result Value Ref Range   Lactic Acid, Venous 2.40 (HH) 0.5 - 1.9 mmol/L   Comment NOTIFIED PHYSICIAN   Comprehensive metabolic panel     Status: Abnormal   Collection Time: 01/21/18  5:21 AM  Result Value Ref Range   Sodium 127 (L) 135 - 145 mmol/L   Potassium 3.7 3.5 - 5.1 mmol/L   Chloride 93 (L) 98 - 111 mmol/L   CO2 20 (L) 22 - 32 mmol/L   Glucose, Bld 348 (H) 70 - 99 mg/dL   BUN 40 (H) 6 - 20 mg/dL   Creatinine, Ser 2.14 (H) 0.44 - 1.00 mg/dL   Calcium 9.0 8.9 - 10.3 mg/dL   Total Protein 7.4 6.5 - 8.1 g/dL   Albumin 3.1 (L) 3.5 - 5.0 g/dL   AST 28 15 - 41 U/L   ALT 31 0 - 44 U/L   Alkaline Phosphatase 75 38 - 126 U/L   Total Bilirubin 1.5 (H) 0.3 - 1.2 mg/dL   GFR calc non Af Amer 24 (L) >60 mL/min   GFR calc Af Amer 28 (L) >60 mL/min    Comment: (NOTE) The eGFR has been calculated using the CKD EPI equation. This calculation has not been validated in all clinical situations. eGFR's persistently <60 mL/min signify possible Chronic Kidney Disease.    Anion gap 14 5 - 15    Comment: Performed at [facility], [address] [place], [place] [address]  CBC WITH DIFFERENTIAL     Status: Abnormal   Collection Time: 01/21/18  5:21 AM  Result Value Ref Range   WBC 13.4 (H) 4.0 - 10.5 K/uL   RBC 4.05 3.87 - 5.11 MIL/uL   Hemoglobin 13.0 12.0 - 15.0 g/dL   HCT 37.5 36.0 - 46.0 %   MCV 92.6 80.0 - 100.0  fL   MCH 32.1 26.0 - 34.0 pg   MCHC 34.7 30.0 - 36.0 g/dL    RDW 12.7 11.5 - 15.5 %   Platelets 158 150 - 400 K/uL   nRBC 0.0 0.0 - 0.2 %   Neutrophils Relative % 83 %   Neutro Abs 11.1 (H) 1.7 - 7.7 K/uL   Lymphocytes Relative 4 %   Lymphs Abs 0.5 (L) 0.7 - 4.0 K/uL   Monocytes Relative 9 %   Monocytes Absolute 1.2 (H) 0.1 - 1.0 K/uL   Eosinophils Relative 3 %   Eosinophils Absolute 0.4 0.0 - 0.5 K/uL   Basophils Relative 0 %   Basophils Absolute 0.0 0.0 - 0.1 K/uL   Immature Granulocytes 1 %   Abs Immature Granulocytes 0.15 (H) 0.00 - 0.07 K/uL    Comment: Performed at [facility], [address] [place], [place] [address]  Urinalysis, Routine w reflex microscopic     Status: Abnormal   Collection Time: 01/21/18  6:27 AM  Result Value Ref Range   Color, Urine ORANGE (A) YELLOW    Comment: BIOCHEMICALS MAY BE AFFECTED BY COLOR   APPearance CLOUDY (A) CLEAR   Specific Gravity, Urine >1.030 (H) 1.005 - 1.030   pH 5.0 5.0 - 8.0   Glucose, UA 100 (A) NEGATIVE mg/dL   Hgb urine dipstick MODERATE (A) NEGATIVE   Bilirubin Urine MODERATE (A) NEGATIVE   Ketones, ur NEGATIVE NEGATIVE mg/dL   Protein, ur 30 (A) NEGATIVE mg/dL   Nitrite NEGATIVE NEGATIVE   Leukocytes, UA MODERATE (A) NEGATIVE    Comment: Performed at [facility], [address] [place], [place] [address]  Urinalysis, Microscopic (reflex)     Status: Abnormal   Collection Time: 01/21/18  6:27 AM  Result Value Ref Range   RBC / HPF 11-20 0 - 5 RBC/hpf   WBC, UA >50 0 - 5 WBC/hpf   Bacteria, UA MANY (A) NONE SEEN   Squamous Epithelial / LPF >50 0 - 5   Urine-Other LESS THAN 10 mL OF URINE SUBMITTED     Comment: Performed at [facility], [address] [place], [place] [address]  I-Stat CG4 Lactic Acid, ED     Status: None   Collection Time: 01/21/18  7:51 AM  Result Value Ref Range   Lactic Acid, Venous 1.62 0.5 - 1.9 mmol/L  MRSA PCR Screening     Status: None   Collection Time: 01/21/18 12:51 PM  Result Value Ref Range   MRSA by PCR  NEGATIVE NEGATIVE    Comment:        The GeneXpert MRSA Assay (FDA approved for NASAL specimens only), is one component of a comprehensive MRSA colonization surveillance program. It is not intended to diagnose MRSA infection nor to guide or monitor treatment for MRSA infections. Performed at [facility], [place] [address], [place], [place] [address]   TSH     Status: None   Collection Time: 01/21/18  2:27 PM  Result Value Ref Range   TSH 1.650 0.350 - 4.500 uIU/mL    Comment: Performed by a 3rd Generation assay with a functional sensitivity of <=0.01 uIU/mL. Performed at [facility], [place] [address], [place], [place] [address]   Hemoglobin A1c     Status: Abnormal   Collection Time: 01/21/18  2:27 PM  Result Value Ref Range   Hgb A1c MFr Bld 8.9 (H) 4.8 - 5.6 %  Comment: (NOTE) Pre diabetes:          5.7%-6.4% Diabetes:              >6.4% Glycemic control for   <7.0% adults with diabetes    Mean Plasma Glucose 208.73 mg/dL    Comment: Performed at [place] [address] [place], [place] [address]  Glucose, capillary     Status: Abnormal   Collection Time: 01/21/18  4:25 PM  Result Value Ref Range   Glucose-Capillary 297 (H) 70 - 99 mg/dL   Comment 1 Notify RN    Comment 2 Document in Chart   Glucose, capillary     Status: Abnormal   Collection Time: 01/21/18  6:00 PM  Result Value Ref Range   Glucose-Capillary 300 (H) 70 - 99 mg/dL  Glucose, capillary     Status: Abnormal   Collection Time: 01/21/18  9:27 PM  Result Value Ref Range   Glucose-Capillary 255 (H) 70 - 99 mg/dL    Dg Chest 2 View  Result Date: 01/21/2018 CLINICAL DATA:  57 y/o  F; abscess on the back for 2 days. Fever. EXAM: CHEST - 2 VIEW COMPARISON:  12/10/2016 CT chest. FINDINGS: Normal cardiac silhouette. Aortic atherosclerosis with calcification. Clear lungs. No pleural effusion or pneumothorax. No acute osseous abnormality is evident. IMPRESSION:  No acute pulmonary process identified. Electronically Signed   By: Kristine Garbe M.D.   On: 01/21/2018 05:46   [place] Chest Soft Tissue  Result Date: 01/21/2018 CLINICAL DATA:  Concern for soft tissue abscess midline back region EXAM: ULTRASOUND OF SOFT TISSUES TECHNIQUE: Ultrasound examination of the soft tissues was performed in the area of clinical concern. COMPARISON:  None. FINDINGS: Limited superficial soft tissue ultrasound performed of the posterior thoracic region soft tissues in the area of concern. No significant superficial soft tissue fluid collection, cyst, mass, hematoma, or definite abscess. IMPRESSION: No significant abnormality by soft tissue ultrasound in the thoracic region area of concern. Electronically Signed   By: Jerilynn Mages.  Shick M.D.   On: 01/21/2018 14:10    Review of Systems  Constitutional: Positive for fever and malaise/fatigue. Negative for chills.  Respiratory: Negative.   Cardiovascular: Negative.   Gastrointestinal: Negative.   Genitourinary: Negative for dysuria, frequency and urgency.   Blood pressure (!) 119/44, pulse 93, temperature 98.2 F (36.8 C), temperature source Oral, resp. rate (!) 23, height 5' 3.5" (1.613 m), weight 115.7 kg, SpO2 95 %. Physical Exam General: Alert, pleasant, obese Caucasian female, in no distress Skin: Generally warm and dry.  In the midline back thoracic area is an approximately 10 cm area of induration and tenderness.  Centrally about 5 cm of erythema with central purulent drainage.  Tender. HEENT: No palpable masses or thyromegaly. Sclera nonicteric.  Lymph nodes: No cervical, supraclavicular,nodes palpable. Lungs: Breath sounds clear and equal without increased work of breathing Cardiovascular: Regular rate and rhythm without murmur. No JVD or edema.  Extremities: No edema or joint swelling or deformity. No chronic venous stasis changes. Neurologic: Alert and fully oriented. Gait normal.  Assessment/Plan: 57 year old  diabetic female admitted with signs of early sepsis, currently improved with antibiotics and fluids.  She has an apparent significant UTI but also a large draining abscess in her mid back which I feel is likely the source.  Not sure why the ultrasound was negative but she clearly has a large abscess.  I have recommended incision drainage and debridement.  This is much larger than can be done at  the bedside we will plan to take her to the OR under general anesthesia.  I discussed the indications and nature of the procedure, risks of general anesthesia and medication reactions, bleeding, infection and the fact the wound will be left open.  All questions answered and she agrees to proceed.  Darene Lamer Andilyn Bettcher 01/22/2018, 9:58 AM

## 2018-01-22 NOTE — Progress Notes (Signed)
RN spoke with Dr. Grandville Silos about getting pt. Put back on home thyroid medication. PT. Is now back in [place].

## 2018-01-22 NOTE — Anesthesia Preprocedure Evaluation (Addendum)
Anesthesia Evaluation  Patient identified by MRN, date of birth, ID band Patient awake    Reviewed: Allergy & Precautions, NPO status , Patient's Chart, lab work & pertinent test results  Airway Mallampati: I  TM Distance: >3 FB Neck ROM: Full    Dental no notable dental hx.    Pulmonary Current Smoker,    Pulmonary exam normal breath sounds clear to auscultation       Cardiovascular hypertension, Pt. on medications Normal cardiovascular exam Rhythm:Regular Rate:Normal  ECG: ST, rate 123   Neuro/Psych negative neurological ROS  negative psych ROS   GI/Hepatic Neg liver ROS, GERD  Controlled,  Endo/Other  diabetes, Oral Hypoglycemic AgentsHypothyroidism   Renal/GU negative Renal ROS     Musculoskeletal negative musculoskeletal ROS (+)   Abdominal (+) + obese,   Peds  Hematology  (+) anemia ,   Anesthesia Other Findings back abscess  Reproductive/Obstetrics                            Anesthesia Physical Anesthesia Plan  ASA: III and emergent  Anesthesia Plan: General   Post-op Pain Management:    Induction:   PONV Risk Score and Plan: 2 and Ondansetron, Dexamethasone, Midazolam and Treatment may vary due to age or medical condition  Airway Management Planned: Oral ETT  Additional Equipment:   Intra-op Plan:   Post-operative Plan: Extubation in OR  Informed Consent: I have reviewed the patients History and Physical, chart, labs and discussed the procedure including the risks, benefits and alternatives for the proposed anesthesia with the patient or authorized representative who has indicated his/her understanding and acceptance.   Dental advisory given  Plan Discussed with: CRNA  Anesthesia Plan Comments:        Anesthesia Quick Evaluation

## 2018-01-23 ENCOUNTER — Encounter (HOSPITAL_COMMUNITY): Payer: Self-pay | Admitting: General Surgery

## 2018-01-23 DIAGNOSIS — B9561 Methicillin susceptible Staphylococcus aureus infection as the cause of diseases classified elsewhere: Secondary | ICD-10-CM

## 2018-01-23 DIAGNOSIS — L02212 Cutaneous abscess of back [any part, except buttock]: Secondary | ICD-10-CM

## 2018-01-23 DIAGNOSIS — E118 Type 2 diabetes mellitus with unspecified complications: Secondary | ICD-10-CM

## 2018-01-23 DIAGNOSIS — F17210 Nicotine dependence, cigarettes, uncomplicated: Secondary | ICD-10-CM

## 2018-01-23 DIAGNOSIS — N179 Acute kidney failure, unspecified: Secondary | ICD-10-CM

## 2018-01-23 DIAGNOSIS — E669 Obesity, unspecified: Secondary | ICD-10-CM

## 2018-01-23 LAB — CBC WITH DIFFERENTIAL/PLATELET
Abs Immature Granulocytes: 0.06 10*3/uL (ref 0.00–0.07)
BASOS PCT: 0 %
Basophils Absolute: 0 10*3/uL (ref 0.0–0.1)
EOS PCT: 0 %
Eosinophils Absolute: 0 10*3/uL (ref 0.0–0.5)
HCT: 31.8 % — ABNORMAL LOW (ref 36.0–46.0)
HEMOGLOBIN: 10.6 g/dL — AB (ref 12.0–15.0)
Immature Granulocytes: 1 %
LYMPHS PCT: 10 %
Lymphs Abs: 0.7 10*3/uL (ref 0.7–4.0)
MCH: 32 pg (ref 26.0–34.0)
MCHC: 33.3 g/dL (ref 30.0–36.0)
MCV: 96.1 fL (ref 80.0–100.0)
MONO ABS: 0.7 10*3/uL (ref 0.1–1.0)
Monocytes Relative: 9 %
Neutro Abs: 5.8 10*3/uL (ref 1.7–7.7)
Neutrophils Relative %: 80 %
Platelets: 144 10*3/uL — ABNORMAL LOW (ref 150–400)
RBC: 3.31 MIL/uL — AB (ref 3.87–5.11)
RDW: 12.9 % (ref 11.5–15.5)
WBC: 7.3 10*3/uL (ref 4.0–10.5)
nRBC: 0 % (ref 0.0–0.2)

## 2018-01-23 LAB — BASIC METABOLIC PANEL
Anion gap: 6 (ref 5–15)
BUN: 16 mg/dL (ref 6–20)
CHLORIDE: 108 mmol/L (ref 98–111)
CO2: 21 mmol/L — ABNORMAL LOW (ref 22–32)
Calcium: 8 mg/dL — ABNORMAL LOW (ref 8.9–10.3)
Creatinine, Ser: 0.8 mg/dL (ref 0.44–1.00)
GFR calc Af Amer: >60 mL/min (ref >60)
GFR calc non Af Amer: >60 mL/min (ref >60)
GLUCOSE: 259 mg/dL — AB (ref 70–99)
POTASSIUM: 4.5 mmol/L (ref 3.5–5.1)
SODIUM: 135 mmol/L (ref 135–145)

## 2018-01-23 LAB — GLUCOSE, CAPILLARY
GLUCOSE-CAPILLARY: 176 mg/dL — AB (ref 70–99)
GLUCOSE-CAPILLARY: 185 mg/dL — AB (ref 70–99)
GLUCOSE-CAPILLARY: 208 mg/dL — AB (ref 70–99)
Glucose-Capillary: 224 mg/dL — ABNORMAL HIGH (ref 70–99)

## 2018-01-23 LAB — MAGNESIUM: MAGNESIUM: 1.9 mg/dL (ref 1.7–2.4)

## 2018-01-23 MED ORDER — INSULIN ASPART 100 UNIT/ML ~~LOC~~ SOLN
4.0000 [IU] | Freq: Three times a day (TID) | SUBCUTANEOUS | Status: DC
  Administered 2018-01-23 – 2018-01-24 (×4): 4 [IU] via SUBCUTANEOUS

## 2018-01-23 MED ORDER — LIVING WELL WITH DIABETES BOOK
Freq: Once | Status: AC
  Administered 2018-01-23: 18:00:00
  Filled 2018-01-23: qty 1

## 2018-01-23 MED ORDER — INSULIN GLARGINE 100 UNIT/ML ~~LOC~~ SOLN
5.0000 [IU] | Freq: Once | SUBCUTANEOUS | Status: AC
  Administered 2018-01-23: 5 [IU] via SUBCUTANEOUS
  Filled 2018-01-23: qty 0.05

## 2018-01-23 MED ORDER — INSULIN GLARGINE 100 UNIT/ML ~~LOC~~ SOLN
20.0000 [IU] | Freq: Every day | SUBCUTANEOUS | Status: DC
  Administered 2018-01-24: 20 [IU] via SUBCUTANEOUS
  Filled 2018-01-23: qty 0.2

## 2018-01-23 NOTE — Progress Notes (Signed)
The patient told this RN while washing up that she felt pressure in her left groin/pannus. The patient applied a warm washcloth, and felt relief of the area, noting significant purulent drainage from area she could not see on a washcloth. The patient did not notify this RN until she was back sitting in the chair, an hour after her washup. This RN educated the patient on importance of notifying the provider about this type of information. The area looks like a small, surfaced abscess that has been discharged and started to scab, with red surrounding tissue, about an inch in length. Dr. Grandville Silos was notified and told this RN to pass on to morning staff to let surgery/ID know tomorrow. Will continue to monitor for any additional areas of concern.

## 2018-01-23 NOTE — Anesthesia Postprocedure Evaluation (Signed)
Anesthesia Post Note  Patient: Margaret Carey  Procedure(s) Performed: IRRIGATION AND DEBRIDEMENT BACK ABSCESS (N/A Back)     Patient location during evaluation: PACU Anesthesia Type: General Level of consciousness: awake and alert Pain management: pain level controlled Vital Signs Assessment: post-procedure vital signs reviewed and stable Respiratory status: spontaneous breathing, nonlabored ventilation, respiratory function stable and patient connected to nasal cannula oxygen Cardiovascular status: blood pressure returned to baseline and stable Postop Assessment: no apparent nausea or vomiting Anesthetic complications: no    Last Vitals:  Vitals:   01/23/18 1817 01/23/18 1931  BP: (!) 146/53   Pulse:    Resp:    Temp:  36.7 C  SpO2:      Last Pain:  Vitals:   01/23/18 1931  TempSrc: Oral  PainSc:                  Maudry Zeidan P Huda Petrey

## 2018-01-23 NOTE — Progress Notes (Signed)
1 Day Post-Op    CC: sepsis  Subjective: She is up in the chair and looks much better this AM.  We took her dressing down and we will start wet to dry dressings.  It still has some necrosis but it should clean up with dressing changes.  Objective: Vital signs in last 24 hours: Temp:  [97.5 F (36.4 C)-99.8 F (37.7 C)] 98.8 F (37.1 C) (10/21 0800) Pulse Rate:  [78-112] 78 (10/21 0818) Resp:  [10-29] 19 (10/21 0818) BP: (76-135)/(41-61) 102/61 (10/21 0818) SpO2:  [92 %-99 %] 98 % (10/21 0818) Last BM Date: 01/19/18 240 PO 2818 IV 1350 urine  Afebrile, VSS Glucose -  259      Intake/Output from previous day: 10/20 0701 - 10/21 0700 In: 3061.3 [P.O.:240; I.V.:2818; IV Piggyback:3.3] Out: 1360 [Urine:1350; Blood:10] Intake/Output this shift: Total I/O In: -  Out: 800 [Urine:800]  General appearance: alert, cooperative and no distress Skin: Skin color, texture, turgor normal. No rashes or lesions or picture below.      open wound 2.5 x 3 x 4 cm deep   Lab Results:  Recent Labs    01/22/18 0944 01/23/18 0420  WBC 9.1 7.3  HGB 11.0* 10.6*  HCT 32.6* 31.8*  PLT 127* 144*    BMET Recent Labs    01/22/18 0944 01/23/18 0420  NA 136 135  K 3.9 4.5  CL 105 108  CO2 22 21*  GLUCOSE 249* 259*  BUN 21* 16  CREATININE 0.87 0.80  CALCIUM 8.6* 8.0*   PT/INR No results for input(s): LABPROT, INR in the last 72 hours.  Recent Labs  Lab 01/21/18 0521 01/22/18 0944  AST 28 21  ALT 31 31  ALKPHOS 75 80  BILITOT 1.5* 0.9  PROT 7.4 6.8  ALBUMIN 3.1* 2.8*     Lipase  No results found for: LIPASE   Medications: . aspirin EC  81 mg Oral Daily  . heparin  5,000 Units Subcutaneous Q8H  . insulin aspart  0-15 Units Subcutaneous TID WC  . insulin aspart  0-5 Units Subcutaneous QHS  . insulin aspart  4 Units Subcutaneous TID WC  . [START ON 01/24/2018] insulin glargine  20 Units Subcutaneous Daily  . nicotine  21 mg Transdermal Daily  . phenazopyridine   100 mg Oral BID  . thyroid  180 mg Oral Daily   . sodium chloride 125 mL/hr at 01/23/18 0847  . cefTRIAXone (ROCEPHIN)  IV Stopped (01/23/18 0640)  . sodium chloride    . vancomycin Stopped (01/22/18 1644)   Anti-infectives (From admission, onward)   Start     Dose/Rate Route Frequency Ordered Stop   01/22/18 1400  vancomycin (VANCOCIN) 1,250 mg in sodium chloride 0.9 % 250 mL IVPB     1,250 mg 166.7 mL/hr over 90 Minutes Intravenous Every 24 hours 01/22/18 1241     01/22/18 0600  cefTRIAXone (ROCEPHIN) 2 g in sodium chloride 0.9 % 100 mL IVPB     2 g 200 mL/hr over 30 Minutes Intravenous Every 24 hours 01/21/18 1311     01/21/18 1337  vancomycin variable dose per unstable renal function (pharmacist dosing)  Status:  Discontinued      Does not apply See admin instructions 01/21/18 1337 01/22/18 1246   01/21/18 0715  vancomycin (VANCOCIN) IVPB 1000 mg/200 mL premix     1,000 mg 200 mL/hr over 60 Minutes Intravenous  Once 01/21/18 0600 01/21/18 0838   01/21/18 0600  vancomycin (VANCOCIN) IVPB 1000 mg/200 mL  premix     1,000 mg 200 mL/hr over 60 Minutes Intravenous  Once 01/21/18 0600 01/21/18 0705   01/21/18 0545  vancomycin (VANCOCIN) IVPB 1000 mg/200 mL premix  Status:  Discontinued     1,000 mg 200 mL/hr over 60 Minutes Intravenous  Once 01/21/18 0541 01/21/18 0559   01/21/18 0545  cefTRIAXone (ROCEPHIN) 2 g in sodium chloride 0.9 % 100 mL IVPB  Status:  Discontinued     2 g 200 mL/hr over 30 Minutes Intravenous Every 24 hours 01/21/18 0541 01/21/18 1337     Assessment/Plan Severe sepsis with septic shock UTI Hypothyroid Hypertension Hyponatremia Type 2 diabetes -A1c 8.9  Deep mid back subcutaneous abscess Incision and drainage, 01/22/2018 Dr. Excell Seltzer  FEN: IV fluids/carb mod diet ID: Rocephin/vancomycin 10/19 =>> day2  DVT: SCDs/heparin Follow-up: TBD  Plan:  Wet to dry dressings, and she can follow up in the office  LOS: 2 days     Margaret Carey 01/23/2018 [phone]

## 2018-01-23 NOTE — Care Management Note (Signed)
Case Management Note  Patient Details  Name: Margaret Carey MRN: [id number] Date of Birth: 1960/09/04  Subjective/Objective:                  S/p post I&D of skin and subcutaneous tissues of the back pt is a diabetic uti is present at this time  Action/Plan: S/p or to sdu due to hypotension, boluses of lr given to stabilize, iv rocephin and iv vancomycin. Will follow for progression of care and clinical status. Will follow for case management needs none present at this time. Expected Discharge Date:                  Expected Discharge Plan:  Home/Self Care  In-House Referral:     Discharge planning Services  CM Consult  Post Acute Care Choice:    Choice offered to:     DME Arranged:    DME Agency:     HH Arranged:    HH Agency:     Status of Service:  In process, will continue to follow  If discussed at Long Length of Stay Meetings, dates discussed:    Additional Comments:  Leeroy Cha, RN 01/23/2018, 9:05 AM

## 2018-01-23 NOTE — Consult Note (Addendum)
[place] for Infectious Disease    Date of Admission:  01/21/2018   Total days of antibiotics: 2 vanco/ceftrixone               Reason for Consult: Abscess    Referring Provider: Thompson   Assessment: Abscess DM2 (uncontrolled A1C 8.9%) AKI  Plan: 1. Will stop ceftriaxone, leave on vanco alone for now.  2. watch BCx 3. Watch abscess Cx for final ID 4. Her UCx was polymicrobial. Query if contaminated, as well she was dehydrated. She was/is asx.   Thank you so much for this interesting consult,  Principal Problem:   Severe sepsis with septic shock [facility]) Active Problems:   Thyroid disease   Hypertension   Sepsis ([place])   Acute lower UTI   AKI (acute kidney injury) ([place])   Hyponatremia   DM (diabetes mellitus) ([place])   Abscess of back   . aspirin EC  81 mg Oral Daily  . heparin  5,000 Units Subcutaneous Q8H  . insulin aspart  0-15 Units Subcutaneous TID WC  . insulin aspart  0-5 Units Subcutaneous QHS  . insulin aspart  4 Units Subcutaneous TID WC  . [START ON 01/24/2018] insulin glargine  20 Units Subcutaneous Daily  . nicotine  21 mg Transdermal Daily  . phenazopyridine  100 mg Oral BID  . thyroid  180 mg Oral Daily    HPI: Margaret Carey is a 57 y.o. female with hx of DM2 (x 10 years), obesity with boil on her back 3 days pta. This increased in size and she also developed f/c, weakness. She came to ED on 10-19 and had temp 102.4 and WBC 13.4.  She was started on vanco/ceftriaxone. She underwent I & D of an 8-10 cm abscess on 10-20.  Her fever resolved. Her abscess Cx shows staph aureus. Her BCx are ngtd.    Review of Systems: Review of Systems  Constitutional: Positive for chills and fever.  Eyes: Negative for blurred vision.  Respiratory: Negative for sputum production.   Gastrointestinal: Negative for constipation and diarrhea.  Genitourinary: Negative for dysuria, frequency and urgency.  Neurological: Negative for sensory change.  Please  see HPI. All other systems reviewed and negative.   Past Medical History:  Diagnosis Date  . Diabetes mellitus   . Hypertension   . Obese   . Thyroid disease    Hypothyroid    Social History   Tobacco Use  . Smoking status: Current Every Day Smoker    Packs/day: 0.50    Types: Cigarettes  . Smokeless tobacco: Never Used  Substance Use Topics  . Alcohol use: Yes    Alcohol/week: 0.0 standard drinks    Comment: Rare  . Drug use: No    Family History  Problem Relation Age of Onset  . Breast cancer Mother        Age 6  . Diabetes Mother   . Liver cancer Mother   . Lung cancer Father   . Colon cancer Neg Hx      Medications:  Scheduled: . aspirin EC  81 mg Oral Daily  . heparin  5,000 Units Subcutaneous Q8H  . insulin aspart  0-15 Units Subcutaneous TID WC  . insulin aspart  0-5 Units Subcutaneous QHS  . insulin aspart  4 Units Subcutaneous TID WC  . [START ON 01/24/2018] insulin glargine  20 Units Subcutaneous Daily  . nicotine  21 mg Transdermal Daily  . phenazopyridine  100 mg Oral BID  . thyroid  180 mg Oral Daily    Abtx:  Anti-infectives (From admission, onward)   Start     Dose/Rate Route Frequency Ordered Stop   01/22/18 1400  vancomycin (VANCOCIN) 1,250 mg in sodium chloride 0.9 % 250 mL IVPB     1,250 mg 166.7 mL/hr over 90 Minutes Intravenous Every 24 hours 01/22/18 1241     01/22/18 0600  cefTRIAXone (ROCEPHIN) 2 g in sodium chloride 0.9 % 100 mL IVPB     2 g 200 mL/hr over 30 Minutes Intravenous Every 24 hours 01/21/18 1311     01/21/18 1337  vancomycin variable dose per unstable renal function (pharmacist dosing)  Status:  Discontinued      Does not apply See admin instructions 01/21/18 1337 01/22/18 1246   01/21/18 0715  vancomycin (VANCOCIN) IVPB 1000 mg/200 mL premix     1,000 mg 200 mL/hr over 60 Minutes Intravenous  Once 01/21/18 0600 01/21/18 0838   01/21/18 0600  vancomycin (VANCOCIN) IVPB 1000 mg/200 mL premix     1,000 mg 200 mL/hr  over 60 Minutes Intravenous  Once 01/21/18 0600 01/21/18 0705   01/21/18 0545  vancomycin (VANCOCIN) IVPB 1000 mg/200 mL premix  Status:  Discontinued     1,000 mg 200 mL/hr over 60 Minutes Intravenous  Once 01/21/18 0541 01/21/18 0559   01/21/18 0545  cefTRIAXone (ROCEPHIN) 2 g in sodium chloride 0.9 % 100 mL IVPB  Status:  Discontinued     2 g 200 mL/hr over 30 Minutes Intravenous Every 24 hours 01/21/18 0541 01/21/18 1337        OBJECTIVE: Blood pressure 102/61, pulse 78, temperature 98.3 F (36.8 C), temperature source Oral, resp. rate 19, height 5' 3.5" (1.613 m), weight 115.7 kg, SpO2 98 %.  Physical Exam  Constitutional: She appears well-developed and well-nourished.  HENT:  Mouth/Throat: No oropharyngeal exudate.  Eyes: Pupils are equal, round, and reactive to light. EOM are normal.  Neck: Normal range of motion. Neck supple.  Cardiovascular: Normal rate, regular rhythm and normal heart sounds.  Pulmonary/Chest: Effort normal and breath sounds normal.  Abdominal: Soft. Bowel sounds are normal. She exhibits no distension. There is no tenderness.  Musculoskeletal: She exhibits no edema.  Lymphadenopathy:    She has no cervical adenopathy.  Neurological: No sensory deficit.  Skin:     Psychiatric: She has a normal mood and affect.    Lab Results Results for orders placed or performed during the hospital encounter of 01/21/18 (from the past 48 hour(s))  MRSA PCR Screening     Status: None   Collection Time: 01/21/18 12:51 PM  Result Value Ref Range   MRSA by PCR NEGATIVE NEGATIVE    Comment:        The GeneXpert MRSA Assay (FDA approved for NASAL specimens only), is one component of a comprehensive MRSA colonization surveillance program. It is not intended to diagnose MRSA infection nor to guide or monitor treatment for MRSA infections. Performed at [facility], [place] [address] [place], [place] [address]   HIV antibody (Routine Testing)      Status: None   Collection Time: 01/21/18  2:27 PM  Result Value Ref Range   HIV Screen 4th Generation wRfx Non Reactive Non Reactive    Comment: (NOTE) Performed At: [facility] [place], [place] [id number] Rush Farmer MD [id number]   TSH     Status: None   Collection Time: 01/21/18  2:27 PM  Result Value Ref Range   TSH 1.650 0.350 - 4.500 uIU/mL    Comment: Performed by a 3rd Generation assay with a functional sensitivity of <=0.01 uIU/mL. Performed at [facility], [place] [address], [place], [place] [address]   Hemoglobin A1c     Status: Abnormal   Collection Time: 01/21/18  2:27 PM  Result Value Ref Range   Hgb A1c MFr Bld 8.9 (H) 4.8 - 5.6 %    Comment: (NOTE) Pre diabetes:          5.7%-6.4% Diabetes:              >6.4% Glycemic control for   <7.0% adults with diabetes    Mean Plasma Glucose 208.73 mg/dL    Comment: Performed at [facility], [place] [address] [place], [place] [address]  Glucose, capillary     Status: Abnormal   Collection Time: 01/21/18  4:25 PM  Result Value Ref Range   Glucose-Capillary 297 (H) 70 - 99 mg/dL   Comment 1 Notify RN    Comment 2 Document in Chart   Glucose, capillary     Status: Abnormal   Collection Time: 01/21/18  6:00 PM  Result Value Ref Range   Glucose-Capillary 300 (H) 70 - 99 mg/dL  Glucose, capillary     Status: Abnormal   Collection Time: 01/21/18  9:27 PM  Result Value Ref Range   Glucose-Capillary 255 (H) 70 - 99 mg/dL  Glucose, capillary     Status: Abnormal   Collection Time: 01/22/18  8:13 AM  Result Value Ref Range   Glucose-Capillary 258 (H) 70 - 99 mg/dL  Comprehensive metabolic panel     Status: Abnormal   Collection Time: 01/22/18  9:44 AM  Result Value Ref Range   Sodium 136 135 - 145 mmol/L   Potassium 3.9 3.5 - 5.1 mmol/L   Chloride 105 98 - 111 mmol/L   CO2 22 22 - 32 mmol/L   Glucose, Bld 249 (H) 70 - 99 mg/dL   BUN 21 (H) 6 - 20 mg/dL   Creatinine,  Ser 0.87 0.44 - 1.00 mg/dL   Calcium 8.6 (L) 8.9 - 10.3 mg/dL   Total Protein 6.8 6.5 - 8.1 g/dL   Albumin 2.8 (L) 3.5 - 5.0 g/dL   AST 21 15 - 41 U/L   ALT 31 0 - 44 U/L   Alkaline Phosphatase 80 38 - 126 U/L   Total Bilirubin 0.9 0.3 - 1.2 mg/dL   GFR calc non Af Amer >60 >60 mL/min   GFR calc Af Amer >60 >60 mL/min    Comment: (NOTE) The eGFR has been calculated using the CKD EPI equation. This calculation has not been validated in all clinical situations. eGFR's persistently <60 mL/min signify possible Chronic Kidney Disease.    Anion gap 9 5 - 15    Comment: Performed at [facility], [place] [address] [place], [place] [address]  CBC     Status: Abnormal   Collection Time: 01/22/18  9:44 AM  Result Value Ref Range   WBC 9.1 4.0 - 10.5 K/uL   RBC 3.46 (L) 3.87 - 5.11 MIL/uL   Hemoglobin 11.0 (L) 12.0 - 15.0 g/dL   HCT 32.6 (L) 36.0 - 46.0 %   MCV 94.2 80.0 - 100.0 fL   MCH 31.8 26.0 - 34.0 pg   MCHC 33.7 30.0 - 36.0 g/dL   RDW 12.9 11.5 - 15.5 %   Platelets 127 (L) 150 -  400 K/uL   nRBC 0.0 0.0 - 0.2 %    Comment: Performed at [facility], [place] [address], [place], [place] [address]  Magnesium     Status: None   Collection Time: 01/22/18  9:44 AM  Result Value Ref Range   Magnesium 2.0 1.7 - 2.4 mg/dL    Comment: Performed at [facility], [place] [address], [place], [place] [address]  Aerobic/Anaerobic Culture (surgical/deep wound)     Status: None (Preliminary result)   Collection Time: 01/22/18 12:27 PM  Result Value Ref Range   Specimen Description      ABSCESS BACK Performed at [facility], [place] [address] [place], [place] [address]    Special Requests      NONE Performed at [facility], [place] [address], [place], [place] [address]    Gram Stain      MODERATE WBC PRESENT, PREDOMINANTLY PMN FEW GRAM POSITIVE COCCI    Culture      MODERATE STAPHYLOCOCCUS AUREUS SUSCEPTIBILITIES TO  FOLLOW Performed at [facility], [place] [address] [place], [place] [address]    Report Status PENDING   Glucose, capillary     Status: Abnormal   Collection Time: 01/22/18  1:02 PM  Result Value Ref Range   Glucose-Capillary 211 (H) 70 - 99 mg/dL  Sodium, urine, random     Status: None   Collection Time: 01/22/18  3:40 PM  Result Value Ref Range   Sodium, Ur 51 mmol/L    Comment: Performed at [facility], [place] [address], [place], [place] [address]  Creatinine, urine, random     Status: None   Collection Time: 01/22/18  3:40 PM  Result Value Ref Range   Creatinine, Urine 66.58 mg/dL    Comment: Performed at [facility], [place] [address] [place], [place] [address]  Glucose, capillary     Status: Abnormal   Collection Time: 01/22/18  4:11 PM  Result Value Ref Range   Glucose-Capillary 248 (H) 70 - 99 mg/dL  Glucose, capillary     Status: Abnormal   Collection Time: 01/22/18  9:18 PM  Result Value Ref Range   Glucose-Capillary 304 (H) 70 - 99 mg/dL  CBC with Differential/Platelet     Status: Abnormal   Collection Time: 01/23/18  4:20 AM  Result Value Ref Range   WBC 7.3 4.0 - 10.5 K/uL   RBC 3.31 (L) 3.87 - 5.11 MIL/uL   Hemoglobin 10.6 (L) 12.0 - 15.0 g/dL   HCT 31.8 (L) 36.0 - 46.0 %   MCV 96.1 80.0 - 100.0 fL   MCH 32.0 26.0 - 34.0 pg   MCHC 33.3 30.0 - 36.0 g/dL   RDW 12.9 11.5 - 15.5 %   Platelets 144 (L) 150 - 400 K/uL   nRBC 0.0 0.0 - 0.2 %   Neutrophils Relative % 80 %   Neutro Abs 5.8 1.7 - 7.7 K/uL   Lymphocytes Relative 10 %   Lymphs Abs 0.7 0.7 - 4.0 K/uL   Monocytes Relative 9 %   Monocytes Absolute 0.7 0.1 - 1.0 K/uL   Eosinophils Relative 0 %   Eosinophils Absolute 0.0 0.0 - 0.5 K/uL   Basophils Relative 0 %   Basophils Absolute 0.0 0.0 - 0.1 K/uL   Immature Granulocytes 1 %   Abs Immature Granulocytes 0.06 0.00 - 0.07 K/uL    Comment: Performed at [facility], [place] [address]  [place], [place] [address]  Basic metabolic panel     Status: Abnormal   Collection Time: 01/23/18  4:20 AM  Result Value Ref Range   Sodium 135 135 - 145 mmol/L   Potassium 4.5 3.5 - 5.1 mmol/L   Chloride 108 98 - 111 mmol/L   CO2 21 (L) 22 - 32 mmol/L   Glucose, Bld 259 (H) 70 - 99 mg/dL   BUN 16 6 - 20 mg/dL   Creatinine, Ser 0.80 0.44 - 1.00 mg/dL   Calcium 8.0 (L) 8.9 - 10.3 mg/dL   GFR calc non Af Amer >60 >60 mL/min   GFR calc Af Amer >60 >60 mL/min    Comment: (NOTE) The eGFR has been calculated using the CKD EPI equation. This calculation has not been validated in all clinical situations. eGFR's persistently <60 mL/min signify possible Chronic Kidney Disease.    Anion gap 6 5 - 15    Comment: Performed at [facility] [place] [address] [place], [place] [address]  Magnesium     Status: None   Collection Time: 01/23/18  4:20 AM  Result Value Ref Range   Magnesium 1.9 1.7 - 2.4 mg/dL    Comment: Performed at [facility], [place] [address] [place], [place] [address]  Glucose, capillary     Status: Abnormal   Collection Time: 01/23/18  7:48 AM  Result Value Ref Range   Glucose-Capillary 224 (H) 70 - 99 mg/dL   Comment 1 Notify RN    Comment 2 Document in Chart   Glucose, capillary     Status: Abnormal   Collection Time: 01/23/18 11:40 AM  Result Value Ref Range   Glucose-Capillary 176 (H) 70 - 99 mg/dL   Comment 1 Notify RN    Comment 2 Document in Chart       Component Value Date/Time   SDES  01/22/2018 1227    ABSCESS BACK Performed at [place] [address], [place], [place] [address]    [place]  01/22/2018 1227    NONE Performed at [facility], [place] [address], [place], [place] [address]    CULT  01/22/2018 1227    MODERATE STAPHYLOCOCCUS AUREUS SUSCEPTIBILITIES TO FOLLOW Performed at [facility], [place] [address], [place],  [address]    REPTSTATUS PENDING 01/22/2018 1227   [place]  Chest Soft Tissue  Result Date: 01/21/2018 CLINICAL DATA:  Concern for soft tissue abscess midline back region EXAM: ULTRASOUND OF SOFT TISSUES TECHNIQUE: Ultrasound examination of the soft tissues was performed in the area of clinical concern. COMPARISON:  None. FINDINGS: Limited superficial soft tissue ultrasound performed of the posterior thoracic region soft tissues in the area of concern. No significant superficial soft tissue fluid collection, cyst, mass, hematoma, or definite abscess. IMPRESSION: No significant abnormality by soft tissue ultrasound in the thoracic region area of concern. Electronically Signed   By: Jerilynn Mages.  Shick M.D.   On: 01/21/2018 14:10   Recent Results (from the past 240 hour(s))  Blood Culture (routine x 2)     Status: None (Preliminary result)   Collection Time: 01/21/18  5:12 AM  Result Value Ref Range Status   Specimen Description   Final    BLOOD RIGHT ARM Performed at [facility], [place] [place], [place] [address]    Special Requests   Final    BOTTLES DRAWN AEROBIC AND ANAEROBIC Blood Culture adequate volume Performed at [facility], [address] [place], [place] [address]    Culture   Final    NO GROWTH 2 DAYS Performed at [place] [address] [place], [place] [address]    Report Status PENDING  Incomplete  Blood Culture (routine x 2)     Status: None (Preliminary result)   Collection Time: 01/21/18  5:21 AM  Result Value Ref Range Status   Specimen Description   Final    BLOOD LEFT ARM Performed at [facility], [place] [place], [place] [address]    Special Requests   Final    BOTTLES DRAWN AEROBIC AND ANAEROBIC Blood Culture results may not be optimal due to an excessive volume of blood received in culture bottles Performed at [facility], [place] [place], [place] [address]    Culture   Final    NO GROWTH 2 DAYS Performed at [facility], [place]  [address], [place], [place] [address]    Report Status PENDING  Incomplete  Urine culture     Status: Abnormal   Collection Time: 01/21/18  6:27 AM  Result Value Ref Range Status   Specimen Description   Final    URINE, CLEAN CATCH Performed at [facility], [place] [place], [place] [address]    Special Requests   Final    NONE Performed at [facility], [place] [place], [place] [address]    Culture MULTIPLE SPECIES PRESENT, SUGGEST RECOLLECTION (A)  Final   Report Status 01/22/2018 FINAL  Final  MRSA PCR Screening     Status: None   Collection Time: 01/21/18 12:51 PM  Result Value Ref Range Status   MRSA by PCR NEGATIVE NEGATIVE Final    Comment:        The GeneXpert MRSA Assay (FDA approved for NASAL specimens only), is one component of a comprehensive MRSA colonization surveillance program. It is not intended to diagnose MRSA infection nor to guide or monitor treatment for MRSA infections. Performed at [facility], [place] [address], [place], [place] [address]   Aerobic/Anaerobic Culture (surgical/deep wound)     Status: None (Preliminary result)   Collection Time: 01/22/18 12:27 PM  Result Value Ref Range Status   Specimen Description   Final    ABSCESS BACK Performed at [facility], [place] [address], [place], [place] [address]    Special Requests   Final    NONE Performed at [facility], [place] [address] [place], [place] [address]    Gram Stain   Final    MODERATE WBC PRESENT, PREDOMINANTLY PMN FEW GRAM POSITIVE COCCI    Culture   Final    MODERATE STAPHYLOCOCCUS AUREUS SUSCEPTIBILITIES TO FOLLOW Performed at [facility], [place] [address] [place], [place] [address]    Report Status PENDING  Incomplete    Microbiology: Recent Results (from the past 240 hour(s))  Blood Culture (routine x 2)     Status: None (Preliminary result)   Collection Time: 01/21/18  5:12 AM  Result Value  Ref Range Status   Specimen Description   Final    BLOOD RIGHT ARM Performed at [facility], [place] [place], [place] [address]    Special Requests   Final    BOTTLES DRAWN AEROBIC AND ANAEROBIC Blood Culture adequate volume Performed at [facility], [place],  [place], [place] [address]    Culture   Final    NO GROWTH 2 DAYS Performed at [facility], [place] [address], [place], [place] [address]    Report Status PENDING  Incomplete  Blood Culture (routine x 2)     Status: None (Preliminary result)   Collection Time: 01/21/18  5:21 AM  Result Value Ref Range Status   Specimen Description   Final    BLOOD LEFT ARM Performed at [facility], [place] [place], [place] [address]    Special Requests   Final    BOTTLES DRAWN AEROBIC AND ANAEROBIC Blood Culture results may not be optimal due to an excessive volume of blood received in culture bottles Performed at [facility], [place] [place], [place] [address]    Culture   Final    NO GROWTH 2 DAYS Performed at [facility], [place] [address], [place], [place] [address]    Report Status PENDING  Incomplete  Urine culture     Status: Abnormal   Collection Time: 01/21/18  6:27 AM  Result Value Ref Range Status   Specimen Description   Final    URINE, CLEAN CATCH Performed at [facility], [place] [place], [place] [address]    Special Requests   Final    NONE Performed at [facility], [place] [place], [place] [address]    Culture MULTIPLE SPECIES PRESENT, SUGGEST RECOLLECTION (A)  Final   Report Status 01/22/2018 FINAL  Final  MRSA PCR Screening     Status: None   Collection Time: 01/21/18 12:51 PM  Result Value Ref Range Status   MRSA by PCR NEGATIVE NEGATIVE Final    Comment:        The GeneXpert MRSA Assay (FDA approved for NASAL specimens only), is one component of a comprehensive MRSA  colonization surveillance program. It is not intended to diagnose MRSA infection nor to guide or monitor treatment for MRSA infections. Performed at [facility], [place] [address], [place], [place] [address]   Aerobic/Anaerobic Culture (surgical/deep wound)     Status: None (Preliminary result)   Collection Time: 01/22/18 12:27 PM  Result Value Ref Range Status   Specimen Description   Final    ABSCESS BACK Performed at [facility], [place] [address], [place], [place] [address]    Special Requests   Final    NONE Performed at [facility], [place] [address], [place], [place] [address]    Gram Stain   Final    MODERATE WBC PRESENT, PREDOMINANTLY PMN FEW GRAM POSITIVE COCCI    Culture   Final    MODERATE STAPHYLOCOCCUS AUREUS SUSCEPTIBILITIES TO FOLLOW Performed at [facility], [place] [address], [place],  [address]    Report Status PENDING  Incomplete    Radiographs and labs were personally reviewed by me.   Bobby Rumpf, MD [facility] for [facility] [place] [facility] [phone] 01/23/2018, 12:20 PM

## 2018-01-23 NOTE — Progress Notes (Addendum)
PROGRESS NOTE    Margaret Carey  JEH:[id number] DOB: [birth year] DOA: 01/21/2018 PCP: Shon Baton, MD    Brief Narrative:   Margaret Carey is a 57 y.o. female with medical history significant of hypothyroidism, diabetes mellitus, hypertension, obesity, who presented to the hospital with chief complaint of fever and chills and general weakness for the last 2 to 3 days.  She was also been complaining for the past week of a boil on her back which has been increasing in size and has becoming more tender.  She also had noticed that over the last 24 hours her urine has become a little bit darker and the smell has changed.  She denied any chest pain, denies any shortness of breath.  She denied any abdominal pain, denied any nausea or vomiting.  She denied any lightheadedness or dizziness.  No numbness or tingling.  ED Course: In the emergency room she was febrile up to 102.4, heart rate is in the 1 teens, and she did have few episodes of hypotension with blood pressure as low as 70s which responded to fluids.  She was asymptomatic during that time.  Blood work reveals sodium 127, creatinine of 2.1, lactic acid was initially 2.4 but normalized with fluids, white count 13.4.  Bedside ultrasound per EDP did not show any abscess in her boil on her back.  Urinalysis was evidence of a UTI.  She was given vancomycin and ceftriaxone and hospitalist asked to admit patient.   Assessment & Plan:   Principal Problem:   Severe sepsis with septic shock (HCC) Active Problems:   Acute lower UTI   Abscess of back   Thyroid disease   Hypertension   Sepsis ([place])   AKI (acute kidney injury) ([place])   Hyponatremia   DM (diabetes mellitus) ([place])  1 severe sepsis with septic shock secondary to deep subcutaneous abscess on her mid back. Patient presented with severe sepsis noted to have a fever of 102.4, noted to be hypotensive with systolic blood pressures in the 70s, creatinine of 2.1, lactic acid initially elevated at  2.4, sodium of 127, urinalysis consistent with UTI. Ultrasound of soft tissue of the back was done which was negative for abscess.  However on examination patient has significant induration of her mid to lower back with erythema, tenderness to palpation and pus been able to be expressed from the site.  Patient with hypotension and borderline blood pressure.  General surgery was consulted and patient subsequently underwent incision, drainage and debridement of skin and subcutaneous tissue of the back with cultures sent and concern for early necrosis.  Continue empiric IV vancomycin and IV Rocephin.  IV fluids.  Supportive care.  General surgery following.  Consult with ID for antibiotic duration and recommendations.  Continue aggressive diabetes management.  Follow.    2.  Deep subcutaneous abscess of mid back On examination on 01/22/2018, patient noted to have significant induration, erythema, tenderness to palpation with pus oozing from the site.  Blood cultures pending.  Ultrasound of soft tissues of the back done 01/21/2018 with no significant abnormality.  On clinical exam patient definitely has an abscess with pus oozing from site with significant induration as noted on 01/22/2018.  General surgery was consulted and patient subsequently underwent incision drainage and debridement of skin and subcutaneous tissue of the back and the general anesthesia on 01/22/2018.  It was noted that abscess cavity had purulent material which was evacuated and cultured and also concern for possible early necrosis.  Wound  cultures pending.  Patient afebrile however blood pressure low to borderline.  Patient improving clinically.  Continue empiric IV vancomycin.  Will consult with ID for antibiotic recommendations and duration.  General surgery following and I appreciate the input and recommendations.  3.  UTI Patient with dysuria with some clinical improvement.  Urine cultures negative.  Dysuria improving.  On IV Rocephin.     4.  Hypothyroidism Resumed home dose armour.  5.  Acute renal failure Patient noted on admission to have a creatinine of 2.14.  Likely secondary to a prerenal azotemia as patient noted on admission to be hypotensive with systolic blood pressures in the 70s in the setting of ACE inhibitor and diuretics.  Urine output of 1.350 L over the past 24 hours.  Urinalysis done with protein of 30 specific gravity greater than 1.030.  Urine sodium of 51, urine creatinine of 66.58.  Renal function improving with hydration.  Strict I's and O's.  Daily weights.  Continue IV fluids and follow.  Continue to hold ACE inhibitor and diuretics.    6.  Hyponatremia Likely secondary to hypovolemic hyponatremia.  Improving with hydration.  Follow.   7.  Hypertension Antihypertensive medications on hold.  Blood pressure was low post incision, drainage and debridement on 01/22/2018.  Blood pressure borderline.  Continue IV fluids.  Follow.  Continue to hold due to borderline blood pressure sepsis.  8.  Diabetes mellitus type 2 Hemoglobin A1c 8.9.  CBG of 304 last night.  CBG this morning is 224.  Increase Lantus to 20 units daily.  Placed on meal coverage NovoLog 4 units 3 times daily with meals.  Continue sliding scale insulin.  Consult with diabetic coordinator.      DVT prophylaxis: Heparin Code Status: Full Family Communication: Updated patient.  No family at bedside. Disposition Plan: Remain in stepdown unit for now.   Consultants:   General surgery: Dr. Excell Seltzer 01/22/2018  Procedures:   Chest x-ray 01/21/2018  Ultrasound of soft tissue of the back 01/21/2018  Incision, drainage and debridement of skin and subcutaneous tissue of the back per Dr. Excell Seltzer 01/22/2018  Antimicrobials:   IV Rocephin 01/21/2018  IV vancomycin 01/21/2018   Subjective: Patient sitting up in chair.  Feeling better after I and D was done on 01/22/2018.  Less pain.  Denies any chest pain no shortness of breath.  No  abdominal pain.  Patient states dysuria is improving.  No flank pain.  Patient asking when she can go home.  Objective: Vitals:   01/23/18 0405 01/23/18 0602 01/23/18 0800 01/23/18 0818  BP:  134/61  102/61  Pulse:  87  78  Resp:  14  19  Temp: (!) 97.5 F (36.4 C)  98.8 F (37.1 C)   TempSrc: Axillary  Oral   SpO2:  99%  98%  Weight:      Height:        Intake/Output Summary (Last 24 hours) at 01/23/2018 0908 Last data filed at 01/23/2018 0730 Gross per 24 hour  Intake 2930.25 ml  Output 2160 ml  Net 770.25 ml   Filed Weights   01/21/18 0459  Weight: 115.7 kg    Examination:  General exam: Appears calm and comfortable  Respiratory system: Lungs clear to auscultation bilaterally.  No wheezes, no crackles, no rhonchi.  Respiratory effort normal. Cardiovascular system: Regular rate and rhythm no murmurs rubs or gallops.  No JVD.  No lower extremity edema.   Gastrointestinal system: Abdomen is soft, nontender, nondistended, positive bowel sounds.  No rebound.  No guarding.  Back: Status post  I and D, with bandage over wound area.  Central nervous system: Alert and oriented. No focal neurological deficits. Extremities: Symmetric 5 x 5 power. Skin: No rashes, lesions or ulcers Psychiatry: Judgement and insight appear normal. Mood & affect appropriate.     Data Reviewed: I have personally reviewed following labs and imaging studies  CBC: Recent Labs  Lab 01/21/18 0521 01/22/18 0944 01/23/18 0420  WBC 13.4* 9.1 7.3  NEUTROABS 11.1*  --  5.8  HGB 13.0 11.0* 10.6*  HCT 37.5 32.6* 31.8*  MCV 92.6 94.2 96.1  PLT 158 127* 161*   Basic Metabolic Panel: Recent Labs  Lab 01/21/18 0521 01/22/18 0944 01/23/18 0420  NA 127* 136 135  K 3.7 3.9 4.5  CL 93* 105 108  CO2 20* 22 21*  GLUCOSE 348* 249* 259*  BUN 40* 21* 16  CREATININE 2.14* 0.87 0.80  CALCIUM 9.0 8.6* 8.0*  MG  --  2.0 1.9   GFR: Estimated Creatinine Clearance: 93.7 mL/min (by C-G formula based on  SCr of 0.8 mg/dL). Liver Function Tests: Recent Labs  Lab 01/21/18 0521 01/22/18 0944  AST 28 21  ALT 31 31  ALKPHOS 75 80  BILITOT 1.5* 0.9  PROT 7.4 6.8  ALBUMIN 3.1* 2.8*   No results for input(s): LIPASE, AMYLASE in the last 168 hours. No results for input(s): AMMONIA in the last 168 hours. Coagulation Profile: No results for input(s): INR, PROTIME in the last 168 hours. Cardiac Enzymes: No results for input(s): CKTOTAL, CKMB, CKMBINDEX, TROPONINI in the last 168 hours. BNP (last 3 results) No results for input(s): PROBNP in the last 8760 hours. HbA1C: Recent Labs    01/21/18 1427  HGBA1C 8.9*   CBG: Recent Labs  Lab 01/22/18 0813 01/22/18 1302 01/22/18 1611 01/22/18 2118 01/23/18 0748  GLUCAP 258* 211* 248* 304* 224*   Lipid Profile: No results for input(s): CHOL, HDL, LDLCALC, TRIG, CHOLHDL, LDLDIRECT in the last 72 hours. Thyroid Function Tests: Recent Labs    01/21/18 1427  TSH 1.650   Anemia Panel: No results for input(s): VITAMINB12, FOLATE, FERRITIN, TIBC, IRON, RETICCTPCT in the last 72 hours. Sepsis Labs: Recent Labs  Lab 01/21/18 0517 01/21/18 0751  LATICACIDVEN 2.40* 1.62    Recent Results (from the past 240 hour(s))  Blood Culture (routine x 2)     Status: None (Preliminary result)   Collection Time: 01/21/18  5:12 AM  Result Value Ref Range Status   Specimen Description   Final    BLOOD RIGHT ARM Performed at [facility], [address] [place], [place] [address]    Special Requests   Final    BOTTLES DRAWN AEROBIC AND ANAEROBIC Blood Culture adequate volume Performed at [facility], [address] [place], [place] [address]    Culture   Final    NO GROWTH 2 DAYS Performed at [facility], [place] [address], [place], [place] [address]    Report Status PENDING  Incomplete  Blood Culture (routine x 2)     Status: None (Preliminary result)   Collection Time: 01/21/18  5:21 AM  Result Value Ref Range  Status   Specimen Description   Final    BLOOD LEFT ARM Performed at [facility], [address] [place], [place] [address]    Special Requests   Final    BOTTLES DRAWN AEROBIC AND ANAEROBIC Blood Culture results may  not be optimal due to an excessive volume of blood received in culture bottles Performed at [facility], [address] [place], [place] [address]    Culture   Final    NO GROWTH 2 DAYS Performed at [facility], [place] [address], [place], [place] [address]    Report Status PENDING  Incomplete  Urine culture     Status: Abnormal   Collection Time: 01/21/18  6:27 AM  Result Value Ref Range Status   Specimen Description   Final    URINE, CLEAN CATCH Performed at [facility], [address] [place], [place] [address]    Special Requests   Final    NONE Performed at [facility], [address] [place], [place] [address]    Culture MULTIPLE SPECIES PRESENT, SUGGEST RECOLLECTION (A)  Final   Report Status 01/22/2018 FINAL  Final  MRSA PCR Screening     Status: None   Collection Time: 01/21/18 12:51 PM  Result Value Ref Range Status   MRSA by PCR NEGATIVE NEGATIVE Final    Comment:        The GeneXpert MRSA Assay (FDA approved for NASAL specimens only), is one component of a comprehensive MRSA colonization surveillance program. It is not intended to diagnose MRSA infection nor to guide or monitor treatment for MRSA infections. Performed at [facility], [place] [address] [place], [place] [address]   Aerobic/Anaerobic Culture (surgical/deep wound)     Status: None (Preliminary result)   Collection Time: 01/22/18 12:27 PM  Result Value Ref Range Status   Specimen Description   Final    ABSCESS BACK Performed at [facility], [place] [address], [place], [place] [address]    Special Requests   Final    NONE Performed at [facility], [place] [address] [place], [place]  [address]    Gram Stain   Final    MODERATE WBC PRESENT, PREDOMINANTLY PMN FEW GRAM POSITIVE COCCI Performed at [facility], [place] [address], [place],  [address]    Culture PENDING  Incomplete   Report Status PENDING  Incomplete         Radiology Studies: [place] Chest Soft Tissue  Result Date: 01/21/2018 CLINICAL DATA:  Concern for soft tissue abscess midline back region EXAM: ULTRASOUND OF SOFT TISSUES TECHNIQUE: Ultrasound examination of the soft tissues was performed in the area of clinical concern. COMPARISON:  None. FINDINGS: Limited superficial soft tissue ultrasound performed of the posterior thoracic region soft tissues in the area of concern. No significant superficial soft tissue fluid collection, cyst, mass, hematoma, or definite abscess. IMPRESSION: No significant abnormality by soft tissue ultrasound in the thoracic region area of concern. Electronically Signed   By: Jerilynn Mages.  Shick M.D.   On: 01/21/2018 14:10        Scheduled Meds: . aspirin EC  81 mg Oral Daily  . heparin  5,000 Units Subcutaneous Q8H  . insulin aspart  0-15 Units Subcutaneous TID WC  . insulin aspart  0-5 Units Subcutaneous QHS  . insulin aspart  4 Units Subcutaneous TID WC  . [START ON 01/24/2018] insulin glargine  20 Units Subcutaneous Daily  . nicotine  21 mg Transdermal Daily  . phenazopyridine  100 mg Oral BID  . thyroid  180 mg Oral Daily   Continuous Infusions: . sodium chloride 125 mL/hr at 01/23/18 0847  . cefTRIAXone (ROCEPHIN)  IV Stopped (01/23/18 0640)  .  sodium chloride    . vancomycin Stopped (01/22/18 1644)     LOS: 2 days    Time spent: 40 minutes    Irine Seal, MD [facility] Pager [phone]  If 7PM-7AM, please contact night-coverage [email or web address] Password [email or web address] 01/23/2018, 9:08 AM

## 2018-01-24 DIAGNOSIS — B958 Unspecified staphylococcus as the cause of diseases classified elsewhere: Secondary | ICD-10-CM

## 2018-01-24 DIAGNOSIS — L02214 Cutaneous abscess of groin: Secondary | ICD-10-CM

## 2018-01-24 LAB — GLUCOSE, CAPILLARY
GLUCOSE-CAPILLARY: 158 mg/dL — AB (ref 70–99)
Glucose-Capillary: 162 mg/dL — ABNORMAL HIGH (ref 70–99)
Glucose-Capillary: 192 mg/dL — ABNORMAL HIGH (ref 70–99)
Glucose-Capillary: 207 mg/dL — ABNORMAL HIGH (ref 70–99)

## 2018-01-24 LAB — CBC WITH DIFFERENTIAL/PLATELET
Abs Immature Granulocytes: 0.1 10*3/uL — ABNORMAL HIGH (ref 0.00–0.07)
BASOS PCT: 1 %
Basophils Absolute: 0.1 10*3/uL (ref 0.0–0.1)
EOS ABS: 0.3 10*3/uL (ref 0.0–0.5)
Eosinophils Relative: 5 %
HEMATOCRIT: 30.6 % — AB (ref 36.0–46.0)
Hemoglobin: 9.9 g/dL — ABNORMAL LOW (ref 12.0–15.0)
Immature Granulocytes: 2 %
Lymphocytes Relative: 32 %
Lymphs Abs: 2 10*3/uL (ref 0.7–4.0)
MCH: 31.4 pg (ref 26.0–34.0)
MCHC: 32.4 g/dL (ref 30.0–36.0)
MCV: 97.1 fL (ref 80.0–100.0)
MONO ABS: 0.6 10*3/uL (ref 0.1–1.0)
MONOS PCT: 10 %
NEUTROS PCT: 50 %
Neutro Abs: 3.1 10*3/uL (ref 1.7–7.7)
PLATELETS: 158 10*3/uL (ref 150–400)
RBC: 3.15 MIL/uL — AB (ref 3.87–5.11)
RDW: 13.2 % (ref 11.5–15.5)
WBC: 6.2 10*3/uL (ref 4.0–10.5)
nRBC: 0 % (ref 0.0–0.2)

## 2018-01-24 LAB — MAGNESIUM: Magnesium: 1.6 mg/dL — ABNORMAL LOW (ref 1.7–2.4)

## 2018-01-24 LAB — BASIC METABOLIC PANEL
Anion gap: 8 (ref 5–15)
BUN: 14 mg/dL (ref 6–20)
CALCIUM: 8.1 mg/dL — AB (ref 8.9–10.3)
CO2: 22 mmol/L (ref 22–32)
Chloride: 107 mmol/L (ref 98–111)
Creatinine, Ser: 0.74 mg/dL (ref 0.44–1.00)
GFR calc Af Amer: >60 mL/min (ref >60)
GFR calc non Af Amer: >60 mL/min (ref >60)
GLUCOSE: 180 mg/dL — AB (ref 70–99)
Potassium: 3.8 mmol/L (ref 3.5–5.1)
Sodium: 137 mmol/L (ref 135–145)

## 2018-01-24 MED ORDER — INSULIN ASPART 100 UNIT/ML ~~LOC~~ SOLN
6.0000 [IU] | Freq: Three times a day (TID) | SUBCUTANEOUS | Status: DC
  Administered 2018-01-24 – 2018-01-25 (×4): 6 [IU] via SUBCUTANEOUS

## 2018-01-24 MED ORDER — INSULIN STARTER KIT- PEN NEEDLES (ENGLISH)
1.0000 | Freq: Once | Status: AC
  Administered 2018-01-24: 1
  Filled 2018-01-24: qty 1

## 2018-01-24 MED ORDER — FUROSEMIDE 10 MG/ML IJ SOLN
20.0000 mg | Freq: Once | INTRAMUSCULAR | Status: AC
  Administered 2018-01-24: 20 mg via INTRAVENOUS
  Filled 2018-01-24: qty 2

## 2018-01-24 MED ORDER — HEPARIN SODIUM (PORCINE) 5000 UNIT/ML IJ SOLN
5000.0000 [IU] | Freq: Three times a day (TID) | INTRAMUSCULAR | Status: DC
  Administered 2018-01-24: 5000 [IU] via SUBCUTANEOUS
  Filled 2018-01-24 (×3): qty 1

## 2018-01-24 MED ORDER — LIDOCAINE HCL (PF) 2 % IJ SOLN
0.0000 mL | Freq: Once | INTRAMUSCULAR | Status: DC | PRN
  Filled 2018-01-24: qty 20

## 2018-01-24 MED ORDER — INSULIN GLARGINE 100 UNIT/ML ~~LOC~~ SOLN
22.0000 [IU] | Freq: Every day | SUBCUTANEOUS | Status: DC
  Administered 2018-01-25: 22 [IU] via SUBCUTANEOUS
  Filled 2018-01-24: qty 0.22

## 2018-01-24 MED ORDER — MAGNESIUM SULFATE 4 GM/100ML IV SOLN
4.0000 g | Freq: Once | INTRAVENOUS | Status: AC
  Administered 2018-01-24: 4 g via INTRAVENOUS
  Filled 2018-01-24: qty 100

## 2018-01-24 NOTE — Progress Notes (Signed)
PROGRESS NOTE    [name]  KGY:[id number] DOB: 01-19-1961 DOA: 01/21/2018 PCP: Shon Baton, MD    Brief Narrative:   Margaret Carey is a 57 y.o. female with medical history significant of hypothyroidism, diabetes mellitus, hypertension, obesity, who presented to the hospital with chief complaint of fever and chills and general weakness for the last 2 to 3 days.  She was also been complaining for the past week of a boil on her back which has been increasing in size and has becoming more tender.  She also had noticed that over the last 24 hours her urine has become a little bit darker and the smell has changed.  She denied any chest pain, denies any shortness of breath.  She denied any abdominal pain, denied any nausea or vomiting.  She denied any lightheadedness or dizziness.  No numbness or tingling.  ED Course: In the emergency room she was febrile up to 102.4, heart rate is in the 1 teens, and she did have few episodes of hypotension with blood pressure as low as 70s which responded to fluids.  She was asymptomatic during that time.  Blood work reveals sodium 127, creatinine of 2.1, lactic acid was initially 2.4 but normalized with fluids, white count 13.4.  Bedside ultrasound per EDP did not show any abscess in her boil on her back.  Urinalysis was evidence of a UTI.  She was given vancomycin and ceftriaxone and hospitalist asked to admit patient.   Assessment & Plan:   Principal Problem:   Severe sepsis with septic shock (HCC) Active Problems:   Acute lower UTI   Abscess of back   Thyroid disease   Hypertension   Sepsis ([place])   AKI (acute kidney injury) ([place])   Hyponatremia   DM (diabetes mellitus) ([place])  1 severe sepsis with septic shock secondary to deep subcutaneous abscess on her mid back. Patient presented with severe sepsis noted to have a fever of 102.4, noted to be hypotensive with systolic blood pressures in the 70s, creatinine of 2.1, lactic acid initially elevated at  2.4, sodium of 127, urinalysis consistent with UTI. Ultrasound of soft tissue of the back was done which was negative for abscess.  However on examination patient has significant induration of her mid to lower back with erythema, tenderness to palpation and pus been able to be expressed from the site.  Patient with hypotension and borderline blood pressure on admission in the first few days of hospitalization.  Blood pressure improved..  General surgery was consulted and patient subsequently underwent incision, drainage and debridement of skin and subcutaneous tissue of the back with cultures sent and concern for early necrosis.  Continue empiric IV vancomycin.  IV Rocephin has been discontinued.  Saline lock IV fluids.  Supportive care.  ID and general surgery following.  Continue aggressive diabetes management.  Follow.   2.  Deep subcutaneous abscess of mid back/abscess in left groin On examination on 01/22/2018, patient noted to have significant induration, erythema, tenderness to palpation with pus oozing from the site.  Blood cultures pending.  Ultrasound of soft tissues of the back done 01/21/2018 with no significant abnormality.  On clinical exam patient definitely has an abscess with pus oozing from site with significant induration as noted on 01/22/2018.  General surgery was consulted and patient subsequently underwent incision drainage and debridement of skin and subcutaneous tissue of the back and the general anesthesia on 01/22/2018.  It was noted that abscess cavity had purulent material which  was evacuated and cultured and also concern for possible early necrosis.  Wound cultures pending.  Patient afebrile however blood pressure low to borderline which has improved.  Patient improving clinically.  Patient also now complaining of abscess in the left groin area that appeared yesterday afternoon with some purulent discharge.  Patient has been seen by general surgery and plans for incision and drainage  at bedside this afternoon.  IV Rocephin has been discontinued per ID.  Continue empiric IV vancomycin.  Cultures pending.  Appreciate ID and general surgery input and recommendations.    3.  UTI Patient with dysuria with some clinical improvement.  Urine cultures negative.  Dysuria improving.  Discontinue Pyridium.  IV Rocephin has been discontinued per ID.  4.  Hypothyroidism Continue home dose armour.  5.  Acute renal failure Patient noted on admission to have a creatinine of 2.14.  Likely secondary to a prerenal azotemia as patient noted on admission to be hypotensive with systolic blood pressures in the 70s in the setting of ACE inhibitor and diuretics.  Urine output of 2.8 L over the past 24 hours.  Urinalysis done with protein of 30 specific gravity greater than 1.030.  Urine sodium of 51, urine creatinine of 66.58.  Renal function improved with hydration.  Strict I's and O's.  Daily weights.  Continue IV fluids and follow.  Continue to hold ACE inhibitor and diuretics.    6.  Hyponatremia Likely secondary to hypovolemic hyponatremia.  Improving with hydration.  Saline lock IV fluids.  Follow.   7.  Hypertension Antihypertensive medications on hold.  Blood pressure was low on admission and post incision, drainage and debridement on 01/22/2018.  Blood pressure has improved.  Saline lock IV fluids.  Give a dose of Lasix 20 mg IV x1.  Start Norvasc.   8.  Diabetes mellitus type 2 Hemoglobin A1c 8.9.  CBG of 162 this morning.  Increase Lantus to 22 units daily.  Increase meal coverage NovoLog to 6 units 3 times daily.  Continue sliding scale insulin.  Diabetic coordinator following.      DVT prophylaxis: Heparin Code Status: Full Family Communication: Updated patient.  No family at bedside. Disposition Plan: Transfer to [place].    Consultants:   General surgery: Dr. Excell Seltzer 01/22/2018  ID: Dr. Johnnye Sima 01/23/2018  Procedures:   Chest x-ray 01/21/2018  Ultrasound of soft  tissue of the back 01/21/2018  Incision, drainage and debridement of skin and subcutaneous tissue of the back per Dr. Excell Seltzer 01/22/2018  Antimicrobials:   IV Rocephin 01/21/2018>>>>> 01/23/2018  IV vancomycin 01/21/2018   Subjective: Patient sitting up in chair.  States she is feeling better.  Starting to feel tight in her hands feeling she might be getting volume overloaded.  Complaining of left groin abscess losing.  States flank pain has improved.  Dysuria has improved.  No chest pain.  No shortness of breath.   Objective: Vitals:   01/23/18 1931 01/24/18 0000 01/24/18 0409 01/24/18 0800  BP:      Pulse:      Resp:      Temp: 98.1 F (36.7 C) 98 F (36.7 C) 98.2 F (36.8 C) 98.1 F (36.7 C)  TempSrc: Oral Oral Oral Oral  SpO2:      Weight:      Height:        Intake/Output Summary (Last 24 hours) at 01/24/2018 0956 Last data filed at 01/23/2018 2103 Gross per 24 hour  Intake 865 ml  Output 2000 ml  Net -  1135 ml   Filed Weights   01/21/18 0459  Weight: 115.7 kg    Examination:  General exam: Appears calm and comfortable  Respiratory system: CTAB no wheezes, no crackles, no rhonchi.  Respiratory effort normal. Cardiovascular system: RRR no murmurs rubs or gallops.  No JVD.  No lower extremity edema.  Gastrointestinal system: Abdomen is nontender, nondistended, soft, positive bowel sounds.  No rebound.  No guarding.  Back: Status post  I and D, with bandage over wound area.  Central nervous system: Alert and oriented. No focal neurological deficits. Extremities: Symmetric 5 x 5 power. Skin: No rashes, lesions or ulcers Psychiatry: Judgement and insight appear normal. Mood & affect appropriate.     Data Reviewed: I have personally reviewed following labs and imaging studies  CBC: Recent Labs  Lab 01/21/18 0521 01/22/18 0944 01/23/18 0420 01/24/18 0336  WBC 13.4* 9.1 7.3 6.2  NEUTROABS 11.1*  --  5.8 3.1  HGB 13.0 11.0* 10.6* 9.9*  HCT 37.5 32.6*  31.8* 30.6*  MCV 92.6 94.2 96.1 97.1  PLT 158 127* 144* 297   Basic Metabolic Panel: Recent Labs  Lab 01/21/18 0521 01/22/18 0944 01/23/18 0420 01/24/18 0336  NA 127* 136 135 137  K 3.7 3.9 4.5 3.8  CL 93* 105 108 107  CO2 20* 22 21* 22  GLUCOSE 348* 249* 259* 180*  BUN 40* 21* 16 14  CREATININE 2.14* 0.87 0.80 0.74  CALCIUM 9.0 8.6* 8.0* 8.1*  MG  --  2.0 1.9 1.6*   GFR: Estimated Creatinine Clearance: 93.7 mL/min (by C-G formula based on SCr of 0.74 mg/dL). Liver Function Tests: Recent Labs  Lab 01/21/18 0521 01/22/18 0944  AST 28 21  ALT 31 31  ALKPHOS 75 80  BILITOT 1.5* 0.9  PROT 7.4 6.8  ALBUMIN 3.1* 2.8*   No results for input(s): LIPASE, AMYLASE in the last 168 hours. No results for input(s): AMMONIA in the last 168 hours. Coagulation Profile: No results for input(s): INR, PROTIME in the last 168 hours. Cardiac Enzymes: No results for input(s): CKTOTAL, CKMB, CKMBINDEX, TROPONINI in the last 168 hours. BNP (last 3 results) No results for input(s): PROBNP in the last 8760 hours. HbA1C: Recent Labs    01/21/18 1427  HGBA1C 8.9*   CBG: Recent Labs  Lab 01/23/18 0748 01/23/18 1140 01/23/18 1710 01/23/18 2106 01/24/18 0754  GLUCAP 224* 176* 185* 208* 162*   Lipid Profile: No results for input(s): CHOL, HDL, LDLCALC, TRIG, CHOLHDL, LDLDIRECT in the last 72 hours. Thyroid Function Tests: Recent Labs    01/21/18 1427  TSH 1.650   Anemia Panel: No results for input(s): VITAMINB12, FOLATE, FERRITIN, TIBC, IRON, RETICCTPCT in the last 72 hours. Sepsis Labs: Recent Labs  Lab 01/21/18 0517 01/21/18 0751  LATICACIDVEN 2.40* 1.62    Recent Results (from the past 240 hour(s))  Blood Culture (routine x 2)     Status: None (Preliminary result)   Collection Time: 01/21/18  5:12 AM  Result Value Ref Range Status   Specimen Description   Final    BLOOD RIGHT ARM Performed at [facility], [address] [place],  [address]     Special Requests   Final    BOTTLES DRAWN AEROBIC AND ANAEROBIC Blood Culture adequate volume Performed at [facility], [address] [place], [place] [address]    Culture   Final    NO GROWTH 3 DAYS Performed at [facility], [place] [address],  [place], [place] [address]    Report Status PENDING  Incomplete  Blood Culture (routine x 2)     Status: None (Preliminary result)   Collection Time: 01/21/18  5:21 AM  Result Value Ref Range Status   Specimen Description   Final    BLOOD LEFT ARM Performed at [facility], [address] [place], [place] [address]    Special Requests   Final    BOTTLES DRAWN AEROBIC AND ANAEROBIC Blood Culture results may not be optimal due to an excessive volume of blood received in culture bottles Performed at [facility], [address] [place], [place] [address]    Culture   Final    NO GROWTH 3 DAYS Performed at [facility], [place] [address] [place], [place] [address]    Report Status PENDING  Incomplete  Urine culture     Status: Abnormal   Collection Time: 01/21/18  6:27 AM  Result Value Ref Range Status   Specimen Description   Final    URINE, CLEAN CATCH Performed at [facility], [address] [place], [place] [address]    Special Requests   Final    NONE Performed at [facility], [address] [place], [place] [address]    Culture MULTIPLE SPECIES PRESENT, SUGGEST RECOLLECTION (A)  Final   Report Status 01/22/2018 FINAL  Final  MRSA PCR Screening     Status: None   Collection Time: 01/21/18 12:51 PM  Result Value Ref Range Status   MRSA by PCR NEGATIVE NEGATIVE Final    Comment:        The GeneXpert MRSA Assay (FDA approved for NASAL specimens only), is one component of a comprehensive MRSA colonization surveillance program. It is not intended to diagnose MRSA infection nor to guide or monitor treatment for MRSA infections. Performed at [facility], [place] [address], [place], [place] [address]   Aerobic/Anaerobic Culture (surgical/deep wound)     Status: None (Preliminary result)   Collection Time: 01/22/18 12:27 PM  Result Value Ref Range Status   Specimen Description   Final    ABSCESS BACK Performed at [facility], [place] [address] [place], [place] [address]    Special Requests   Final    NONE Performed at [facility], [place] [address], [place], [place] [address]    Gram Stain   Final    MODERATE WBC PRESENT, PREDOMINANTLY PMN FEW GRAM POSITIVE COCCI    Culture   Final    MODERATE STAPHYLOCOCCUS AUREUS SUSCEPTIBILITIES TO FOLLOW Performed at [facility], [place] [address] [place], [place] [address]    Report Status PENDING  Incomplete         Radiology Studies: No results found.      Scheduled Meds: . aspirin EC  81 mg Oral Daily  . heparin  5,000 Units Subcutaneous Q8H  . insulin aspart  0-15 Units Subcutaneous TID WC  . insulin aspart  0-5 Units Subcutaneous QHS  . insulin aspart  6 Units Subcutaneous TID WC  . [START ON 01/25/2018] insulin glargine  22 Units Subcutaneous Daily  . insulin starter kit- pen needles  1 kit Other Once  . nicotine  21 mg Transdermal Daily  . phenazopyridine  100 mg Oral BID  . thyroid  180 mg Oral Daily   Continuous Infusions: . magnesium sulfate 1 - 4 g bolus IVPB    .  sodium chloride    . vancomycin Stopped (01/23/18 1821)     LOS: 3 days    Time spent: 40 minutes    Irine Seal, MD [facility] Pager [phone]  If 7PM-7AM, please contact night-coverage [email or web address] Password [facility] 01/24/2018, 9:56 AM

## 2018-01-24 NOTE — Procedures (Addendum)
Incision and Drainage Procedure Note  Pre-operative Diagnosis: Left groin abscess  Post-operative Diagnosis: same  Indications: infection  Anesthesia: 1% plain lidocaine  Procedure Details  The procedure, risks and complications have been discussed in detail (including, but not limited to airway compromise, infection, bleeding) with the patient, and the patient agrees to proceed with the procedure.  The skin was sterilely prepped over the affected area in the usual fashion. After adequate local anesthesia, I&D with an #11 blade was performed on the left groin abscess. Some purulent but more bloody drainage was expressed. Cultures were taken and will be sent to the lab. Wound packed with packing strips and dry dressing applied. The patient was observed until stable.   Condition: Tolerated procedure well  Complications: none.  Wellington Hampshire, [facility] Surgery 01/24/2018, 3:21 PM Pager: [phone] Mon 7:00 am -11:30 AM Tues-Fri 7:00 am-4:30 pm Sat-Sun 7:00 am-11:30 am

## 2018-01-24 NOTE — Progress Notes (Signed)
[name] will follow pt with ID team to support home IV ABX at DC if needed/ordered.  If patient discharges after hours, please call [phone].   Margaret Carey 01/24/2018, 2:43 PM

## 2018-01-24 NOTE — Progress Notes (Signed)
2 Days Post-Op    CC:  Sepsis with abscess  Subjective: The site on her back looks about the same as yesterday, start wet to dry and gently cleaning inside of wound with 4 x 4.  She started draining from a new site yesterday left groin.  It's about 2 cm purulent drainage is coming out.  I think we need to open it more.  She says this is new as of yesterday.  Objective: Vital signs in last 24 hours: Temp:  [98 F (36.7 C)-98.7 F (37.1 C)] 98.2 F (36.8 C) (10/22 0409) Pulse Rate:  [78-90] 90 (10/21 1435) Resp:  [19-22] 22 (10/21 1435) BP: (102-146)/(53-61) 146/53 (10/21 1817) SpO2:  [94 %-98 %] 94 % (10/21 1435) Last BM Date: 01/19/18 640 Po IV 875 Urine 2800 No BM recorded Afebrile, VSS Lab are OK, Glucose 180 - improving WBC 6.2    Intake/Output from previous day: 10/21 0701 - 10/22 0700 In: 1515 [P.O.:640; I.V.:875] Out: 2800 [Urine:2800] Intake/Output this shift: No intake/output data recorded.  General appearance: alert, cooperative and no distress Skin: Back site is about the same as yesterday, new site left groin about 2 cm in diameter open and draining brown purulent malodorus fluid.   Lab Results:  Recent Labs    01/23/18 0420 01/24/18 0336  WBC 7.3 6.2  HGB 10.6* 9.9*  HCT 31.8* 30.6*  PLT 144* 158    BMET Recent Labs    01/23/18 0420 01/24/18 0336  NA 135 137  K 4.5 3.8  CL 108 107  CO2 21* 22  GLUCOSE 259* 180*  BUN 16 14  CREATININE 0.80 0.74  CALCIUM 8.0* 8.1*   PT/INR No results for input(s): LABPROT, INR in the last 72 hours.  Recent Labs  Lab 01/21/18 0521 01/22/18 0944  AST 28 21  ALT 31 31  ALKPHOS 75 80  BILITOT 1.5* 0.9  PROT 7.4 6.8  ALBUMIN 3.1* 2.8*     Lipase  No results found for: LIPASE   Medications: . aspirin EC  81 mg Oral Daily  . heparin  5,000 Units Subcutaneous Q8H  . insulin aspart  0-15 Units Subcutaneous TID WC  . insulin aspart  0-5 Units Subcutaneous QHS  . insulin aspart  4 Units  Subcutaneous TID WC  . insulin glargine  20 Units Subcutaneous Daily  . nicotine  21 mg Transdermal Daily  . phenazopyridine  100 mg Oral BID  . thyroid  180 mg Oral Daily   . sodium chloride 125 mL/hr at 01/24/18 0102  . magnesium sulfate 1 - 4 g bolus IVPB    . sodium chloride    . vancomycin Stopped (01/23/18 1821)   Specimen Description ABSCESS BACK  Performed at [facility], [place] [address] [place], [place] [address]   Special Requests NONE  Performed at [facility], [place] [address] [place], [place] [address]   Gram Stain MODERATE WBC PRESENT, PREDOMINANTLY PMN  FEW GRAM POSITIVE COCCI   Culture MODERATE STAPHYLOCOCCUS AUREUS    Urine culture: Multiple species Blood cultures: No growth  Assessment/Plan  Severe sepsis with septic shock UTI Hypothyroid Hypertension Hyponatremia Type 2 diabetes -A1c 8.9  Deep mid back subcutaneous abscess Incision and drainage, 01/22/2018 Dr. Marland Kitchen [place] POD #2  FEN: IV fluids/carb mod diet ID: Rocephin 10/19-10/21/vancomycin 10/19 =>> day 3  DVT: SCDs/heparin Follow-up: TBD  Plan:  Wet to dry dressing for the back.  We ill open site in the left groin more at  the bedside later today. Appreciate Dr. Algis Downs assistance with antibiotics.  LOS: 3 days    Margaret Carey 01/24/2018 [id number]

## 2018-01-24 NOTE — Progress Notes (Signed)
Inpatient Diabetes Program Recommendations  AACE/ADA: New Consensus Statement on Inpatient Glycemic Control (2015)  Target Ranges:  Prepandial:   less than 140 mg/dL      Peak postprandial:   less than 180 mg/dL (1-2 hours)      Critically ill patients:  140 - 180 mg/dL   Lab Results  Component Value Date   GLUCAP 162 (H) 01/24/2018   HGBA1C 8.9 (H) 01/21/2018    Review of Glycemic Control  Diabetes history: DM2 Outpatient Diabetes medications: Amaryl 4 mg bid, Janumet 00/4599 mg bid, Trulicity 7.74 mg 1x/week Current orders for Inpatient glycemic control: Lantus 20 units QHS, Novolog 0-15 units tidwc and hs + 4 units tidwc  HgbA1C - 8.9% - uncontrolled.  Inpatient Diabetes Program Recommendations:     Agree with orders. If FBS > 180 mg/dL, increase Lantus to 22 units QD. If post-prandial blood sugars > 180 mg/dL, increase to Novolog 6 units tidwc.  Spoke with pt late yesterday afternoon regarding her diabetes control. Pt states she's been under a lot of stress, as her job will be ending on 02/10/2018. Worried about insurance coverage. Can't afford COBRA, although actively looking for another job. States she eats when stressed. No exercise, and sits x 12H on night shift. Discussed HgbA1C and goal of 7%. Sees Dr. Virgina Jock at [facility] Has appt on 02/10/2018. "He's been working with me on my diabetes, and my HgbA1C has dropped a little. Has not been taking Invokana d/t side effects. Long discussion regarding healthy diet, importance of smoking cessation, walking EVERY day, and monitoring blood sugars at least 3-4x/day. Instructed to take logbook to OV for MD to adjust meds as needed. Also discussed starting insulin and pt states her PCP has discussed this with her before.  She will likely benefit from being discharged on basal-bolus insulin. Would prefer pen. Ordered Living Well book, insulin pen starter kit and will begin teaching insulin pen administration. Discussed  importance of getting blood sugars in control NOW, and as she makes lifestyle changes, her insulin needs may decrease. Answered questions and discussed above with RN. Needs a lot of support. Is not interested in OP Diabetes Education at this point.  Will f/u today.  Thank you. Lorenda Peck, RD, LDN, CDE Inpatient Diabetes Coordinator [phone]

## 2018-01-24 NOTE — Progress Notes (Signed)
INFECTIOUS DISEASE PROGRESS NOTE  ID: Margaret Carey is a 57 y.o. female with  Principal Problem:   Severe sepsis with septic shock ([place]) Active Problems:   Thyroid disease   Hypertension   Sepsis ([place])   Acute lower UTI   AKI (acute kidney injury) ([place])   Hyponatremia   DM (diabetes mellitus) ([place])   Abscess of back  Subjective: No complaints.  New abscess in L groin spontaneously drained.   Abtx:  Anti-infectives (From admission, onward)   Start     Dose/Rate Route Frequency Ordered Stop   01/22/18 1400  vancomycin (VANCOCIN) 1,250 mg in sodium chloride 0.9 % 250 mL IVPB     1,250 mg 166.7 mL/hr over 90 Minutes Intravenous Every 24 hours 01/22/18 1241     01/22/18 0600  cefTRIAXone (ROCEPHIN) 2 g in sodium chloride 0.9 % 100 mL IVPB  Status:  Discontinued     2 g 200 mL/hr over 30 Minutes Intravenous Every 24 hours 01/21/18 1311 01/23/18 1226   01/21/18 1337  vancomycin variable dose per unstable renal function (pharmacist dosing)  Status:  Discontinued      Does not apply See admin instructions 01/21/18 1337 01/22/18 1246   01/21/18 0715  vancomycin (VANCOCIN) IVPB 1000 mg/200 mL premix     1,000 mg 200 mL/hr over 60 Minutes Intravenous  Once 01/21/18 0600 01/21/18 0838   01/21/18 0600  vancomycin (VANCOCIN) IVPB 1000 mg/200 mL premix     1,000 mg 200 mL/hr over 60 Minutes Intravenous  Once 01/21/18 0600 01/21/18 0705   01/21/18 0545  vancomycin (VANCOCIN) IVPB 1000 mg/200 mL premix  Status:  Discontinued     1,000 mg 200 mL/hr over 60 Minutes Intravenous  Once 01/21/18 0541 01/21/18 0559   01/21/18 0545  cefTRIAXone (ROCEPHIN) 2 g in sodium chloride 0.9 % 100 mL IVPB  Status:  Discontinued     2 g 200 mL/hr over 30 Minutes Intravenous Every 24 hours 01/21/18 0541 01/21/18 1337      Medications:  Scheduled: . aspirin EC  81 mg Oral Daily  . furosemide  20 mg Intravenous Once  . heparin  5,000 Units Subcutaneous Q8H  . insulin aspart  0-15 Units Subcutaneous  TID WC  . insulin aspart  0-5 Units Subcutaneous QHS  . insulin aspart  6 Units Subcutaneous TID WC  . [START ON 01/25/2018] insulin glargine  22 Units Subcutaneous Daily  . insulin starter kit- pen needles  1 kit Other Once  . nicotine  21 mg Transdermal Daily  . phenazopyridine  100 mg Oral BID  . thyroid  180 mg Oral Daily    Objective: Vital signs in last 24 hours: Temp:  [98 F (36.7 C)-98.7 F (37.1 C)] 98.1 F (36.7 C) (10/22 0800) Pulse Rate:  [90] 90 (10/21 1435) Resp:  [22] 22 (10/21 1435) BP: (120-146)/(53-58) 146/53 (10/21 1817) SpO2:  [94 %] 94 % (10/21 1435)   General appearance: alert, cooperative and no distress Incision/Wound: wound on back is dressed. Clean. L groin wound is indurated, draining bloody tinged fluid.   Lab Results Recent Labs    01/23/18 0420 01/24/18 0336  WBC 7.3 6.2  HGB 10.6* 9.9*  HCT 31.8* 30.6*  NA 135 137  K 4.5 3.8  CL 108 107  CO2 21* 22  BUN 16 14  CREATININE 0.80 0.74   Liver Panel Recent Labs    01/22/18 0944  PROT 6.8  ALBUMIN 2.8*  AST 21  ALT 31  ALKPHOS 80  BILITOT 0.9   Sedimentation Rate No results for input(s): ESRSEDRATE in the last 72 hours. C-Reactive Protein No results for input(s): CRP in the last 72 hours.  Microbiology: Recent Results (from the past 240 hour(s))  Blood Culture (routine x 2)     Status: None (Preliminary result)   Collection Time: 01/21/18  5:12 AM  Result Value Ref Range Status   Specimen Description   Final    BLOOD RIGHT ARM Performed at [facility], [address] [place], [place] [address]    Special Requests   Final    BOTTLES DRAWN AEROBIC AND ANAEROBIC Blood Culture adequate volume Performed at [facility], [address] [place], [place] [address]    Culture   Final    NO GROWTH 3 DAYS Performed at [facility], [place] [address], [place], [place] [address]    Report Status PENDING  Incomplete  Blood Culture (routine x 2)      Status: None (Preliminary result)   Collection Time: 01/21/18  5:21 AM  Result Value Ref Range Status   Specimen Description   Final    BLOOD LEFT ARM Performed at [facility], [address] [place], [place] [address]    Special Requests   Final    BOTTLES DRAWN AEROBIC AND ANAEROBIC Blood Culture results may not be optimal due to an excessive volume of blood received in culture bottles Performed at [facility], [address] [place], [place] [address]    Culture   Final    NO GROWTH 3 DAYS Performed at [facility], [place] [address], [place], [place] [address]    Report Status PENDING  Incomplete  Urine culture     Status: Abnormal   Collection Time: 01/21/18  6:27 AM  Result Value Ref Range Status   Specimen Description   Final    URINE, CLEAN CATCH Performed at [facility], [address] [place], [place] [address]    Special Requests   Final    NONE Performed at [facility], [address] [place], [place] [address]    Culture MULTIPLE SPECIES PRESENT, SUGGEST RECOLLECTION (A)  Final   Report Status 01/22/2018 FINAL  Final  MRSA PCR Screening     Status: None   Collection Time: 01/21/18 12:51 PM  Result Value Ref Range Status   MRSA by PCR NEGATIVE NEGATIVE Final    Comment:        The GeneXpert MRSA Assay (FDA approved for NASAL specimens only), is one component of a comprehensive MRSA colonization surveillance program. It is not intended to diagnose MRSA infection nor to guide or monitor treatment for MRSA infections. Performed at [facility], [place], [place] [address], [place], [place] [address]   Aerobic/Anaerobic Culture (surgical/deep wound)     Status: None (Preliminary result)   Collection Time: 01/22/18 12:27 PM  Result Value Ref Range Status   Specimen Description   Final    ABSCESS BACK Performed at [facility], [place] [address], [place], [place] [address]    Special Requests    Final    NONE Performed at [facility], [place] [address] [place], [place] [address]    Gram Stain   Final    MODERATE WBC PRESENT, PREDOMINANTLY PMN FEW GRAM POSITIVE COCCI    Culture   Final    MODERATE  STAPHYLOCOCCUS AUREUS SUSCEPTIBILITIES TO FOLLOW Performed at [facility], [place] [address], [place], [place] [address]    Report Status PENDING  Incomplete    Studies/Results: No results found.   Assessment/Plan: Abscesses DM2 (uncontrolled A1C 8.9%) AKI- resolved.   Total days of antibiotics: 3 vanco  Await sensi of Staph.  She is going to OR for debridement of L groin.  Her AKI resolved.  FSG trending towards improved.          Bobby Rumpf MD, FACP Infectious Diseases (pager) [phone] [email or web address] 01/24/2018, 10:00 AM  LOS: 3 days

## 2018-01-25 DIAGNOSIS — E871 Hypo-osmolality and hyponatremia: Secondary | ICD-10-CM

## 2018-01-25 DIAGNOSIS — E079 Disorder of thyroid, unspecified: Secondary | ICD-10-CM

## 2018-01-25 DIAGNOSIS — E1169 Type 2 diabetes mellitus with other specified complication: Secondary | ICD-10-CM

## 2018-01-25 DIAGNOSIS — R6521 Severe sepsis with septic shock: Secondary | ICD-10-CM

## 2018-01-25 DIAGNOSIS — A419 Sepsis, unspecified organism: Principal | ICD-10-CM

## 2018-01-25 LAB — CBC WITH DIFFERENTIAL/PLATELET
ABS IMMATURE GRANULOCYTES: 0.25 10*3/uL — AB (ref 0.00–0.07)
Basophils Absolute: 0.1 10*3/uL (ref 0.0–0.1)
Basophils Relative: 1 %
EOS PCT: 6 %
Eosinophils Absolute: 0.4 10*3/uL (ref 0.0–0.5)
HEMATOCRIT: 32.6 % — AB (ref 36.0–46.0)
Hemoglobin: 10.8 g/dL — ABNORMAL LOW (ref 12.0–15.0)
Immature Granulocytes: 4 %
LYMPHS ABS: 2.2 10*3/uL (ref 0.7–4.0)
Lymphocytes Relative: 36 %
MCH: 31.5 pg (ref 26.0–34.0)
MCHC: 33.1 g/dL (ref 30.0–36.0)
MCV: 95 fL (ref 80.0–100.0)
MONO ABS: 0.7 10*3/uL (ref 0.1–1.0)
MONOS PCT: 11 %
Neutro Abs: 2.6 10*3/uL (ref 1.7–7.7)
Neutrophils Relative %: 42 %
Platelets: 195 10*3/uL (ref 150–400)
RBC: 3.43 MIL/uL — ABNORMAL LOW (ref 3.87–5.11)
RDW: 12.9 % (ref 11.5–15.5)
WBC: 6.1 10*3/uL (ref 4.0–10.5)
nRBC: 0 % (ref 0.0–0.2)

## 2018-01-25 LAB — BASIC METABOLIC PANEL
Anion gap: 7 (ref 5–15)
BUN: 10 mg/dL (ref 6–20)
CHLORIDE: 104 mmol/L (ref 98–111)
CO2: 27 mmol/L (ref 22–32)
CREATININE: 0.61 mg/dL (ref 0.44–1.00)
Calcium: 8.8 mg/dL — ABNORMAL LOW (ref 8.9–10.3)
GFR calc Af Amer: >60 mL/min (ref >60)
GFR calc non Af Amer: >60 mL/min (ref >60)
GLUCOSE: 173 mg/dL — AB (ref 70–99)
Potassium: 3.6 mmol/L (ref 3.5–5.1)
SODIUM: 138 mmol/L (ref 135–145)

## 2018-01-25 LAB — GLUCOSE, CAPILLARY
Glucose-Capillary: 198 mg/dL — ABNORMAL HIGH (ref 70–99)
Glucose-Capillary: 184 mg/dL — ABNORMAL HIGH (ref 70–99)

## 2018-01-25 LAB — MAGNESIUM: Magnesium: 1.8 mg/dL (ref 1.7–2.4)

## 2018-01-25 MED ORDER — INSULIN GLARGINE 100 UNIT/ML SOLOSTAR PEN: 30.0000 [IU] | PEN_INJECTOR | Freq: Every day | SUBCUTANEOUS | 11 refills | Status: DC

## 2018-01-25 MED ORDER — INSULIN GLARGINE 100 UNIT/ML ~~LOC~~ SOLN: 30.0000 [IU] | Freq: Every day | SUBCUTANEOUS | Status: DC

## 2018-01-25 MED ORDER — HYDROCHLOROTHIAZIDE 12.5 MG PO CAPS
12.5000 mg | ORAL_CAPSULE | Freq: Every day | ORAL | Status: DC
  Administered 2018-01-25: 12.5 mg via ORAL
  Filled 2018-01-25: qty 1

## 2018-01-25 MED ORDER — INSULIN PEN NEEDLE 31G X 6 MM MISC: 1.0000 | Freq: Two times a day (BID) | 3 refills | Status: AC

## 2018-01-25 MED ORDER — LISINOPRIL-HYDROCHLOROTHIAZIDE 10-12.5 MG PO TABS: 1.0000 | ORAL_TABLET | Freq: Two times a day (BID) | ORAL | Status: DC

## 2018-01-25 MED ORDER — BLOOD GLUCOSE MONITOR KIT: PACK | 0 refills | Status: AC

## 2018-01-25 MED ORDER — DOXYCYCLINE HYCLATE 100 MG PO CAPS: 100.0000 mg | ORAL_CAPSULE | Freq: Two times a day (BID) | ORAL | 0 refills | Status: DC

## 2018-01-25 MED ORDER — HYDROCODONE-ACETAMINOPHEN 5-325 MG PO TABS: 1.0000 | ORAL_TABLET | Freq: Four times a day (QID) | ORAL | Status: DC | PRN

## 2018-01-25 MED ORDER — DOXYCYCLINE HYCLATE 100 MG PO TABS
100.0000 mg | ORAL_TABLET | Freq: Two times a day (BID) | ORAL | Status: DC
  Administered 2018-01-25: 100 mg via ORAL
  Filled 2018-01-25: qty 1

## 2018-01-25 MED ORDER — LISINOPRIL 10 MG PO TABS
10.0000 mg | ORAL_TABLET | Freq: Every day | ORAL | Status: DC
  Administered 2018-01-25: 10 mg via ORAL
  Filled 2018-01-25: qty 1

## 2018-01-25 NOTE — Care Management Note (Signed)
Case Management Note  Patient Details  Name: Margaret Carey MRN: [id number] Date of Birth: 10/08/1960  Subjective/Objective:                  Discharged to home with self care  Action/Plan: Discharge to home with self care, orders checked for hhc needs. No CM needs present at time of discharge.  Expected Discharge Date:  01/25/18               Expected Discharge Plan:  Home/Self Care  In-House Referral:     Discharge planning Services  CM Consult  Post Acute Care Choice:    Choice offered to:     DME Arranged:    DME Agency:     HH Arranged:    HH Agency:     Status of Service:  Completed, signed off  If discussed at [employer] of Stay Meetings, dates discussed:    Additional Comments:  Leeroy Cha, RN 01/25/2018, 10:05 AM

## 2018-01-25 NOTE — Discharge Instructions (Signed)
Skin Abscess A skin abscess is an infected area on or under your skin that contains pus and other material. An abscess can happen almost anywhere on your body. Some abscesses break open (rupture) on their own. Most continue to get worse unless they are treated. The infection can spread deeper into the body and into your blood, which can make you feel sick. Treatment usually involves draining the abscess. Follow these instructions at home: Abscess Care  If you have an abscess that has not drained, place a warm, clean, wet washcloth over the abscess several times a day. Do this as told by your doctor.  Follow instructions from your doctor about how to take care of your abscess. Make sure you: ? Cover the abscess with a bandage (dressing). ? Change your bandage or gauze as told by your doctor. ? Wash your hands with soap and water before you change the bandage or gauze. If you cannot use soap and water, use hand sanitizer.  Check your abscess every day for signs that the infection is getting worse. Check for: ? More redness, swelling, or pain. ? More fluid or blood. ? Warmth. ? More pus or a bad smell. Medicines   Take over-the-counter and prescription medicines only as told by your doctor.  If you were prescribed an antibiotic medicine, take it as told by your doctor. Do not stop taking the antibiotic even if you start to feel better. General instructions  To avoid spreading the infection: ? Do not share personal care items, towels, or hot tubs with others. ? Avoid making skin-to-skin contact with other people.  Keep all follow-up visits as told by your doctor. This is important. Contact a doctor if:  You have more redness, swelling, or pain around your abscess.  You have more fluid or blood coming from your abscess.  Your abscess feels warm when you touch it.  You have more pus or a bad smell coming from your abscess.  You have a fever.  Your muscles ache.  You have  chills.  You feel sick. Get help right away if:  You have very bad (severe) pain.  You see red streaks on your skin spreading away from the abscess. This information is not intended to replace advice given to you by your health care provider. Make sure you discuss any questions you have with your health care provider. Document Released: 09/08/2007 Document Revised: 11/16/2015 Document Reviewed: 01/29/2015 Elsevier Interactive Patient Education  2018 [place].  Mechanical Wound Debridement You can shower and let soap and water get into both abscess sites.  After your shower you can put a piece of iodoform and a dry dressing over the groin site.  This will probably closed fairly quickly.  When it becomes difficult to get a piece of iodoform in there you can just put a dry dressing and let it heal up.  For the back site, again shower.  Let soap and water get into the site.  After its clean gently rub the interior of the wound with wet 4 x 4.  When this becomes completely pink on the inside you can stop the rubbing of the interior.  You can then repacked the site with wet-to-dry as instructed in the hospital. What a sterile 4 x 4 with sterile saline.  Open and packed into the site.  This should slowly start getting smaller. Call [facility] Surgery office if you have any problems or questions.  Mechanical wound debridement is a treatment to remove  dead tissue from a wound. This helps the wound heal. The treatment involves cleaning the wound (irrigation) and using a pad or gauze (dressing) to remove dead tissue and debris from the wound. There are different types of mechanical wound debridement. Depending on the wound, you may need to repeat this procedure or change to another form of debridement as your wound starts to heal. Tell a health care provider about:  Any allergies you have.  All medicines you are taking, including vitamins, herbs, eye drops, creams, and over-the-counter  medicines.  Any blood disorders you have.  Any medical conditions you have, including any conditions that: ? Cause a significant decrease in blood circulation to the part of the body where the wound is, such as peripheral vascular disease. ? Compromise your defense (immune) system or white blood count.  Any surgeries you have had.  Whether you are pregnant or may be pregnant. What are the risks? Generally, this is a safe procedure. However, problems may occur, including:  Infection.  Bleeding.  Damage to healthy tissue in and around your wound.  Soreness or pain.  Failure of the wound to heal.  Scarring.  What happens before the procedure? You may be given antibiotic medicine to help prevent infection. What happens during the procedure?  Your health care provider may apply a numbing medicine (topical anesthetic) to the wound.  Your health care provider will irrigate your wound with a germ-free (sterile), salt-water (saline) solution. This removes debris, bacteria, and dead tissue.  Depending on what type of mechanical wound debridement you are having, your health care provider may do one of the following: ? Put a dressing on your wound. You may have dry gauze pad placed into the wound. Your health care provider will remove the gauze after the wound is dry. Any dead tissue and debris that has dried into the gauze will be lifted out of the wound (wet-to-dry debridement). ? Use a type of pad (monofilament fiber debridement pad). This pad has a fluffy surface on one side that picks up dead tissue and debris from your wound. Your health care provider wets the pad and wipes it over your wound for several minutes. ? Irrigate your wound with a pressurized stream of solution such as saline or water.  Once your health care provider is finished, he or she may apply a light dressing to your wound. The procedure may vary among health care providers and hospitals. What happens after the  procedure?  You may receive medicine for pain.  You will continue to receive antibiotic medicine if it was started before your procedure. This information is not intended to replace advice given to you by your health care provider. Make sure you discuss any questions you have with your health care provider. Document Released: 12/11/2014 Document Revised: 08/28/2015 Document Reviewed: 07/31/2014 Elsevier Interactive Patient Education  [employer].

## 2018-01-25 NOTE — Progress Notes (Signed)
Patient given discharge instructions, and verbalized an understanding of all discharge instructions.  Patient agrees with discharge plan, and is being discharged in stable medical condition.  Patient given transportation via wheelchair. 

## 2018-01-25 NOTE — Progress Notes (Signed)
3 Days Post-Op    CC: sepsis with abscess  Subjective: She looks good and they are hoping to send her home today.  Sites are fine and she has instructions on care of them.  I will get her set up to follow up in the office.  Objective: Vital signs in last 24 hours: Temp:  [97.6 F (36.4 C)-98.8 F (37.1 C)] 98.2 F (36.8 C) (10/23 0602) Pulse Rate:  [64] 64 (10/23 0602) Resp:  [18-35] 18 (10/23 0602) BP: (140-148)/(63-130) 140/63 (10/23 0602) SpO2:  [95 %-96 %] 95 % (10/23 0602) Weight:  [123 kg-126.1 kg] 123 kg (10/23 0500) Last BM Date: 01/19/18 Urine listed as 6250? Afebrile, VSS  One BP with diastolic 329?? Glucose improving  WBC 6.1  Intake/Output from previous day: 10/22 0701 - 10/23 0700 In: -  Out: 6250 [Urine:6250] Intake/Output this shift: No intake/output data recorded.  General appearance: alert, cooperative and no distress Skin: Site look good, dressing is out of the groin site with shower.  No cellulitis.  Back is the same.    Lab Results:  Recent Labs    01/24/18 0336 01/25/18 0329  WBC 6.2 6.1  HGB 9.9* 10.8*  HCT 30.6* 32.6*  PLT 158 195    BMET Recent Labs    01/24/18 0336 01/25/18 0329  NA 137 138  K 3.8 3.6  CL 107 104  CO2 22 27  GLUCOSE 180* 173*  BUN 14 10  CREATININE 0.74 0.61  CALCIUM 8.1* 8.8*   PT/INR No results for input(s): LABPROT, INR in the last 72 hours.  Recent Labs  Lab 01/21/18 0521 01/22/18 0944  AST 28 21  ALT 31 31  ALKPHOS 75 80  BILITOT 1.5* 0.9  PROT 7.4 6.8  ALBUMIN 3.1* 2.8*     Lipase  No results found for: LIPASE   Medications: . aspirin EC  81 mg Oral Daily  . heparin  5,000 Units Subcutaneous Q8H  . lisinopril  10 mg Oral Daily   And  . hydrochlorothiazide  12.5 mg Oral Daily  . insulin aspart  0-15 Units Subcutaneous TID WC  . insulin aspart  0-5 Units Subcutaneous QHS  . insulin aspart  6 Units Subcutaneous TID WC  . [START ON 01/26/2018] insulin glargine  30 Units Subcutaneous  Daily  . nicotine  21 mg Transdermal Daily  . thyroid  180 mg Oral Daily    Assessment/Plan Severe sepsis with septic shock UTI Hypothyroid Hypertension Hyponatremia Type 2 diabetes-A1c 8.9  Deep mid back subcutaneous abscess Incision and drainage, 01/22/2018 Dr. Excell Seltzer POD #2 Left groin abscess -  I&D bedside 01/24/18 Izora Gala PAC  FEN: IV fluids/carb mod diet ID: Rocephin 10/19-10/21/vancomycin 10/19=>> day 3 DVT: SCDs/heparin Follow-up: TBD    Plan:  Home today I have follow up for her in the office and have dressing instructions in the AVS.   Call if we can help  LOS: 4 days    Margaret Carey 01/25/2018 [phone]

## 2018-01-25 NOTE — Discharge Summary (Signed)
Physician Discharge Summary  Margaret Carey QXI:[id number] DOB: 1960/04/19 DOA: 01/21/2018  PCP: Shon Baton, MD  Admit date: 01/21/2018 Discharge date: 01/25/2018  Admitted From: home Disposition:  Home  Recommendations for Outpatient Follow-up:  1. Follow up with Surgery in 1-2 weeks for wound 2. Follow-up with primary care doctor in 2 weeks titrate insulin as needed. 3. Please obtain BMP/CBC in one week   Home Health:No Equipment/Devices:none  Discharge Condition:stable CODE STATUS:full Diet recommendation: Heart Healthy   Brief/Interim Summary: You may copy/paste interim summary or write brief hospital course depending on length of stay  Discharge Diagnoses:  Principal Problem:   Severe sepsis with septic shock ([place]) Active Problems:   Thyroid disease   Hypertension   Sepsis (Margaret Carey)   AKI (acute kidney injury) ([place])   Hyponatremia   DM (diabetes mellitus) ([place])   Abscess of back  Severe sepsis with septic shock ([place]) due to subcutaneous abscess on mid back and left groin abscess: Initially hypotensive on admission with a creatinine of 2 lactic acid of 2.4. He was started empirically on fluid resuscitation and IV antibiotics. Ultrasound of the soft tissue of his back was negative for an abscess.  On physical examination there is significant dilatation in the lower back. Surgery was consulted, recommended I&D done on 01/22/2018, it was noted that the cavity had purulent serial and early necrosis of infectious disease was consulted who recommended to continue IV vancomycin. Abscess culture shows MSSA.  She was changed to oral doxy which she will continue for 12 additional days an outpatient and will follow up with surgery as an outpatient.  Thyroid disease: Continue Synthroid.  Acute kidney injury: Likely prerenal likely due to sepsis in the setting of ACE inhibitor and diuretics these were held on admission. Resolved with IV fluid hydration creatinine now at  baseline.  Hyponatremia: Hypovolemic resolved with IV fluid hydration.    Essential hypertension Antihypertensive medications were held on admission due to low blood pressure. Started empirically on Norvasc, his blood pressure trended high we will restart ACE inhibitor and diuretic therapy.  Uncontrolled diabetes mellitus type 2: With an A1c of 8.9, she was started on Lantus plus sliding scale she will continue home on Lantus 30 units daily plus metformin plus Trulicity. Will up with PCP in 2 to 4 weeks titrate insulins and medications as needed.  Goal of hemoglobin A1c is less than 6.5.  Discharge Instructions  Discharge Instructions    Diet - low sodium heart healthy   Complete by:  As directed    Increase activity slowly   Complete by:  As directed      Allergies as of 01/25/2018      Reactions   Sulfa Antibiotics Rash      Medication List    STOP taking these medications   cephALEXin 500 MG capsule Commonly known as:  KEFLEX   glimepiride 4 MG tablet Commonly known as:  AMARYL     TAKE these medications   ARMOUR THYROID 180 MG tablet Generic drug:  thyroid Take 180 mg by mouth daily. 149m po once daily   aspirin 81 MG tablet Take 81 mg by mouth daily.   blood glucose meter kit and supplies Kit Dispense based on patient and insurance preference. Use up to four times daily as directed. (FOR ICD-9 250.00, 250.01).   canagliflozin 100 MG Tabs tablet Commonly known as:  INVOKANA Take 100 mg by mouth.   doxycycline 100 MG capsule Commonly known as:  VIBRAMYCIN Take 1 capsule (  100 mg total) by mouth 2 (two) times daily for 12 days.   HYDROcodone-acetaminophen 5-325 MG tablet Commonly known as:  NORCO/VICODIN Take 1-2 tablets by mouth every 6 (six) hours as needed (for pain; may cause constipation).   ibuprofen 600 MG tablet Commonly known as:  ADVIL,MOTRIN Take 1 tablet (600 mg total) by mouth every 6 (six) hours as needed.   Insulin Glargine 100 UNIT/ML  Solostar Pen Commonly known as:  LANTUS Inject 30 Units into the skin daily.   Insulin Pen Needle 31G X 6 MM Misc 1 Device by Does not apply route 2 (two) times daily.   lisinopril-hydrochlorothiazide 10-12.5 MG tablet Commonly known as:  PRINZIDE,ZESTORETIC Take 1 tablet by mouth 2 (two) times daily.   omeprazole 20 MG capsule Commonly known as:  PRILOSEC Take 20 mg by mouth 2 (two) times a week.   sitaGLIPtin-metformin 50-1000 MG tablet Commonly known as:  JANUMET Take 1 tablet by mouth 2 (two) times daily with a meal.   TRULICITY 0.86 VH/8.4ON Sopn Generic drug:  Dulaglutide Inject 0.75 mg into the skin once a week.      Follow-up Information    Sur[facility], [place] Follow up on 02/09/2018.   Specialty:  General Surgery Why:  Your appointment is at 1:45 PM.  Be at the office 30 minutes early for check-in.  Bring photo ID and insurance information. Contact information: [address] [place] [address] [phone]          Allergies  Allergen Reactions  . Sulfa Antibiotics Rash    Consultations:  ID  Surgery, General   Procedures/Studies: Dg Chest 2 View  Result Date: 01/21/2018 CLINICAL DATA:  57 y/o  F; abscess on the back for 2 days. Fever. EXAM: CHEST - 2 VIEW COMPARISON:  12/10/2016 CT chest. FINDINGS: Normal cardiac silhouette. Aortic atherosclerosis with calcification. Clear lungs. No pleural effusion or pneumothorax. No acute osseous abnormality is evident. IMPRESSION: No acute pulmonary process identified. Electronically Signed   By: Kristine Garbe M.D.   On: 01/21/2018 05:46   [place] Chest Soft Tissue  Result Date: 01/21/2018 CLINICAL DATA:  Concern for soft tissue abscess midline back region EXAM: ULTRASOUND OF SOFT TISSUES TECHNIQUE: Ultrasound examination of the soft tissues was performed in the area of clinical concern. COMPARISON:  None. FINDINGS: Limited superficial soft tissue ultrasound performed of the posterior  thoracic region soft tissues in the area of concern. No significant superficial soft tissue fluid collection, cyst, mass, hematoma, or definite abscess. IMPRESSION: No significant abnormality by soft tissue ultrasound in the thoracic region area of concern. Electronically Signed   By: Jerilynn Mages.  Shick M.D.   On: 01/21/2018 14:10     Subjective: Feels great no complaints wants to go home.  Discharge Exam: Vitals:   01/25/18 0326 01/25/18 0602  BP:  140/63  Pulse:  64  Resp:  18  Temp: 97.8 F (36.6 C) 98.2 F (36.8 C)  SpO2:  95%   Vitals:   01/25/18 0000 01/25/18 0326 01/25/18 0500 01/25/18 0602  BP:    140/63  Pulse:    64  Resp:    18  Temp: 97.6 F (36.4 C) 97.8 F (36.6 C)  98.2 F (36.8 C)  TempSrc: Oral Oral  Oral  SpO2:    95%  Weight:   123 kg   Height:        General: Pt is alert, awake, not in acute distress Cardiovascular: RRR, S1/S2 +, no rubs, no gallops Respiratory:  CTA bilaterally, no wheezing, no rhonchi Abdominal: Soft, NT, ND, bowel sounds + Extremities: no edema, no cyanosis    The results of significant diagnostics from this hospitalization (including imaging, microbiology, ancillary and laboratory) are listed below for reference.     Microbiology: Recent Results (from the past 240 hour(s))  Blood Culture (routine x 2)     Status: None (Preliminary result)   Collection Time: 01/21/18  5:12 AM  Result Value Ref Range Status   Specimen Description   Final    BLOOD RIGHT ARM Performed at [facility], [address] [place], [place] [address]    Special Requests   Final    BOTTLES DRAWN AEROBIC AND ANAEROBIC Blood Culture adequate volume Performed at [facility], [address] [place], [place] [address]    Culture   Final    NO GROWTH 4 DAYS Performed at [facility], [place] [address] [place], [place] [address]    Report Status PENDING  Incomplete  Blood Culture (routine x 2)     Status: None (Preliminary result)    Collection Time: 01/21/18  5:21 AM  Result Value Ref Range Status   Specimen Description   Final    BLOOD LEFT ARM Performed at [facility], [address] [place], [place] [address]    Special Requests   Final    BOTTLES DRAWN AEROBIC AND ANAEROBIC Blood Culture results may not be optimal due to an excessive volume of blood received in culture bottles Performed at [facility], [address] [place], [place] [address]    Culture   Final    NO GROWTH 4 DAYS Performed at [facility], [place] [address], [place], [place] [address]    Report Status PENDING  Incomplete  Urine culture     Status: Abnormal   Collection Time: 01/21/18  6:27 AM  Result Value Ref Range Status   Specimen Description   Final    URINE, CLEAN CATCH Performed at [facility], [address] [place], [place] [address]    Special Requests   Final    NONE Performed at [facility], [address] [place], [place] [address]    Culture MULTIPLE SPECIES PRESENT, SUGGEST RECOLLECTION (A)  Final   Report Status 01/22/2018 FINAL  Final  MRSA PCR Screening     Status: None   Collection Time: 01/21/18 12:51 PM  Result Value Ref Range Status   MRSA by PCR NEGATIVE NEGATIVE Final    Comment:        The GeneXpert MRSA Assay (FDA approved for NASAL specimens only), is one component of a comprehensive MRSA colonization surveillance program. It is not intended to diagnose MRSA infection nor to guide or monitor treatment for MRSA infections. Performed at [facility], [place] [address], [place], [place] [address]   Aerobic/Anaerobic Culture (surgical/deep wound)     Status: None (Preliminary result)   Collection Time: 01/22/18 12:27 PM  Result Value Ref Range Status   Specimen Description   Final    ABSCESS BACK Performed at [facility], [place] [address], [place], [place] [address]    Special Requests   Final    NONE Performed at [facility], [place] [address] [place], [place] [address]    Gram Stain   Final    MODERATE WBC PRESENT, PREDOMINANTLY PMN FEW GRAM POSITIVE  COCCI Performed at [facility], [place] [address] [place], [place] [address]    Culture   Final    MODERATE STAPHYLOCOCCUS AUREUS NO ANAEROBES ISOLATED; CULTURE IN PROGRESS FOR 5 DAYS    Report Status PENDING  Incomplete   Organism ID, Bacteria STAPHYLOCOCCUS AUREUS  Final      Susceptibility   Staphylococcus aureus - MIC*    CIPROFLOXACIN <=0.5 SENSITIVE Sensitive     ERYTHROMYCIN >=8 RESISTANT Resistant     GENTAMICIN <=0.5 SENSITIVE Sensitive     OXACILLIN 0.5 SENSITIVE Sensitive     TETRACYCLINE <=1 SENSITIVE Sensitive     VANCOMYCIN <=0.5 SENSITIVE Sensitive     TRIMETH/SULFA <=10 SENSITIVE Sensitive     CLINDAMYCIN <=0.25 SENSITIVE Sensitive     RIFAMPIN <=0.5 SENSITIVE Sensitive     Inducible Clindamycin NEGATIVE Sensitive     * MODERATE STAPHYLOCOCCUS AUREUS  Aerobic/Anaerobic Culture (surgical/deep wound)     Status: None (Preliminary result)   Collection Time: 01/24/18  3:17 PM  Result Value Ref Range Status   Specimen Description   Final    ABSCESS GROIN LEFT Performed at [place] [address], [place], [place] [address]    Special Requests   Final    NONE Performed at [facility], [place] [address] [place], [place] [address]    Gram Stain NO WBC SEEN RARE GRAM NEGATIVE RODS   Final   Culture   Final    NO GROWTH < 24 HOURS Performed at [facility], [place] [address] [place], [place] [address]    Report Status PENDING  Incomplete     Labs: BNP (last 3 results) No results for input(s): BNP in the last 8760 hours. Basic Metabolic Panel: Recent Labs  Lab 01/21/18 0521 01/22/18 0944 01/23/18 0420 01/24/18 0336 01/25/18 0329  NA 127* 136 135 137 138  K 3.7 3.9 4.5 3.8 3.6  CL 93* 105 108 107 104  CO2 20* 22 21* 22 27  GLUCOSE 348* 249* 259* 180*  173*  BUN 40* 21* _0 CREATININE 2.14* 0.87 0.80 0.74 0.61  CALCIUM 9.0 8.6* 8.0* 8.1* 8.8*  MG  --  2.0 1.9 1.6* 1.8   Liver Function Tests: Recent Labs  Lab 01/21/18 0521 01/22/18 0944  AST 28 21  ALT 31 31  ALKPHOS 75 80  BILITOT 1.5* 0.9  PROT 7.4 6.8  ALBUMIN 3.1* 2.8*   No results for input(s): LIPASE, AMYLASE in the last 168 hours. No results for input(s): AMMONIA in the last 168 hours. CBC: Recent Labs  Lab 01/21/18 0521 01/22/18 0944 01/23/18 0420 01/24/18 0336 01/25/18 0329  WBC 13.4* 9.1 7.3 6.2 6.1  NEUTROABS 11.1*  --  5.8 3.1 2.6  HGB 13.0 11.0* 10.6* 9.9* 10.8*  HCT 37.5 32.6* 31.8* 30.6* 32.6*  MCV 92.6 94.2 96.1 97.1 95.0  PLT 158 127* 144* 158 195   Cardiac Enzymes: No results for input(s): CKTOTAL, CKMB, CKMBINDEX, TROPONINI in the last 168 hours. BNP: Invalid input(s): POCBNP CBG: Recent Labs  Lab 01/24/18 0754 01/24/18 1243 01/24/18 1608 01/24/18 2141 01/25/18 0732  GLUCAP 162* 158* 192* 207* 198*   D-Dimer No results for input(s): DDIMER in the last 72 hours. Hgb A1c No results for input(s): HGBA1C in the last 72 hours. Lipid Profile No results for input(s): CHOL, HDL, LDLCALC, TRIG, CHOLHDL, LDLDIRECT in the last 72 hours. Thyroid function studies No results for input(s): TSH, T4TOTAL, T3FREE, THYROIDAB in the last  72 hours.  Invalid input(s): FREET3 Anemia work up No results for input(s): VITAMINB12, FOLATE, FERRITIN, TIBC, IRON, RETICCTPCT in the last 72 hours. Urinalysis    Component Value Date/Time   COLORURINE ORANGE (A) 01/21/2018 0627   APPEARANCEUR CLOUDY (A) 01/21/2018 0627   LABSPEC >1.030 (H) 01/21/2018 0627   PHURINE 5.0 01/21/2018 0627   GLUCOSEU 100 (A) 01/21/2018 0627   HGBUR MODERATE (A) 01/21/2018 0627   BILIRUBINUR MODERATE (A) 01/21/2018 0627   KETONESUR NEGATIVE 01/21/2018 0627   PROTEINUR 30 (A) 01/21/2018 0627   UROBILINOGEN 0.2 03/06/2011 1417   NITRITE NEGATIVE 01/21/2018 0627    LEUKOCYTESUR MODERATE (A) 01/21/2018 0627   Sepsis Labs Invalid input(s): PROCALCITONIN,  WBC,  LACTICIDVEN Microbiology Recent Results (from the past 240 hour(s))  Blood Culture (routine x 2)     Status: None (Preliminary result)   Collection Time: 01/21/18  5:12 AM  Result Value Ref Range Status   Specimen Description   Final    BLOOD RIGHT ARM Performed at [facility], [address] [place], [place] [address]    Special Requests   Final    BOTTLES DRAWN AEROBIC AND ANAEROBIC Blood Culture adequate volume Performed at [facility], [address] [place], [place] [address]    Culture   Final    NO GROWTH 4 DAYS Performed at [facility], [place] [address] [place], [place] [address]    Report Status PENDING  Incomplete  Blood Culture (routine x 2)     Status: None (Preliminary result)   Collection Time: 01/21/18  5:21 AM  Result Value Ref Range Status   Specimen Description   Final    BLOOD LEFT ARM Performed at [facility], [address] [place], [place] [address]    Special Requests   Final    BOTTLES DRAWN AEROBIC AND ANAEROBIC Blood Culture results may not be optimal due to an excessive volume of blood received in culture bottles Performed at [facility], [address] [place], [place] [address]    Culture   Final    NO GROWTH 4 DAYS Performed at [facility], [place] [address] [place], [place] [address]    Report Status PENDING  Incomplete  Urine culture     Status: Abnormal   Collection Time: 01/21/18  6:27 AM  Result Value Ref Range Status   Specimen Description   Final    URINE, CLEAN CATCH Performed at [facility], [address] [place], [place] [address]    Special Requests   Final    NONE Performed at [facility], [address] [place], [place] [address]    Culture MULTIPLE SPECIES PRESENT, SUGGEST RECOLLECTION (A)  Final   Report Status 01/22/2018 FINAL  Final   MRSA PCR Screening     Status: None   Collection Time: 01/21/18 12:51 PM  Result Value Ref Range Status   MRSA by PCR NEGATIVE NEGATIVE Final    Comment:        The GeneXpert MRSA Assay (FDA approved for NASAL specimens only), is one component of a comprehensive MRSA colonization surveillance program. It is not intended to diagnose MRSA infection nor to guide or monitor treatment for MRSA infections. Performed at [facility], [place] [address] [place], [place] [address]   Aerobic/Anaerobic Culture (surgical/deep wound)     Status: None (Preliminary result)   Collection Time:  01/22/18 12:27 PM  Result Value Ref Range Status   Specimen Description   Final    ABSCESS BACK Performed at [facility], [place] [address] [place], [place] [address]    Special Requests   Final    NONE Performed at [facility], [place] [address], [place], [place] [address]    Gram Stain   Final    MODERATE WBC PRESENT, PREDOMINANTLY PMN FEW GRAM POSITIVE COCCI Performed at [facility], [place] [address], [place], [place] [address]    Culture   Final    MODERATE STAPHYLOCOCCUS AUREUS NO ANAEROBES ISOLATED; CULTURE IN PROGRESS FOR 5 DAYS    Report Status PENDING  Incomplete   Organism ID, Bacteria STAPHYLOCOCCUS AUREUS  Final      Susceptibility   Staphylococcus aureus - MIC*    CIPROFLOXACIN <=0.5 SENSITIVE Sensitive     ERYTHROMYCIN >=8 RESISTANT Resistant     GENTAMICIN <=0.5 SENSITIVE Sensitive     OXACILLIN 0.5 SENSITIVE Sensitive     TETRACYCLINE <=1 SENSITIVE Sensitive     VANCOMYCIN <=0.5 SENSITIVE Sensitive     TRIMETH/SULFA <=10 SENSITIVE Sensitive     CLINDAMYCIN <=0.25 SENSITIVE Sensitive     RIFAMPIN <=0.5 SENSITIVE Sensitive     Inducible Clindamycin NEGATIVE Sensitive     * MODERATE STAPHYLOCOCCUS AUREUS  Aerobic/Anaerobic Culture (surgical/deep wound)     Status: None (Preliminary result)   Collection Time: 01/24/18  3:17 PM  Result  Value Ref Range Status   Specimen Description   Final    ABSCESS GROIN LEFT Performed at [place] [address], [place], [place] [address]    Special Requests   Final    NONE Performed at [facility], [place] [address], [place], [place] [address]    Gram Stain NO WBC SEEN RARE GRAM NEGATIVE RODS   Final   Culture   Final    NO GROWTH < 24 HOURS Performed at [facility], [place] [address] [place],  [address]    Report Status PENDING  Incomplete     Time coordinating discharge: 40 minutes  SIGNED:   Charlynne Cousins, MD  [facility] 01/25/2018, 9:40 AM Pager   If 7PM-7AM, please contact night-coverage [email or web address] Password [email or web address]

## 2018-01-26 LAB — CULTURE, BLOOD (ROUTINE X 2)
CULTURE: NO GROWTH
CULTURE: NO GROWTH
Special Requests: ADEQUATE

## 2018-01-27 DIAGNOSIS — I10 Essential (primary) hypertension: Secondary | ICD-10-CM | POA: Diagnosis not present

## 2018-01-27 DIAGNOSIS — E871 Hypo-osmolality and hyponatremia: Secondary | ICD-10-CM | POA: Diagnosis not present

## 2018-01-27 DIAGNOSIS — J019 Acute sinusitis, unspecified: Secondary | ICD-10-CM | POA: Diagnosis not present

## 2018-01-27 LAB — AEROBIC/ANAEROBIC CULTURE (SURGICAL/DEEP WOUND)

## 2018-01-27 LAB — AEROBIC/ANAEROBIC CULTURE W GRAM STAIN (SURGICAL/DEEP WOUND)

## 2018-01-29 LAB — AEROBIC/ANAEROBIC CULTURE (SURGICAL/DEEP WOUND)

## 2018-01-29 LAB — AEROBIC/ANAEROBIC CULTURE W GRAM STAIN (SURGICAL/DEEP WOUND): Gram Stain: NONE SEEN

## 2018-01-30 ENCOUNTER — Telehealth: Payer: Self-pay | Admitting: Infectious Diseases

## 2018-01-30 DIAGNOSIS — A419 Sepsis, unspecified organism: Secondary | ICD-10-CM

## 2018-01-30 DIAGNOSIS — R6521 Severe sepsis with septic shock: Secondary | ICD-10-CM

## 2018-01-30 DIAGNOSIS — L02212 Cutaneous abscess of back [any part, except buttock]: Secondary | ICD-10-CM

## 2018-01-30 MED ORDER — AMOXICILLIN-POT CLAVULANATE 875-125 MG PO TABS: 1.0000 | ORAL_TABLET | Freq: Two times a day (BID) | ORAL | 0 refills | Status: DC

## 2018-01-30 NOTE — Telephone Encounter (Signed)
Called pt about her Cx which grew MSSA and Prevotella disiens Will change her to augmentin for 10 days.  Left VM Asked her to call me back through hospital operator.

## 2018-02-10 DIAGNOSIS — Z09 Encounter for follow-up examination after completed treatment for conditions other than malignant neoplasm: Secondary | ICD-10-CM | POA: Diagnosis not present

## 2018-02-13 DIAGNOSIS — I10 Essential (primary) hypertension: Secondary | ICD-10-CM | POA: Diagnosis not present

## 2018-02-13 DIAGNOSIS — E038 Other specified hypothyroidism: Secondary | ICD-10-CM | POA: Diagnosis not present

## 2018-02-13 DIAGNOSIS — E119 Type 2 diabetes mellitus without complications: Secondary | ICD-10-CM | POA: Diagnosis not present

## 2018-11-19 IMAGING — MG DIGITAL SCREENING BILATERAL MAMMOGRAM WITH CAD
8 series · 8 of 8 positions shown · non-contrast
Comparison: Previous exam(s).

CLINICAL DATA: Screening.

EXAM:
DIGITAL SCREENING BILATERAL MAMMOGRAM WITH CAD

[L MLO (1 of 2)]
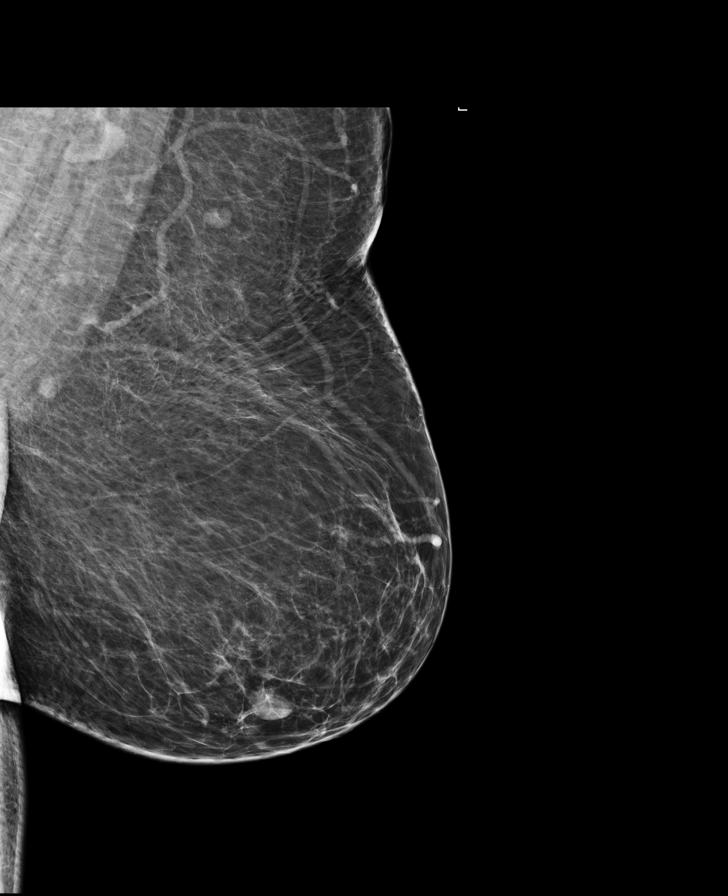

[L CC (1 of 2)]
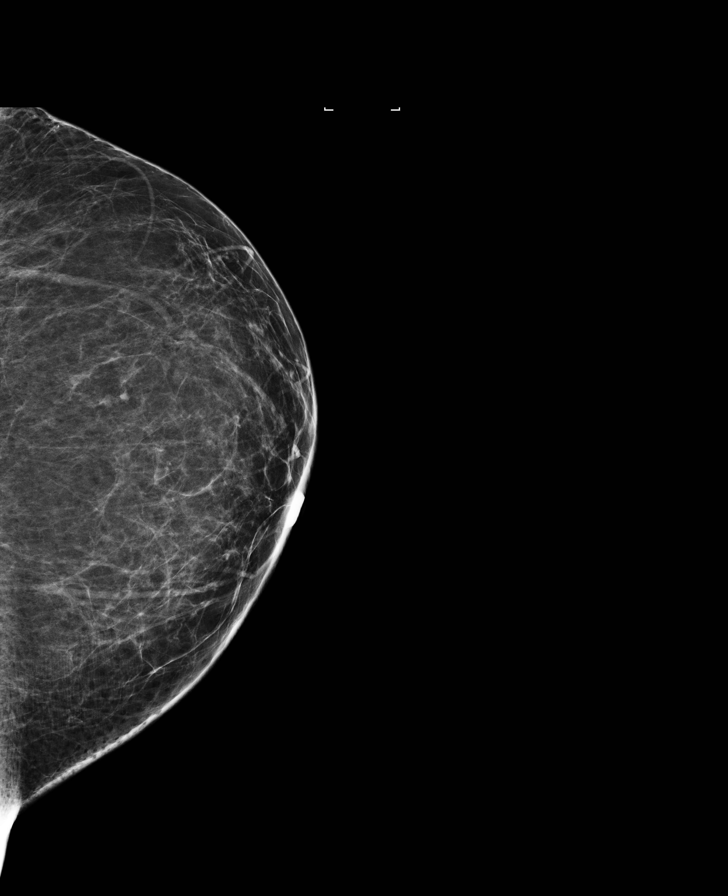

[R CC (1 of 2)]
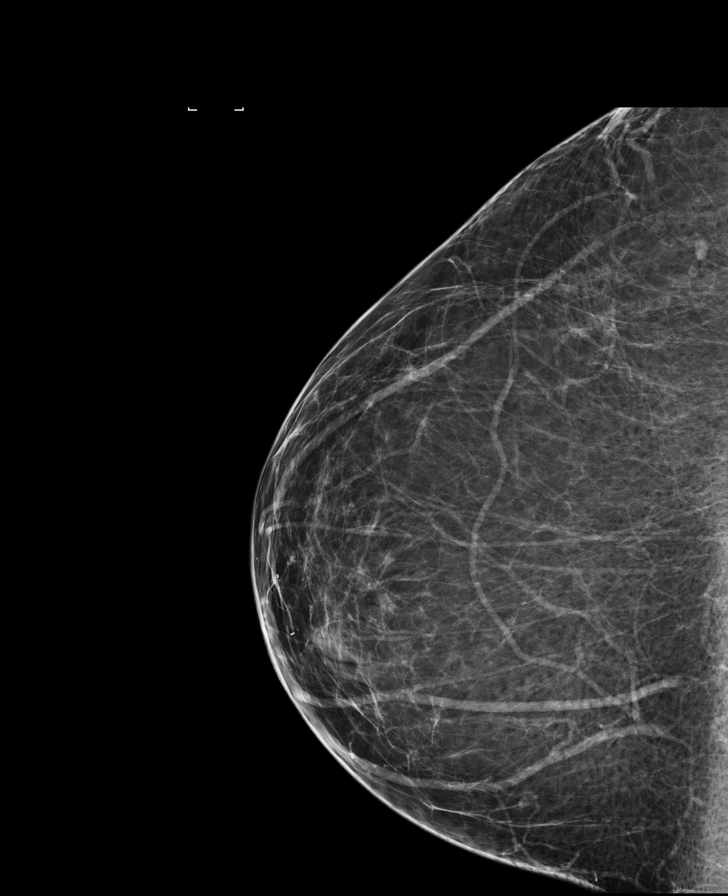

[L MLO (2 of 2)]
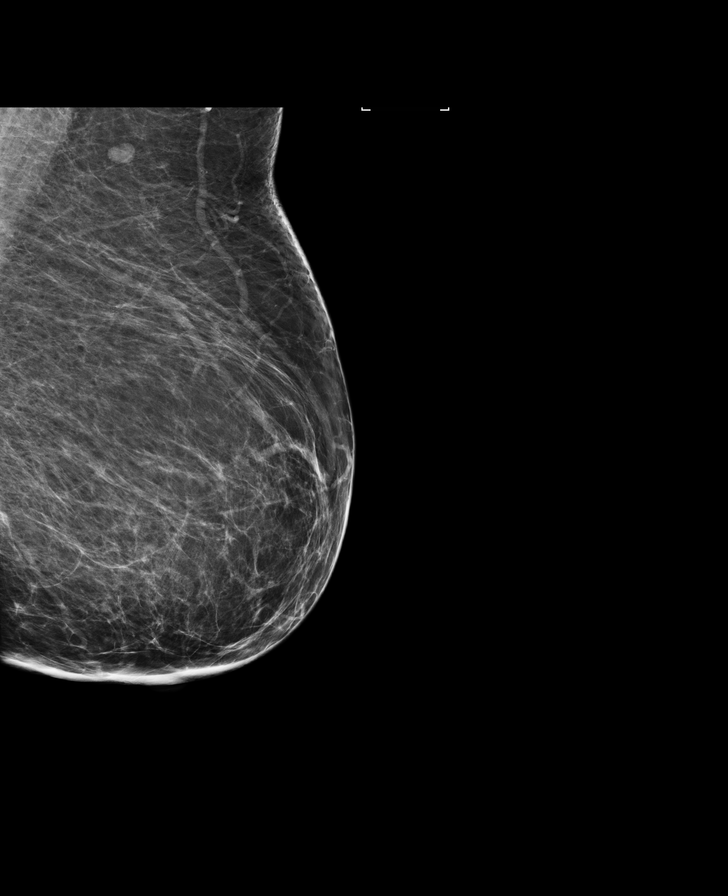

[R MLO]
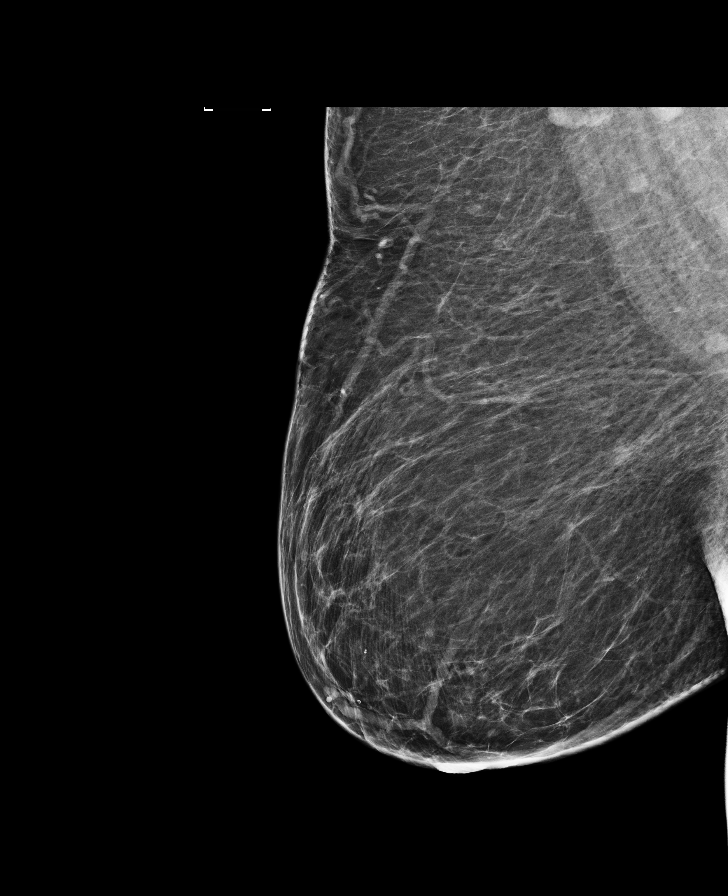

[L CC (2 of 2)]
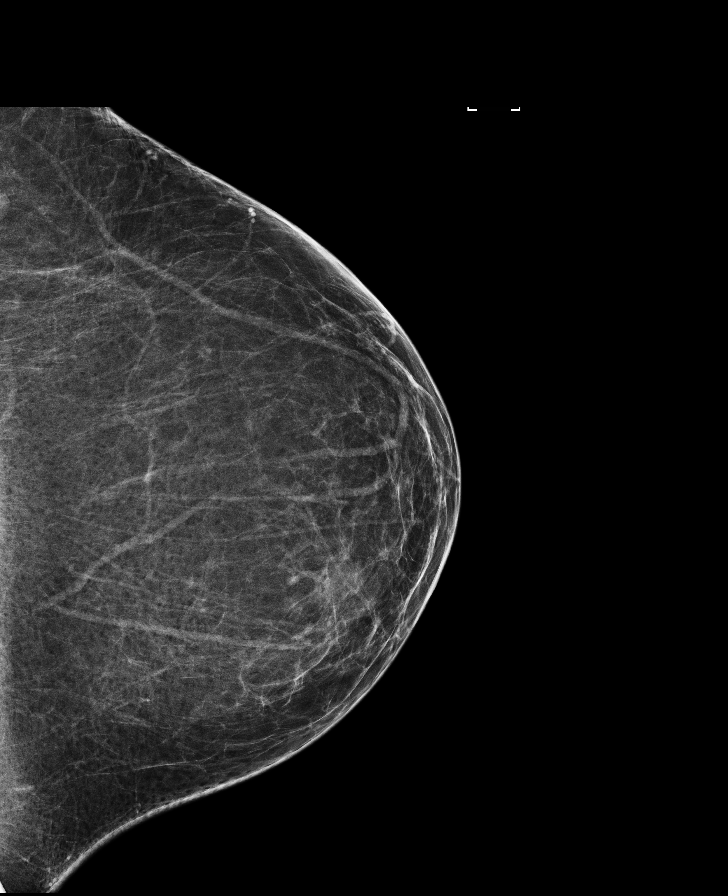

[R CV]
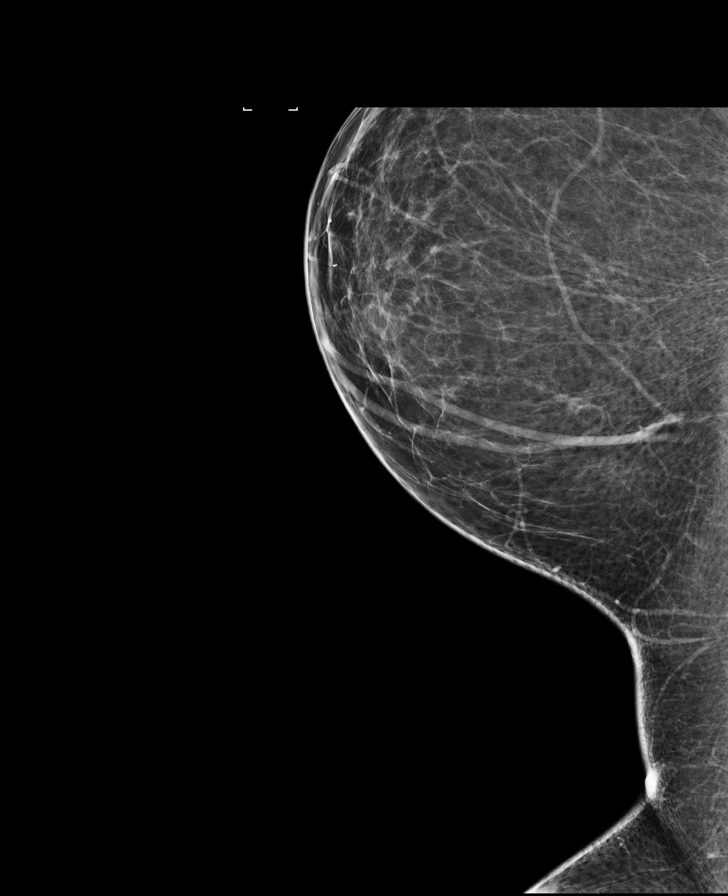

[R CC (2 of 2)]
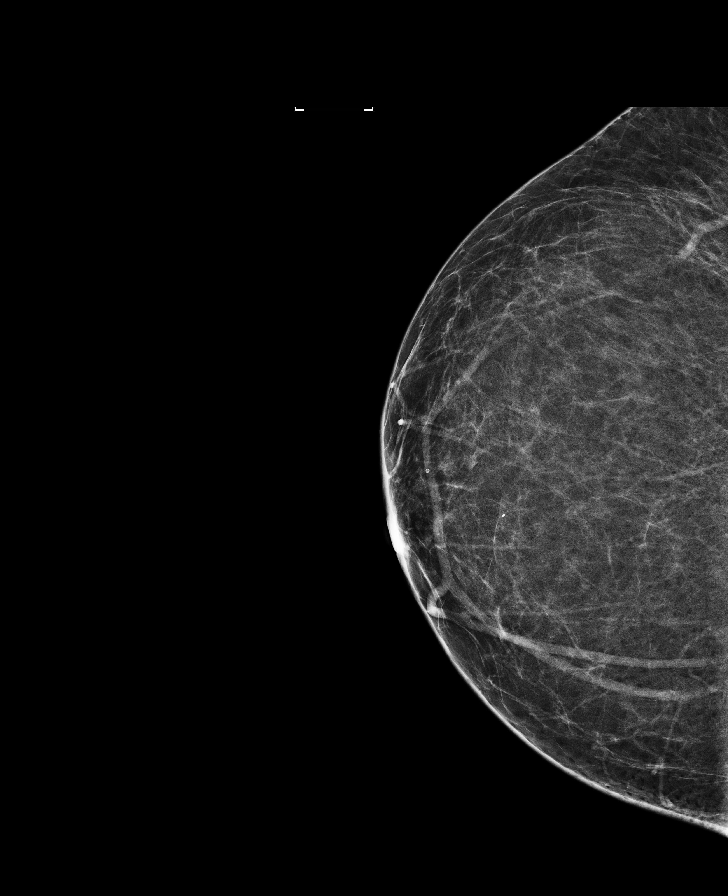

[8 of 8 positions shown; findings below may reference images not displayed]

ACR Breast Density Category b: There are scattered areas of
fibroglandular density.
FINDINGS: There are no findings suspicious for malignancy. Images were
processed with CAD.
IMPRESSION: No mammographic evidence of malignancy. A result letter of this
screening mammogram will be mailed directly to the patient.

RECOMMENDATION:
Screening mammogram in one year. (Code:AS-G-LCT)

BI-RADS CATEGORY  1: Negative.

## 2019-01-10 DIAGNOSIS — E039 Hypothyroidism, unspecified: Secondary | ICD-10-CM | POA: Diagnosis not present

## 2019-01-10 DIAGNOSIS — D649 Anemia, unspecified: Secondary | ICD-10-CM | POA: Diagnosis not present

## 2019-01-10 DIAGNOSIS — Z0000 Encounter for general adult medical examination without abnormal findings: Secondary | ICD-10-CM | POA: Diagnosis not present

## 2019-01-10 DIAGNOSIS — E119 Type 2 diabetes mellitus without complications: Secondary | ICD-10-CM | POA: Diagnosis not present

## 2019-01-16 DIAGNOSIS — E1165 Type 2 diabetes mellitus with hyperglycemia: Secondary | ICD-10-CM | POA: Diagnosis not present

## 2019-01-16 DIAGNOSIS — D649 Anemia, unspecified: Secondary | ICD-10-CM | POA: Diagnosis not present

## 2019-01-16 DIAGNOSIS — K760 Fatty (change of) liver, not elsewhere classified: Secondary | ICD-10-CM | POA: Diagnosis not present

## 2019-01-16 DIAGNOSIS — Z0000 Encounter for general adult medical examination without abnormal findings: Secondary | ICD-10-CM | POA: Diagnosis not present

## 2019-01-16 DIAGNOSIS — F17200 Nicotine dependence, unspecified, uncomplicated: Secondary | ICD-10-CM | POA: Diagnosis not present

## 2019-01-25 DIAGNOSIS — Z23 Encounter for immunization: Secondary | ICD-10-CM | POA: Diagnosis not present

## 2019-01-26 DIAGNOSIS — Z1231 Encounter for screening mammogram for malignant neoplasm of breast: Secondary | ICD-10-CM | POA: Diagnosis not present

## 2019-01-29 DIAGNOSIS — E119 Type 2 diabetes mellitus without complications: Secondary | ICD-10-CM | POA: Diagnosis not present

## 2019-01-29 DIAGNOSIS — H25013 Cortical age-related cataract, bilateral: Secondary | ICD-10-CM | POA: Diagnosis not present

## 2019-01-29 DIAGNOSIS — H2513 Age-related nuclear cataract, bilateral: Secondary | ICD-10-CM | POA: Diagnosis not present

## 2019-01-29 DIAGNOSIS — H40052 Ocular hypertension, left eye: Secondary | ICD-10-CM | POA: Diagnosis not present

## 2020-04-15 ENCOUNTER — Encounter: Payer: Self-pay | Admitting: Gastroenterology

## 2020-05-30 ENCOUNTER — Ambulatory Visit (AMBULATORY_SURGERY_CENTER): Payer: Self-pay

## 2020-05-30 ENCOUNTER — Other Ambulatory Visit: Payer: Self-pay

## 2020-05-30 VITALS — Ht 63.5 in | Wt 238.0 lb

## 2020-05-30 DIAGNOSIS — Z86010 Personal history of colonic polyps: Secondary | ICD-10-CM

## 2020-05-30 MED ORDER — PLENVU 140 G PO SOLR: 1.0000 | ORAL | 0 refills | Status: DC

## 2020-05-30 NOTE — Progress Notes (Signed)
No egg or soy allergy known to patient  No issues with past sedation with any surgeries or procedures No intubation problems in the past  No FH of Malignant Hyperthermia No diet pills per patient No home 02 use per patient  No blood thinners per patient  Pt denies issues with constipation  No A fib or A flutter  COVID 19 guidelines implemented in PV today with Pt and RN    Plenvu Coupon given to pt in PV today , Code to Pharmacy and  NO PA's for preps discussed with pt In PV today  Discussed with pt there will be an out-of-pocket cost for prep and that varies from $0 to 70 dollars   Due to the COVID-19 pandemic we are asking patients to follow certain guidelines.  Pt aware of COVID protocols and [facility] guidelines '

## 2020-06-09 ENCOUNTER — Encounter: Payer: Self-pay | Admitting: Gastroenterology

## 2020-06-23 ENCOUNTER — Ambulatory Visit (AMBULATORY_SURGERY_CENTER): Payer: 59 | Admitting: Gastroenterology

## 2020-06-23 ENCOUNTER — Encounter: Payer: Self-pay | Admitting: Gastroenterology

## 2020-06-23 ENCOUNTER — Other Ambulatory Visit: Payer: Self-pay

## 2020-06-23 VITALS — BP 135/66 | HR 96 | Temp 97.5°F | Resp 16 | Ht 63.5 in | Wt 238.0 lb

## 2020-06-23 DIAGNOSIS — D123 Benign neoplasm of transverse colon: Secondary | ICD-10-CM | POA: Diagnosis not present

## 2020-06-23 DIAGNOSIS — D125 Benign neoplasm of sigmoid colon: Secondary | ICD-10-CM

## 2020-06-23 DIAGNOSIS — Z86010 Personal history of colonic polyps: Secondary | ICD-10-CM | POA: Diagnosis not present

## 2020-06-23 DIAGNOSIS — K635 Polyp of colon: Secondary | ICD-10-CM | POA: Diagnosis not present

## 2020-06-23 MED ORDER — SODIUM CHLORIDE 0.9 % IV SOLN: 500.0000 mL | Freq: Once | INTRAVENOUS | Status: DC

## 2020-06-23 NOTE — Progress Notes (Signed)
Report to PACU, RN, vss, BBS= Clear.  

## 2020-06-23 NOTE — Progress Notes (Signed)
Called to room to assist during endoscopic procedure.  Patient ID and intended procedure confirmed with present staff. Received instructions for my participation in the procedure from the performing physician.  

## 2020-06-23 NOTE — Op Note (Signed)
[facility] Patient Name: Margaret Carey Procedure Date: 06/23/2020 8:07 AM MRN: [id number] Endoscopist: Mauri Pole , MD Age: 60 Referring MD:  Date of Birth: 1960-05-20 Gender: Female Account #: [id number] Procedure:                Colonoscopy Indications:              High risk colon cancer surveillance: Personal                            history of colonic polyps Medicines:                Monitored Anesthesia Care Procedure:                Pre-Anesthesia Assessment:                           - Prior to the procedure, a History and Physical                            was performed, and patient medications and                            allergies were reviewed. The patient's tolerance of                            previous anesthesia was also reviewed. The risks                            and benefits of the procedure and the sedation                            options and risks were discussed with the patient.                            All questions were answered, and informed consent                            was obtained. Prior Anticoagulants: The patient has                            taken no previous anticoagulant or antiplatelet                            agents. ASA Grade Assessment: III - A patient with                            severe systemic disease. After reviewing the risks                            and benefits, the patient was deemed in                            satisfactory condition to undergo the procedure.  After obtaining informed consent, the colonoscope                            was passed under direct vision. Throughout the                            procedure, the patient's blood pressure, pulse, and                            oxygen saturations were monitored continuously. The                            Olympus PFC-[id number] (#[id number]) Colonoscope was                            introduced through the anus and  advanced to the the                            cecum, identified by appendiceal orifice and                            ileocecal valve. The colonoscopy was performed                            without difficulty. The patient tolerated the                            procedure well. The quality of the bowel                            preparation was adequate. The ileocecal valve,                            appendiceal orifice, and rectum were photographed. Scope In: 8:14:32 AM Scope Out: 8:48:37 AM Scope Withdrawal Time: 0 hours 23 minutes 57 seconds  Total Procedure Duration: 0 hours 34 minutes 5 seconds  Findings:                 The perianal and digital rectal examinations were                            normal.                           Two sessile polyps were found in the rectum and                            transverse colon. The polyps were 5 to 7 mm in                            size. These polyps were removed with a cold snare.                            Resection and retrieval were complete.  Three semi-pedunculated polyps were found in the                            sigmoid colon. The polyps were 10 to 15 mm in size.                            These polyps were removed with a hot snare.                            Resection and retrieval were complete.                           Scattered small and large-mouthed diverticula were                            found in the sigmoid colon, transverse colon and                            ascending colon.                           Non-bleeding external and internal hemorrhoids were                            found during retroflexion. The hemorrhoids were                            medium-sized. Complications:            No immediate complications. Estimated Blood Loss:     Estimated blood loss was minimal. Impression:               - Two 5 to 7 mm polyps in the rectum and in the                             transverse colon, removed with a cold snare.                            Resected and retrieved.                           - Three 10 to 15 mm polyps in the sigmoid colon,                            removed with a hot snare. Resected and retrieved.                           - Diverticulosis in the sigmoid colon, in the                            transverse colon and in the ascending colon.                           - Non-bleeding external and internal hemorrhoids. Recommendation:           -  Patient has a contact number available for                            emergencies. The signs and symptoms of potential                            delayed complications were discussed with the                            patient. Return to normal activities tomorrow.                            Written discharge instructions were provided to the                            patient.                           - Resume previous diet.                           - Resume previous diet.                           - Continue present medications.                           - Await pathology results.                           - Repeat colonoscopy in 3 years for surveillance. Mauri Pole, MD 06/23/2020 9:02:08 AM This report has been signed electronically.

## 2020-06-23 NOTE — Progress Notes (Signed)
Pt's states no medical or surgical changes since previsit or office visit.   Check-in-JB  Vital Signs-NS

## 2020-06-23 NOTE — Patient Instructions (Signed)
Please read handouts provided. Continue present medications. Await pathology results. Repeat colonoscopy in 3 years.   YOU HAD AN ENDOSCOPIC PROCEDURE TODAY AT THE [facility]:   Refer to the procedure report that was given to you for any specific questions about what was found during the examination.  If the procedure report does not answer your questions, please call your gastroenterologist to clarify.  If you requested that your care partner not be given the details of your procedure findings, then the procedure report has been included in a sealed envelope for you to review at your convenience later.  YOU SHOULD EXPECT: Some feelings of bloating in the abdomen. Passage of more gas than usual.  Walking can help get rid of the air that was put into your GI tract during the procedure and reduce the bloating. If you had a lower endoscopy (such as a colonoscopy or flexible sigmoidoscopy) you may notice spotting of blood in your stool or on the toilet paper. If you underwent a bowel prep for your procedure, you may not have a normal bowel movement for a few days.  Please Note:  You might notice some irritation and congestion in your nose or some drainage.  This is from the oxygen used during your procedure.  There is no need for concern and it should clear up in a day or so.  SYMPTOMS TO REPORT IMMEDIATELY:  Following lower endoscopy (colonoscopy or flexible sigmoidoscopy):  Excessive amounts of blood in the stool  Significant tenderness or worsening of abdominal pains  Swelling of the abdomen that is new, acute  Fever of 100F or higher   For urgent or emergent issues, a gastroenterologist can be reached at any hour by calling [phone]. Do not use MyChart messaging for urgent concerns.    DIET:  We do recommend a small meal at first, but then you may proceed to your regular diet.  Drink plenty of fluids but you should avoid alcoholic beverages for 24 hours.  ACTIVITY:   You should plan to take it easy for the rest of today and you should NOT DRIVE or use heavy machinery until tomorrow (because of the sedation medicines used during the test).    FOLLOW UP: Our staff will call the number listed on your records 48-72 hours following your procedure to check on you and address any questions or concerns that you may have regarding the information given to you following your procedure. If we do not reach you, we will leave a message.  We will attempt to reach you two times.  During this call, we will ask if you have developed any symptoms of COVID 19. If you develop any symptoms (ie: fever, flu-like symptoms, shortness of breath, cough etc.) before then, please call [phone].  If you test positive for Covid 19 in the 2 weeks post procedure, please call and report this information to us.    If any biopsies were taken you will be contacted by phone or by letter within the next 1-3 weeks.  Please call us at [phone] if you have not heard about the biopsies in 3 weeks.    SIGNATURES/CONFIDENTIALITY: You and/or your care partner have signed paperwork which will be entered into your electronic medical record.  These signatures attest to the fact that that the information above on your After Visit Summary has been reviewed and is understood.  Full responsibility of the confidentiality of this discharge information lies with you and/or your care-partner.    care-partner.

## 2020-06-25 ENCOUNTER — Telehealth: Payer: Self-pay

## 2020-06-25 NOTE — Telephone Encounter (Signed)
  Follow up Call-  Call back number 06/23/2020  Post procedure Call Back phone  # [phone]  Permission to leave phone message Yes  Some recent data might be hidden     Patient questions:  Do you have a fever, pain , or abdominal swelling? No. Pain Score  0 *  Have you tolerated food without any problems? Yes.    Have you been able to return to your normal activities? Yes.    Do you have any questions about your discharge instructions: Diet   No. Medications  No. Follow up visit  No.  Do you have questions or concerns about your Care? No.  Actions: * If pain score is 4 or above: No action needed, pain <4.  1. Have you developed a fever since your procedure? no  2.   Have you had an respiratory symptoms (SOB or cough) since your procedure? no  3.   Have you tested positive for COVID 19 since your procedure no  4.   Have you had any family members/close contacts diagnosed with the COVID 19 since your procedure?  no   If yes to any of these questions please route to Joylene John, RN and Joella Prince, RN

## 2020-06-30 ENCOUNTER — Encounter: Payer: Self-pay | Admitting: Gastroenterology

## 2021-03-10 ENCOUNTER — Other Ambulatory Visit (HOSPITAL_COMMUNITY): Payer: Self-pay | Admitting: Family Medicine

## 2021-03-10 ENCOUNTER — Other Ambulatory Visit: Payer: Self-pay | Admitting: Family Medicine

## 2021-03-10 DIAGNOSIS — Z1231 Encounter for screening mammogram for malignant neoplasm of breast: Secondary | ICD-10-CM

## 2021-03-10 DIAGNOSIS — R011 Cardiac murmur, unspecified: Secondary | ICD-10-CM

## 2021-04-13 ENCOUNTER — Other Ambulatory Visit (HOSPITAL_COMMUNITY): Payer: 59

## 2021-04-20 ENCOUNTER — Other Ambulatory Visit: Payer: Self-pay

## 2021-04-20 ENCOUNTER — Ambulatory Visit (HOSPITAL_COMMUNITY): Payer: 59 | Attending: Cardiology

## 2021-04-20 DIAGNOSIS — R011 Cardiac murmur, unspecified: Secondary | ICD-10-CM | POA: Diagnosis not present

## 2021-04-20 LAB — ECHOCARDIOGRAM COMPLETE
Area-P 1/2: 2.28 cm2
S' Lateral: 3.1 cm

## 2021-08-13 ENCOUNTER — Ambulatory Visit: Payer: 59 | Attending: Orthopedic Surgery | Admitting: Physical Therapy

## 2021-08-13 ENCOUNTER — Encounter: Payer: Self-pay | Admitting: Physical Therapy

## 2021-08-13 DIAGNOSIS — M6281 Muscle weakness (generalized): Secondary | ICD-10-CM | POA: Insufficient documentation

## 2021-08-13 DIAGNOSIS — M5459 Other low back pain: Secondary | ICD-10-CM | POA: Diagnosis present

## 2021-08-13 DIAGNOSIS — M62830 Muscle spasm of back: Secondary | ICD-10-CM | POA: Insufficient documentation

## 2021-08-13 DIAGNOSIS — R293 Abnormal posture: Secondary | ICD-10-CM | POA: Insufficient documentation

## 2021-08-13 NOTE — Therapy (Signed)
?OUTPATIENT PHYSICAL THERAPY THORACOLUMBAR EVALUATION ? ? ?Patient Name: Margaret Carey ?MRN: [id number] ?DOB:14-Jan-1961, 61 y.o., female ?Today's Date: 08/13/2021 ? ? PT End of Session - 08/13/21 0805   ? ? Visit Number 1   ? Authorization Type [employer] [id number]   ? PT Start Time 0805   ? ?  ?  ? ?  ? ? ?Past Medical History:  ?Diagnosis Date  ? Allergy   ? Arthritis   ? Chronic kidney disease   ? kidney failure 02/2018  ? Diabetes mellitus   ? GERD (gastroesophageal reflux disease)   ? Hypertension   ? Obese   ? Sleep apnea   ? no cpap  ? Thyroid disease   ? Hypothyroid  ? ?Past Surgical History:  ?Procedure Laterality Date  ? ABDOMINAL HYSTERECTOMY  2011  ? 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 CERVICAL CONE BIOPSY  1990  ? HERNIA REPAIR    ? HIP SURGERY    ? Bilateral replacement  ? IRRIGATION AND DEBRIDEMENT ABSCESS N/A 01/22/2018  ? Procedure: IRRIGATION AND DEBRIDEMENT BACK ABSCESS;  Surgeon: Excell Seltzer, MD;  Location: [facility];  Service: General;  Laterality: N/A;  ? OOPHORECTOMY    ? LSO and RSO  ? ?Patient Active Problem List  ? Diagnosis Date Noted  ? Abscess of back 01/22/2018  ? Severe sepsis with septic shock ([place]) 01/22/2018  ? Sepsis ([place]) 01/21/2018  ? AKI (acute kidney injury) ([place]) 01/21/2018  ? Hyponatremia 01/21/2018  ? DM (diabetes mellitus) ([place]) 01/21/2018  ? Thyroid disease   ? Hypertension   ? ? ?PCP: Shon Baton, MD ? ?REFERRING PROVIDER: Ladonna Snide, MD ? ?REFERRING DIAG: M54.16 Lumbar Radiculopathy ? ?THERAPY DIAG:  ?Other low back pain ? ?Abnormal posture ? ?Muscle weakness (generalized) ? ?Muscle spasm of back ? ?ONSET DATE: Dec 2023 ? ?SUBJECTIVE:                                                                                                                                                                                          ? ?SUBJECTIVE STATEMENT: ?Back pain started in December.  "PT and injections are the only things I have not tried. "  Worried about prognosis and maintaining  independence and possibility of back surgery.  Pain has improved since December when severe pain radiated down her leg.  Finds herself wearing ice pack at work.  Has history of back pain since [age].   ?  ?PERTINENT HISTORY:  ?History of bil THR (L revised), abdominal hysterectomy, HTN, diabetes, kidney disease, GERD, thyroid disease, OA, smoker ? ?PAIN:  ?Are you having pain? Yes: NPRS scale: 3/10, end  of day 8/10  ?Pain location: back radiating down LLE ?Pain description: ache ?Aggravating factors: walking (>10 min), standing, prolonged sitting (> 30 min), interferes with sleep, wakes up ?Relieving factors: ice ? ? ?PRECAUTIONS: None ? ?WEIGHT BEARING RESTRICTIONS No ? ?FALLS:  ?Has patient fallen in last 6 months? No ? ?LIVING ENVIRONMENT: ?Lives with: lives alone ?Lives in: House/apartment ?Stairs: Yes: External: 3 steps; none ?Has following equipment at home: None ? 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 ?PLOF: Independent ? ?PATIENT GOALS: don't want to deal with this, don't want to be scared because I'm afraid I'm going to end up paralyzed.  ? ? ?OBJECTIVE:  ? ?DIAGNOSTIC FINDINGS:  ?MRI lumbar - multilevel degenerative changes, severe bilateral neural foraminal narrowing L5-S1, moderated bil neural foraminal narrowing L2-L3 and L3-L4.  Mild dextroconvex scoliosis, apex at L2 ? ?PATIENT SURVEYS:  ?FOTO 38%.  Predicted outcome 55% after 12 visits ? ?SCREENING FOR RED FLAGS: ?Bowel or bladder incontinence: No ?Spinal tumors: No ?Cauda equina syndrome: No ?Compression fracture: No ?Abdominal aneurysm: No ? ?COGNITION: ? Overall cognitive status: Within functional limits for tasks assessed   ?  ?SENSATION: ?"Skin feels plastic on left side " along L L4/5 dermatomes ? ?MUSCLE LENGTH: ?Hamstrings: Right 75 deg; Left 45 deg limited by pain   ?Prone knee bend: Right 90 deg; Left 90 deg ? ?POSTURE:  ?Forward flexed posture, lumbar scoliosis L shoulder higher than R, decreased lumbar lordosis,  forward head posture.  ? ?PALPATION: ?Tenderness throughout lumbar paraspinals, L1-L5 with PA mobs, hypomobile, bil SIJ L>R, L glutes/piriformis.   ? ?LUMBAR ROM:  ? ?Active  A/PROM  ?08/13/2021  ?Flexion 50% limitation  ?Extension 75% limited  ?Right lateral flexion Slight better than R but increased pain  ?Left lateral flexion Mid thigh pain  ?Right rotation Inc pain limited 50%  ?Left rotation Inc pain limited 75% L  ? (Blank rows = not tested) ? ?LE ROM: ? ?Passive  Right ?08/13/2021 Left ?08/13/2021  ?Hip flexion    ?Hip extension    ?Hip abduction    ?Hip adduction    ?Hip internal rotation Very limited < 10 deg  Very limited <10 deg   ?Hip external rotation  Decreased 50% compared to R  ?Knee flexion    ?Knee extension    ? (Blank rows = not tested) ? ?LE MMT: ? ?MMT Right ?08/13/2021 Left ?08/13/2021  ?Hip flexion 5 4* revised THR  ?Hip extension    ?Hip abduction 5 5  ?Hip adduction 5 4+  ?Hip internal rotation    ?Hip external rotation    ?Knee flexion 5 5  ?Knee extension 5 5  ?Ankle dorsiflexion 5 5  ?Ankle plantarflexion 5 5  ? (Blank rows = not tested) ? ?LUMBAR SPECIAL TESTS:  ?Straight leg raise test: Positive left ? ?FUNCTIONAL TESTS:  ?5 times sit to stand: 16.5 sec without UE assist ? ?GAIT: ?Distance walked: 50  ?Assistive device utilized: None ?Level of assistance: Complete Independence ?Comments: decreased gait speed, trendelenburg L ? ? ?TODAY'S TREATMENT  ?08/13/21 Self Care- see patient education ? ? ?PATIENT EDUCATION:  ?Education details: education on findings, imaging, plan of care, prognosis, interventions.  Given initial HEP just laying in prone x 3 min/day to start habituating to position.   ?Person educated: Patient ?Education method: Explanation ?Education comprehension: verbalized understanding ? ? ?HOME EXERCISE PROGRAM: ? ? ?ASSESSMENT: ? ?CLINICAL IMPRESSION: ?Patient is a 61 y.o. female who was seen today for physical therapy evaluation and treatment for  lumbar radiculopathy.  She  reports history of chronic LBP with exacerbation in December and pain radiating down L side.  Pain is interfering with sleep and job activities.  She also reports worsening of scoliosis.  Imaging demonstrates multilevel degenerative changes and foraminal narrowing L2-L5.   Her FOTO of 38% indicates severe impairments due to LBP.  She also has history of L TKR revision on L decreasing hip ROM and strength.  She would benefit from skilled physical therapy to improve posture, core strength, decrease LBP and improve activity tolerance.  ? ?OBJECTIVE IMPAIRMENTS Abnormal gait, decreased activity tolerance, decreased endurance, decreased mobility, difficulty walking, decreased ROM, decreased strength, increased muscle spasms, impaired flexibility, impaired sensation, improper body mechanics, postural dysfunction, and pain.  ? ?ACTIVITY LIMITATIONS cleaning, community activity, meal prep, occupation, laundry, yard work, shopping, and sleeping .  ? ?PERSONAL FACTORS Fitness and 3+ comorbidities: DM, obesity, HTN, GERD, chronic pain  are also affecting patient's functional outcome.  ? ? ?REHAB POTENTIAL: Good ? ?CLINICAL DECISION MAKING: Evolving/moderate complexity ? ?EVALUATION COMPLEXITY: Moderate ? ? ?GOALS: ?Goals reviewed with patient? Yes ? ?SHORT TERM GOALS: Target date: 08/27/2021    ? ?Ind with initial HEP ?Baseline: ?Goal status: INITIAL ? ? ?LONG TERM GOALS: Target date: 09/24/2021   ? ?Ind with progressed HEP to improve carryover ?Baseline:  ?Goal status: INITIAL ? ?2.  Report 75% improvement in low back pain at end of work day.  ?Baseline: 8-9.10 end of day ?Goal status: INITIAL ? ?3.  Report 75% decrease in sleep disruption due to LBP.  ?Baseline: difficulty falling asleep, frequent wakes for 1+hour ?Goal status: INITIAL ? ?4.  Improve FOTO to 55% to demonstrate improved function.  ?Baseline: 38% ?Goal status: INITIAL ? ? ?PLAN: ?PT FREQUENCY: 2x/week ? ?PT DURATION: 6 weeks ? ?PLANNED INTERVENTIONS: Therapeutic  exercises, Therapeutic activity, Neuromuscular re-education, Balance training, Gait training, Patient/Family education, Joint mobilization, Stair training, Dry Needling, Spinal mobilization, Cryotherapy, Mo

## 2021-08-17 NOTE — Therapy (Addendum)
?OUTPATIENT PHYSICAL THERAPY THORACOLUMBAR TREATMENT ? ? ?Patient Name: Margaret Carey ?MRN: [id number] ?DOB:[birth year], 61 y.o., female ?Today's Date: 08/18/2021 ? ? PT End of Session - 08/18/21 0758   ? ? Visit Number 2   ? Authorization Type [employer]et[employer] VL [id number]   ? PT Start Time 0800   ? PT Stop Time 0845   ? PT Time Calculation (min) 45 min   ? Activity Tolerance Patient tolerated treatment well   ? Behavior During Therapy [facility] for tasks assessed/performed   ? ?  ?  ? ?  ? ? ? ?Past Medical History:  ?Diagnosis Date  ? Allergy   ? Arthritis   ? Chronic kidney disease   ? kidney failure 02/2018  ? Diabetes mellitus   ? GERD (gastroesophageal reflux disease)   ? Hypertension   ? Obese   ? Sleep apnea   ? no cpap  ? Thyroid disease   ? Hypothyroid  ? ?Past Surgical History:  ?Procedure Laterality Date  ? ABDOMINAL HYSTERECTOMY  2011  ? 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 CERVICAL CONE BIOPSY  1990  ? HERNIA REPAIR    ? HIP SURGERY    ? Bilateral replacement  ? IRRIGATION AND DEBRIDEMENT ABSCESS N/A 01/22/2018  ? Procedure: IRRIGATION AND DEBRIDEMENT BACK ABSCESS;  Surgeon: Excell Seltzer, MD;  Location: [facility];  Service: General;  Laterality: N/A;  ? OOPHORECTOMY    ? LSO and RSO  ? ?Patient Active Problem List  ? Diagnosis Date Noted  ? Abscess of back 01/22/2018  ? Severe sepsis with septic shock ([place]) 01/22/2018  ? Sepsis ([place]) 01/21/2018  ? AKI (acute kidney injury) ([place]) 01/21/2018  ? Hyponatremia 01/21/2018  ? DM (diabetes mellitus) ([place]) 01/21/2018  ? Thyroid disease   ? Hypertension   ? ? ?PCP: Shon Baton, MD ? ?REFERRING PROVIDER: Ladonna Snide, MD ? ?REFERRING DIAG: M54.16 Lumbar Radiculopathy ? ?THERAPY DIAG:  ?Other low back pain ? ?Abnormal posture ? ?Muscle weakness (generalized) ? ?Muscle spasm of back ? ?ONSET DATE: Dec 2023 ? ?SUBJECTIVE:                                                                                                                                                                                           ? ?SUBJECTIVE STATEMENT: ?Tried lying on my stomach but it didn't help.  ?  ?  ?PERTINENT HISTORY:  ?History of bil THR (L revised), abdominal hysterectomy, HTN, diabetes, kidney disease, GERD, thyroid disease, OA, smoker ? ?PAIN:  ?Are you having pain? Yes: NPRS scale: 2/10  ?Pain location: back radiating down LLE ?Pain description: ache ?Aggravating factors: walking (>10  min), standing, prolonged sitting (> 30 min), interferes with sleep, wakes up ?Relieving factors: ice ? ? ?PRECAUTIONS: None ? ?WEIGHT BEARING RESTRICTIONS No ? ?FALLS:  ?Has patient fallen in last 6 months? No ? ?LIVING ENVIRONMENT: ?Lives with: lives alone ?Lives in: House/apartment ?Stairs: Yes: External: 3 steps; none ?Has following equipment at home: None ? 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 ?PLOF: Independent ? ?PATIENT GOALS: don't want to deal with this, don't want to be scared because I'm afraid I'm going to end up paralyzed.  ? ? ?OBJECTIVE:  from initial evaluation unless dated ? ?DIAGNOSTIC FINDINGS:  ?MRI lumbar - multilevel degenerative changes, severe bilateral neural foraminal narrowing L5-S1, moderated bil neural foraminal narrowing L2-L3 and L3-L4.  Mild dextroconvex scoliosis, apex at L2 ? ?PATIENT SURVEYS:  ?FOTO 38%.  Predicted outcome 55% after 12 visits ? ?SCREENING FOR RED FLAGS: ?Bowel or bladder incontinence: No ?Spinal tumors: No ?Cauda equina syndrome: No ?Compression fracture: No ?Abdominal aneurysm: No ? ?COGNITION: ? Overall cognitive status: Within functional limits for tasks assessed   ?  ?SENSATION: ?"Skin feels plastic on left side " along L L4/5 dermatomes ? ?MUSCLE LENGTH: ?Hamstrings: Right 75 deg; Left 45 deg limited by pain   ?Prone knee bend: Right 90 deg; Left 90 deg ? ?POSTURE:  ?Forward flexed posture, lumbar scoliosis L shoulder higher than R, decreased lumbar lordosis, forward head posture.  ? ?PALPATION: ?Tenderness throughout lumbar paraspinals, L1-L5 with  PA mobs, hypomobile, bil SIJ L>R, L glutes/piriformis.   ? ?LUMBAR ROM:  ? ?Active  A/PROM  ?08/18/2021  ?Flexion 50% limitation  ?Extension 75% limited  ?Right lateral flexion Slight better than R but increased pain  ?Left lateral flexion Mid thigh pain  ?Right rotation Inc pain limited 50%  ?Left rotation Inc pain limited 75% L  ? (Blank rows = not tested) ? ?LE ROM: ? ?Passive  Right ?08/18/2021 Left ?08/18/2021  ?Hip flexion    ?Hip extension    ?Hip abduction    ?Hip adduction    ?Hip internal rotation Very limited < 10 deg  Very limited <10 deg   ?Hip external rotation  Decreased 50% compared to R  ?Knee flexion    ?Knee extension    ? (Blank rows = not tested) ? ?LE MMT: ? ?MMT Right ?08/18/2021 Left ?08/18/2021  ?Hip flexion 5 4* revised THR  ?Hip extension    ?Hip abduction 5 5  ?Hip adduction 5 4+  ?Hip internal rotation    ?Hip external rotation    ?Knee flexion 5 5  ?Knee extension 5 5  ?Ankle dorsiflexion 5 5  ?Ankle plantarflexion 5 5  ? (Blank rows = not tested) ? ?LUMBAR SPECIAL TESTS:  ?Straight leg raise test: Positive left ? ?FUNCTIONAL TESTS:  ?5 times sit to stand: 16.5 sec without UE assist ? ? ? ?TODAY'S TREATMENT  ? ? 08/18/21   ?Elliptical L2 x 3 min ?Standing lumbar ext x 10 ?Worked on finding appropriate QL stretch: standing too difficult, SDLY left side over pillow x 2 minutes. ?Hooklying: ab set 5 sec hold x 3 (worked on isolating from obliques); added marching x 10, heel slides 2x 5 left, 1x 5 R (no problem) ? Bridging x 10 ?Prone: prone lying; POE x 1 min, prone lying again ?Sit to stand x 10 ?Standing rows and shoulder ext GTB 2x10 ea ?Manual: UPA mobs to bil lumbar gd II and III; POE after felt much more comfortable. ? ? ? ?08/13/21 Self Care- see patient  education ? ? ?PATIENT EDUCATION:  ?Education details: HEP ?Person educated: Patient ?Education method: Explanation ?Education comprehension: verbalized understanding ? ? ?HOME EXERCISE PROGRAM: ?Access Code: [email or web address] ?URL:  [email or web address] ?Date: 08/18/2021 ?Prepared by: Almyra Free ? ?Exercises ?- Supine Bridge  - 2 x daily - 7 x weekly - 1-3 sets - 10 reps ?- Supine Transversus Abdominis Bracing with Heel Slide  - 2 x daily - 7 x weekly - 1-2 sets - 10 reps ?- Supine March  - 2 x daily - 7 x weekly - 1-2 sets - 10 reps ?- Sidelying ITB Stretch off Table  - 2 x daily - 7 x weekly - 1 sets - 3 reps - 60 seconds hold ?- Standing Bilateral Low Shoulder Row with Anchored Resistance  - 1 x daily - 3 x weekly - 2-3 sets - 10 reps ?- Shoulder extension with resistance - Neutral  - 1 x daily - 3 x weekly - 3 sets - 10 reps ? ? ?ASSESSMENT: ? ?CLINICAL IMPRESSION: ? ?Margaret Carey did very well with her initial treatment visit. She tolerated initial exercises without increased pain. She responded well to PA mobs reporting decreased pain and increased mobility afterwards. 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 ? ? ?OBJECTIVE IMPAIRMENTS Abnormal gait, decreased activity tolerance, decreased endurance, decreased mobility, difficulty walking, decreased ROM, decreased strength, increased muscle spasms, impaired flexibility, impaired sensation, improper body mechanics, postural dysfunction, and pain.  ? ?ACTIVITY LIMITATIONS cleaning, community activity, meal prep, occupation, laundry, yard work, shopping, and sleeping .  ? ?PERSONAL FACTORS Fitness and 3+ comorbidities: DM, obesity, HTN, GERD, chronic pain  are also affecting patient's functional outcome.  ? ? ?REHAB POTENTIAL: Good ? ?CLINICAL DECISION MAKING: Evolving/moderate complexity ? ?EVALUATION COMPLEXITY: Moderate ? ? ?GOALS: ?Goals reviewed with patient? Yes ? ?SHORT TERM GOALS: Target date: 08/27/2021    ? ?Ind with initial HEP ?Baseline: ?Goal status: INITIAL ? ? ?LONG TERM GOALS: Target date: 09/24/2021   ? ?Ind with progressed HEP to improve carryover ?Baseline:  ?Goal status: INITIAL ? ?2.  Report 75% improvement in low back pain at  end of work day.  ?Baseline: 8-9.10 end of day ?Goal status: INITIAL ? ?3.  Report 75% decrease in sleep disruption due to LBP.  ?Baseline: difficulty falling asleep, frequent wakes for 1+hour ?Goal status: INITIAL

## 2021-08-18 ENCOUNTER — Encounter: Payer: Self-pay | Admitting: Physical Therapy

## 2021-08-18 ENCOUNTER — Ambulatory Visit: Payer: 59 | Admitting: Physical Therapy

## 2021-08-18 ENCOUNTER — Other Ambulatory Visit: Payer: Self-pay

## 2021-08-18 DIAGNOSIS — M6281 Muscle weakness (generalized): Secondary | ICD-10-CM

## 2021-08-18 DIAGNOSIS — R293 Abnormal posture: Secondary | ICD-10-CM

## 2021-08-18 DIAGNOSIS — M62830 Muscle spasm of back: Secondary | ICD-10-CM

## 2021-08-18 DIAGNOSIS — M5459 Other low back pain: Secondary | ICD-10-CM

## 2021-08-19 ENCOUNTER — Encounter: Payer: Self-pay | Admitting: Physical Therapy

## 2021-08-19 ENCOUNTER — Ambulatory Visit: Payer: 59 | Admitting: Physical Therapy

## 2021-08-19 DIAGNOSIS — M5459 Other low back pain: Secondary | ICD-10-CM | POA: Diagnosis not present

## 2021-08-19 DIAGNOSIS — M62830 Muscle spasm of back: Secondary | ICD-10-CM

## 2021-08-19 DIAGNOSIS — R293 Abnormal posture: Secondary | ICD-10-CM

## 2021-08-19 DIAGNOSIS — M6281 Muscle weakness (generalized): Secondary | ICD-10-CM

## 2021-08-19 NOTE — Therapy (Signed)
?OUTPATIENT PHYSICAL THERAPY TREATMENT NOTE ? ? ?Patient Name: Margaret Carey ?MRN: [id number] ?DOB:12/25/1960, 61 y.o., female ?Today's Date: 08/19/2021 ? ? PT End of Session - 08/19/21 0803   ? ? Visit Number 3   ? Number of Visits 12   ? Date for PT Re-Evaluation 09/30/21   ? Authorization Type Aetna VL [id number]   ? Progress Note Due on Visit 10   ? PT Start Time [phone]   ? PT Stop Time 0845   ? PT Time Calculation (min) 42 min   ? Activity Tolerance Patient tolerated treatment well   ? Behavior During Therapy [facility] for tasks assessed/performed   ? ?  ?  ? ?  ? ? ? ?Past Medical History:  ?Diagnosis Date  ? Allergy   ? Arthritis   ? Chronic kidney disease   ? kidney failure 02/2018  ? Diabetes mellitus   ? GERD (gastroesophageal reflux disease)   ? Hypertension   ? Obese   ? Sleep apnea   ? no cpap  ? Thyroid disease   ? Hypothyroid  ? ?Past Surgical History:  ?Procedure Laterality Date  ? ABDOMINAL HYSTERECTOMY  2011  ? 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 CERVICAL CONE BIOPSY  1990  ? HERNIA REPAIR    ? HIP SURGERY    ? Bilateral replacement  ? IRRIGATION AND DEBRIDEMENT ABSCESS N/A 01/22/2018  ? Procedure: IRRIGATION AND DEBRIDEMENT BACK ABSCESS;  Surgeon: Excell Seltzer, MD;  Location: [facility];  Service: General;  Laterality: N/A;  ? OOPHORECTOMY    ? LSO and RSO  ? ?Patient Active Problem List  ? Diagnosis Date Noted  ? Abscess of back 01/22/2018  ? Severe sepsis with septic shock ([place]) 01/22/2018  ? Sepsis ([employer]) 01/21/2018  ? AKI (acute kidney injury) ([place]) 01/21/2018  ? Hyponatremia 01/21/2018  ? DM (diabetes mellitus) ([place]) 01/21/2018  ? Thyroid disease   ? Hypertension   ? ? ?PCP: Shon Baton, MD ? ?REFERRING PROVIDER: Ladonna Snide, MD ? ?REFERRING DIAG: M54.16 Lumbar Radiculopathy ? ?THERAPY DIAG:  ?Other low back pain ? ?Abnormal posture ? ?Muscle weakness (generalized) ? ?Muscle spasm of back ? ?ONSET DATE: Dec 2023 ? ?SUBJECTIVE:                                                                                                                                                                                           ? ?SUBJECTIVE STATEMENT: ?Patient reports a lot of muscle soreness today, but back "isn't bad".  Compliant with HEP.   ?  ?  ?PERTINENT HISTORY:  ?History of bil THR (L revised), abdominal hysterectomy, HTN, diabetes,  kidney disease, GERD, thyroid disease, OA, smoker ? ?PAIN:  ?Are you having pain? Yes: NPRS scale: 2/10  ?Pain location: back radiating down LLE ?Pain description: ache ? ? ?PRECAUTIONS: None ? ? ?PATIENT GOALS: don't want to deal with this, don't want to be scared because I'm afraid I'm going to end up paralyzed.  ? ? ?OBJECTIVE:  from initial evaluation unless dated ? ?DIAGNOSTIC FINDINGS:  ?MRI lumbar - multilevel degenerative changes, severe bilateral neural foraminal narrowing L5-S1, moderated bil neural foraminal narrowing L2-L3 and L3-L4.  Mild dextroconvex scoliosis, apex at L2 ? ?PATIENT SURVEYS:  ?FOTO 38%.  Predicted outcome 55% after 12 visits ? ?SENSATION: ?"Skin feels plastic on left side " along L L4/5 dermatomes ? ?MUSCLE LENGTH: ?Hamstrings: Right 75 deg; Left 45 deg limited by pain   ?Prone knee bend: Right 90 deg; Left 90 deg ? ?POSTURE:  ?Forward flexed posture, lumbar scoliosis L shoulder higher than R, decreased lumbar lordosis, forward head posture.  ? ?PALPATION: ?Tenderness throughout lumbar paraspinals, L1-L5 with PA mobs, hypomobile, bil SIJ L>R, L glutes/piriformis.   ? ?LUMBAR ROM:  ? ?Active  A/PROM  ?08/19/2021  ?Flexion 50% limitation  ?Extension 75% limited  ?Right lateral flexion Slight better than R but increased pain  ?Left lateral flexion Mid thigh pain  ?Right rotation Inc pain limited 50%  ?Left rotation Inc pain limited 75% L  ? (Blank rows = not tested) ? ?LE ROM: ? ?Passive  Right ?08/19/2021 Left ?08/19/2021  ?Hip flexion    ?Hip extension    ?Hip abduction    ?Hip adduction    ?Hip internal rotation Very limited < 10 deg  Very limited <10 deg   ?Hip external  rotation  Decreased 50% compared to R  ?Knee flexion    ?Knee extension    ? (Blank rows = not tested) ? ?LE MMT: ? ?MMT Right ?08/19/2021 Left ?08/19/2021  ?Hip flexion 5 4* revised THR  ?Hip extension    ?Hip abduction 5 5  ?Hip adduction 5 4+  ?Hip internal rotation    ?Hip external rotation    ?Knee flexion 5 5  ?Knee extension 5 5  ?Ankle dorsiflexion 5 5  ?Ankle plantarflexion 5 5  ? (Blank rows = not tested) ? ?LUMBAR SPECIAL TESTS:  ?Straight leg raise test: Positive left ? ?FUNCTIONAL TESTS:  ?5 times sit to stand: 16.5 sec without UE assist ? ? ? ?TODAY'S TREATMENT  ?08/19/2021 ?Therapeutic Activities: education with lifting and ADLs like vacuuming, demo and return demo, practice lifting 10lb weight from low position to counter, picking up stool from floor 10x with wide stance, golfer's lift.  ? ?Therapeutic Exercise: to improve strength and mobility.  Verbal and tactile cues throughout for technique. ?-forward T-s x 10 bil ?-lunges with counter x 10 bil  ?-gait x 6 min for warm up with break after 300' for gentle flexion ?-seated flexion with green p-ball x 10 ?-review HEP and LTR ? ? 08/18/21   ?Elliptical L2 x 3 min ?Standing lumbar ext x 10 ?Worked on finding appropriate QL stretch: standing too difficult, SDLY left side over pillow x 2 minutes. ?Hooklying: ab set 5 sec hold x 3 (worked on isolating from obliques); added marching x 10, heel slides 2x 5 left, 1x 5 R (no problem) ? Bridging x 10 ?Prone: prone lying; POE x 1 min, prone lying again ?Sit to stand x 10 ?Standing rows and shoulder ext GTB 2x10 ea ?Manual: UPA mobs to bil lumbar gd II and  III; POE after felt much more comfortable. ? ?08/13/21 Self Care- see patient education ? ? ?PATIENT EDUCATION:  ?Education details: HEP update, lifting techniques ?Person educated: Patient ?Education method: Explanation ?Education comprehension: verbalized understanding ? ? ?HOME EXERCISE PROGRAM: ?Access Code: [email or web address] ? ?Exercises Added ?- Forward T with  Counter Support  - 1 x daily - 7 x weekly - 3 sets - 10 reps ?- Forward Lunge with Counter Support  - 1 x daily - 7 x weekly - 3 sets - 10 reps ? ?Patient Education ?- Lifting Techniques ? ?ASSESSMENT: ? ?CLINICAL IMPRESSION: ?Verble Styron More reported increased muscle soreness today, but good compliance with HEP and improved outlook on recovery.  Focus of today's skilled intervention was on lifting and body mechanics with ADLs, as she wants to open up pool and has difficulty lifting.  She was able to return demonstration without increased LBP.  She also reported fear of hip flexion since her THR, explained that was for immediate post-surgical period and ok to flex to tolerance now.  Reviewed and updated HEP for exercises to help with lifting and ADLs.  She would benefit from continued skilled therapy.  ? ? ?OBJECTIVE IMPAIRMENTS Abnormal gait, decreased activity tolerance, decreased endurance, decreased mobility, difficulty walking, decreased ROM, decreased strength, increased muscle spasms, impaired flexibility, impaired sensation, improper body mechanics, postural dysfunction, and pain.  ? ?ACTIVITY LIMITATIONS cleaning, community activity, meal prep, occupation, laundry, yard work, shopping, and sleeping .  ? ?PERSONAL FACTORS Fitness and 3+ comorbidities: DM, obesity, HTN, GERD, chronic pain  are also affecting patient's functional outcome.  ? ? ?REHAB POTENTIAL: Good ? ?CLINICAL DECISION MAKING: Evolving/moderate complexity ? ?EVALUATION COMPLEXITY: Moderate ? ? ?GOALS: ?Goals reviewed with patient? Yes ? ?SHORT TERM GOALS: Target date: 08/27/2021    ? ?Ind with initial HEP ?Baseline: ?Goal status: IN PROGRESS ? ? ?LONG TERM GOALS: Target date: 09/24/2021   ? ?Ind with progressed HEP to improve carryover ?Baseline:  ?Goal status: IN PROGRESS ? ?2.  Report 75% improvement in low back pain at end of work day.  ?Baseline: 8-9.10 end of day ?Goal status: IN PROGRESS ? ?3.  Report 75% decrease in sleep disruption due  to LBP.  ?Baseline: difficulty falling asleep, frequent wakes for 1+hour ?Goal status: IN PROGRESS ? ?4.  Improve FOTO to 55% to demonstrate improved function.  ?Baseline: 38% ?Goal status: IN PROGRESS ? ? ?PL

## 2021-08-24 NOTE — Therapy (Incomplete)
OUTPATIENT PHYSICAL THERAPY TREATMENT NOTE  Patient Name: Margaret Carey MRN: [id number] DOB:[birth year], 61 y.o., female Today's Date: 08/25/2021  Rationale for Evaluation and Treatment Rehabilitation    PT End of Session - 08/25/21 0759     Visit Number 4    Number of Visits 12    Date for PT Re-Evaluation 09/30/21    Authorization Type [employer] [id number]    Progress Note Due on Visit 10    PT Start Time 0759    PT Stop Time 0844    PT Time Calculation (min) 45 min    Activity Tolerance Patient tolerated treatment well    Behavior During Therapy [facility] for tasks assessed/performed               Past Medical History:  Diagnosis Date   Allergy    Arthritis    Chronic kidney disease    kidney failure 02/2018   Diabetes mellitus    GERD (gastroesophageal reflux disease)    Hypertension    Obese    Sleep apnea    no cpap   Thyroid disease    Hypothyroid   Past Surgical History:  Procedure Laterality Date   ABDOMINAL HYSTERECTOMY  2011   TAH RSO.teratoma/dr [place]     HIP SURGERY     Bilateral replacement   IRRIGATION AND DEBRIDEMENT ABSCESS N/A 01/22/2018   Procedure: IRRIGATION AND DEBRIDEMENT BACK ABSCESS;  Surgeon: Excell Seltzer, MD;  Location: [facility];  Service: General;  Laterality: N/A;   OOPHORECTOMY     LSO and RSO   Patient Active Problem List   Diagnosis Date Noted   Abscess of back 01/22/2018   Severe sepsis with septic shock ([place]) 01/22/2018   Sepsis ([place]) 01/21/2018   AKI (acute kidney injury) ([place]) 01/21/2018   Hyponatremia 01/21/2018   DM (diabetes mellitus) ([place]) 01/21/2018   Thyroid disease    Hypertension     PCP: Shon Baton, MD  REFERRING PROVIDER: Ladonna Snide, MD  REFERRING DIAG: M54.16 Lumbar Radiculopathy  THERAPY DIAG:  Other low back pain  Abnormal posture  Muscle weakness (generalized)  Muscle spasm of back  ONSET DATE: Dec 2023  SUBJECTIVE:                                                                                                                                                                                            SUBJECTIVE STATEMENT: Patient stating she is doing her HEP and feels her muscles working. She is incorporating body mechanics and ADL Modifications.     PERTINENT HISTORY:  History of bil THR (L revised), abdominal hysterectomy, HTN, diabetes, kidney disease, GERD, thyroid disease, OA, smoker  PAIN:  Are you having pain? No: NPRS scale: 0/10  Pain location: back radiating down LLE Pain description: ache   PRECAUTIONS: None   PATIENT GOALS: don't want to deal with this, don't want to be scared because I'm afraid I'm going to end up paralyzed.    OBJECTIVE:  from initial evaluation unless dated  DIAGNOSTIC FINDINGS:  MRI lumbar - multilevel degenerative changes, severe bilateral neural foraminal narrowing L5-S1, moderated bil neural foraminal narrowing L2-L3 and L3-L4.  Mild dextroconvex scoliosis, apex at L2  PATIENT SURVEYS:  FOTO 38%.  Predicted outcome 55% after 12 visits  SENSATION: "Skin feels plastic on left side " along L L4/5 dermatomes  MUSCLE LENGTH: Hamstrings: Right 75 deg; Left 45 deg limited by pain   Prone knee bend: Right 90 deg; Left 90 deg  POSTURE:  Forward flexed posture, lumbar scoliosis L shoulder higher than R, decreased lumbar lordosis, forward head posture.   PALPATION: Tenderness throughout lumbar paraspinals, L1-L5 with PA mobs, hypomobile, bil SIJ L>R, L glutes/piriformis.    LUMBAR ROM:   Active  A/PROM  08/13/2021  Flexion 50% limitation  Extension 75% limited  Right lateral flexion Slight better than R but increased pain  Left lateral flexion Mid thigh pain  Right rotation Inc pain limited 50%  Left rotation Inc pain limited 75% L   (Blank rows = not tested)  LE ROM:  Passive  Right 08/13/2021 Left 08/13/2021  Hip flexion    Hip extension    Hip abduction    Hip  adduction    Hip internal rotation Very limited < 10 deg  Very limited <10 deg   Hip external rotation  Decreased 50% compared to R  Knee flexion    Knee extension     (Blank rows = not tested)  LE MMT:  MMT Right 08/25/2021 Left 08/25/2021  Hip flexion 5 4* revised THR  Hip extension    Hip abduction 5 5  Hip adduction 5 5  Hip internal rotation    Hip external rotation    Knee flexion 5 5  Knee extension 5 5  Ankle dorsiflexion 5 5  Ankle plantarflexion 5 5   (Blank rows = not tested)  LUMBAR SPECIAL TESTS:  Straight leg raise test: Positive left  FUNCTIONAL TESTS:  5 times sit to stand: 16.5 sec without UE assist    TODAY'S TREATMENT  08/25/21: Therapeutic Exercise: to improve strength and mobility.  Verbal and tactile cues throughout for technique. -TM 1.5 mph x 5 min -seated flexion with green p-ball x 10; R and L 2x20 sec ea -functional squat to mat with blue foam pad  x 15 bil -lunges with counter x 10 bil  -bridge x 10, with green band x 10, bridge with clam GTB x 10 - reviewed ab set vs. Pelvic tilt. Ab set with march. -prone quad stretch with strap 2x30 sec ea -prone hip ext x 10 ea      08/19/2021 Therapeutic Activities: education with lifting and ADLs like vacuuming, demo and return demo, practice lifting 10lb weight from low position to counter, picking up stool from floor 10x with wide stance, golfer's lift.   Therapeutic Exercise: to improve strength and mobility.  Verbal and tactile cues throughout for technique. -forward T-s x 10 bil -lunges with counter x 10 bil  -gait x 6 min for warm up with break after 300' for  gentle flexion -seated flexion with green p-ball x 10 -review HEP and LTR   08/18/21   Elliptical L2 x 3 min Standing lumbar ext x 10 Worked on finding appropriate QL stretch: standing too difficult, SDLY left side over pillow x 2 minutes. Hooklying: ab set 5 sec hold x 3 (worked on isolating from obliques); added marching x 10, heel  slides 2x 5 left, 1x 5 R (no problem)  Bridging x 10 Prone: prone lying; POE x 1 min, prone lying again Sit to stand x 10 Standing rows and shoulder ext GTB 2x10 ea Manual: UPA mobs to bil lumbar gd II and III; POE after felt much more comfortable.  08/13/21 Self Care- see patient education   PATIENT EDUCATION:  Education details: HEP update Person educated: Patient Education method: Explanation Education comprehension: verbalized understanding   HOME EXERCISE PROGRAM: Access Code: [email or web address]  Exercises Added  - Prone Quadriceps Stretch with Strap  - 2-3 x daily - 7 x weekly - 1 sets - 3 reps - 60 sec hold - Bridge with Hip Abduction and Resistance - Ground Touches  - 1 x daily - 3 x weekly - 2 sets - 10 reps - Squat with Chair Touch and Resistance Loop  - 1 x daily - 3 x weekly - 2 sets - 10 reps  ASSESSMENT:  CLINICAL IMPRESSION: Margaret Carey is progressing with her LTGs reporting 40% improvement with pain overall. She still has limited left hip flexion in sitting due to weakness and demonstrates decreased stability in the R low back with prone hip ext. Patient is compliant with HEP and with ADL modifications to prevent further injury. She will benefit from continued skilled PT to meet unmet goals.      OBJECTIVE IMPAIRMENTS Abnormal gait, decreased activity tolerance, decreased endurance, decreased mobility, difficulty walking, decreased ROM, decreased strength, increased muscle spasms, impaired flexibility, impaired sensation, improper body mechanics, postural dysfunction, and pain.   ACTIVITY LIMITATIONS cleaning, community activity, meal prep, occupation, laundry, yard work, shopping, and sleeping .   PERSONAL FACTORS Fitness and 3+ comorbidities: DM, obesity, HTN, GERD, chronic pain  are also affecting patient's functional outcome.    REHAB POTENTIAL: Good  CLINICAL DECISION MAKING: Evolving/moderate complexity  EVALUATION COMPLEXITY: Moderate   GOALS: Goals reviewed with  patient? Yes  SHORT TERM GOALS: Target date: 08/27/2021     Ind with initial HEP Baseline: Goal status: IN PROGRESS   LONG TERM GOALS: Target date: 09/24/2021    Ind with progressed HEP to improve carryover Baseline:  Goal status: IN PROGRESS  2.  Report 75% improvement in low back pain at end of work day.  Baseline: 8-9.10 end of day; reports 40% improvement (08/25/21) Goal status: IN PROGRESS  3.  Report 75% decrease in sleep disruption due to LBP.  Baseline: difficulty falling asleep, frequent wakes for 1+hour; wakes less often from back pain Goal status: IN PROGRESS  4.  Improve FOTO to 55% to demonstrate improved function.  Baseline: 38% Goal status: IN PROGRESS   PLAN: PT FREQUENCY: 2x/week  PT DURATION: 6 weeks  PLANNED INTERVENTIONS: Therapeutic exercises, Therapeutic activity, Neuromuscular re-education, Balance training, Gait training, Patient/Family education, Joint mobilization, Stair training, Dry Needling, Spinal mobilization, Cryotherapy, Moist heat, Traction, Ultrasound, and Manual therapy.  PLAN FOR NEXT SESSION: Continue lumbar stabilization, neutral spine exercises, hip strength,  manual therapy/modalities PRN   Ariana Juul, PT,   08/25/2021, 9:47 AM

## 2021-08-25 ENCOUNTER — Ambulatory Visit: Payer: 59 | Admitting: Physical Therapy

## 2021-08-25 ENCOUNTER — Other Ambulatory Visit: Payer: Self-pay

## 2021-08-25 ENCOUNTER — Encounter: Payer: Self-pay | Admitting: Physical Therapy

## 2021-08-25 DIAGNOSIS — M5459 Other low back pain: Secondary | ICD-10-CM | POA: Diagnosis not present

## 2021-08-25 DIAGNOSIS — M62830 Muscle spasm of back: Secondary | ICD-10-CM

## 2021-08-25 DIAGNOSIS — R293 Abnormal posture: Secondary | ICD-10-CM

## 2021-08-25 DIAGNOSIS — M6281 Muscle weakness (generalized): Secondary | ICD-10-CM

## 2021-08-27 ENCOUNTER — Encounter: Payer: 59 | Admitting: Physical Therapy

## 2021-09-01 ENCOUNTER — Ambulatory Visit: Payer: 59 | Admitting: Physical Therapy

## 2021-09-03 ENCOUNTER — Ambulatory Visit: Payer: 59 | Attending: Orthopedic Surgery

## 2021-09-03 DIAGNOSIS — R293 Abnormal posture: Secondary | ICD-10-CM | POA: Diagnosis present

## 2021-09-03 DIAGNOSIS — M62830 Muscle spasm of back: Secondary | ICD-10-CM | POA: Insufficient documentation

## 2021-09-03 DIAGNOSIS — M6281 Muscle weakness (generalized): Secondary | ICD-10-CM | POA: Insufficient documentation

## 2021-09-03 DIAGNOSIS — M5459 Other low back pain: Secondary | ICD-10-CM | POA: Diagnosis present

## 2021-09-03 NOTE — Therapy (Signed)
OUTPATIENT PHYSICAL THERAPY TREATMENT NOTE  Patient Name: Margaret Carey MRN: [id number] DOB:October 22, 1960, 61 y.o., female Today's Date: 09/03/2021  Rationale for Evaluation and Treatment Rehabilitation    PT End of Session - 09/03/21 1702     Visit Number 5    Number of Visits 12    Date for PT Re-Evaluation 09/30/21    Authorization Type [employer] VL [id number]    Progress Note Due on Visit 10    PT Start Time 1618    PT Stop Time 1700    PT Time Calculation (min) 42 min    Activity Tolerance Patient tolerated treatment well    Behavior During Therapy [facility] for tasks assessed/performed                Past Medical History:  Diagnosis Date   Allergy    Arthritis    Chronic kidney disease    kidney failure 02/2018   Diabetes mellitus    GERD (gastroesophageal reflux disease)    Hypertension    Obese    Sleep apnea    no cpap   Thyroid disease    Hypothyroid   Past Surgical History:  Procedure Laterality Date   ABDOMINAL HYSTERECTOMY  2011   TAH RSO.teratoma/dr [place]     HIP SURGERY     Bilateral replacement   IRRIGATION AND DEBRIDEMENT ABSCESS N/A 01/22/2018   Procedure: IRRIGATION AND DEBRIDEMENT BACK ABSCESS;  Surgeon: Excell Seltzer, MD;  Location: [facility];  Service: General;  Laterality: N/A;   OOPHORECTOMY     LSO and RSO   Patient Active Problem List   Diagnosis Date Noted   Abscess of back 01/22/2018   Severe sepsis with septic shock ([place]) 01/22/2018   Sepsis ([place]) 01/21/2018   AKI (acute kidney injury) ([place]) 01/21/2018   Hyponatremia 01/21/2018   DM (diabetes mellitus) ([place]) 01/21/2018   Thyroid disease    Hypertension     PCP: Shon Baton, MD  REFERRING PROVIDER: Ladonna Snide, MD  REFERRING DIAG: M54.16 Lumbar Radiculopathy  THERAPY DIAG:  Other low back pain  Abnormal posture  Muscle weakness (generalized)  Muscle spasm of back  ONSET DATE: Dec 2023  SUBJECTIVE:                                                                                                                                                                                            SUBJECTIVE STATEMENT: Pt reports that she is having more soreness and pain today because she has been at work.   PERTINENT HISTORY:  History of bil THR (  L revised), abdominal hysterectomy, HTN, diabetes, kidney disease, GERD, thyroid disease, OA, smoker  PAIN:  Are you having pain? No: NPRS scale: 5/10  Pain location: back radiating down LLE Pain description: ache   PRECAUTIONS: None   PATIENT GOALS: don't want to deal with this, don't want to be scared because I'm afraid I'm going to end up paralyzed.    OBJECTIVE:  from initial evaluation unless dated  DIAGNOSTIC FINDINGS:  MRI lumbar - multilevel degenerative changes, severe bilateral neural foraminal narrowing L5-S1, moderated bil neural foraminal narrowing L2-L3 and L3-L4.  Mild dextroconvex scoliosis, apex at L2  PATIENT SURVEYS:  FOTO 38%.  Predicted outcome 55% after 12 visits  SENSATION: "Skin feels plastic on left side " along L L4/5 dermatomes  MUSCLE LENGTH: Hamstrings: Right 75 deg; Left 45 deg limited by pain   Prone knee bend: Right 90 deg; Left 90 deg  POSTURE:  Forward flexed posture, lumbar scoliosis L shoulder higher than R, decreased lumbar lordosis, forward head posture.   PALPATION: Tenderness throughout lumbar paraspinals, L1-L5 with PA mobs, hypomobile, bil SIJ L>R, L glutes/piriformis.    LUMBAR ROM:   Active  A/PROM  08/13/2021  Flexion 50% limitation  Extension 75% limited  Right lateral flexion Slight better than R but increased pain  Left lateral flexion Mid thigh pain  Right rotation Inc pain limited 50%  Left rotation Inc pain limited 75% L   (Blank rows = not tested)  LE ROM:  Passive  Right 08/13/2021 Left 08/13/2021  Hip flexion    Hip extension    Hip abduction    Hip adduction    Hip internal rotation Very  limited < 10 deg  Very limited <10 deg   Hip external rotation  Decreased 50% compared to R  Knee flexion    Knee extension     (Blank rows = not tested)  LE MMT:  MMT Right 08/25/2021 Left 08/25/2021  Hip flexion 5 4* revised THR  Hip extension    Hip abduction 5 5  Hip adduction 5 5  Hip internal rotation    Hip external rotation    Knee flexion 5 5  Knee extension 5 5  Ankle dorsiflexion 5 5  Ankle plantarflexion 5 5   (Blank rows = not tested)  LUMBAR SPECIAL TESTS:  Straight leg raise test: Positive left  FUNCTIONAL TESTS:  5 times sit to stand: 16.5 sec without UE assist    TODAY'S TREATMENT  6/123 Therapeutic Exercises: TM 1.5 mph x 5 min Standing scap retraction with GTB 2x10 Standing shoulder extension GTB 2x10 Seated lumbar stretch 2x30 sec Standing ER with GTB against doorframe 10x Standing horizontal ABD against doorframe 2x10 Seated lat pulls 2x10 20# Cybex knee flexion 20# 2x10 Cybex knee extension 10# 2x10  08/25/21: Therapeutic Exercise: to improve strength and mobility.  Verbal and tactile cues throughout for technique. -TM 1.5 mph x 5 min -seated flexion with green p-ball x 10; R and L 2x20 sec ea -functional squat to mat with blue foam pad  x 15 bil -lunges with counter x 10 bil  -bridge x 10, with green band x 10, bridge with clam GTB x 10 - reviewed ab set vs. Pelvic tilt. Ab set with march. -prone quad stretch with strap 2x30 sec ea -prone hip ext x 10 ea      08/19/2021 Therapeutic Activities: education with lifting and ADLs like vacuuming, demo and return demo, practice lifting 10lb weight from low position to counter, picking up  stool from floor 10x with wide stance, golfer's lift.   Therapeutic Exercise: to improve strength and mobility.  Verbal and tactile cues throughout for technique. -forward T-s x 10 bil -lunges with counter x 10 bil  -gait x 6 min for warm up with break after 300' for gentle flexion -seated flexion with  green p-ball x 10 -review HEP and LTR   08/18/21   Elliptical L2 x 3 min Standing lumbar ext x 10 Worked on finding appropriate QL stretch: standing too difficult, SDLY left side over pillow x 2 minutes. Hooklying: ab set 5 sec hold x 3 (worked on isolating from obliques); added marching x 10, heel slides 2x 5 left, 1x 5 R (no problem)  Bridging x 10 Prone: prone lying; POE x 1 min, prone lying again Sit to stand x 10 Standing rows and shoulder ext GTB 2x10 ea Manual: UPA mobs to bil lumbar gd II and III; POE after felt much more comfortable.  08/13/21 Self Care- see patient education   PATIENT EDUCATION:  Education details: HEP update Person educated: Patient Education method: Explanation Education comprehension: verbalized understanding   HOME EXERCISE PROGRAM: Access Code: [email or web address]  Exercises Added  - Prone Quadriceps Stretch with Strap  - 2-3 x daily - 7 x weekly - 1 sets - 3 reps - 60 sec hold - Bridge with Hip Abduction and Resistance - Ground Touches  - 1 x daily - 3 x weekly - 2 sets - 10 reps - Squat with Chair Touch and Resistance Loop  - 1 x daily - 3 x weekly - 2 sets - 10 reps  ASSESSMENT:  CLINICAL IMPRESSION: Pt denies any concerns with HEP. Worked more on posture today as she presented with very fwd head and kyphotic posture. Cueing provided to promote scapular retraction with postural exercises. Cues also for core activation with weight machines for core stabilization. Pt had a good response to treatment.      OBJECTIVE IMPAIRMENTS Abnormal gait, decreased activity tolerance, decreased endurance, decreased mobility, difficulty walking, decreased ROM, decreased strength, increased muscle spasms, impaired flexibility, impaired sensation, improper body mechanics, postural dysfunction, and pain.   ACTIVITY LIMITATIONS cleaning, community activity, meal prep, occupation, laundry, yard work, shopping, and sleeping .   PERSONAL FACTORS Fitness and 3+ comorbidities:  DM, obesity, HTN, GERD, chronic pain  are also affecting patient's functional outcome.    REHAB POTENTIAL: Good  CLINICAL DECISION MAKING: Evolving/moderate complexity  EVALUATION COMPLEXITY: Moderate   GOALS: Goals reviewed with patient? Yes  SHORT TERM GOALS: Target date: 08/27/2021     Ind with initial HEP Baseline: Goal status: MET   LONG TERM GOALS: Target date: 09/24/2021    Ind with progressed HEP to improve carryover Baseline:  Goal status: IN PROGRESS  2.  Report 75% improvement in low back pain at end of work day.  Baseline: 8-9.10 end of day; reports 40% improvement (08/25/21) Goal status: IN PROGRESS  3.  Report 75% decrease in sleep disruption due to LBP.  Baseline: difficulty falling asleep, frequent wakes for 1+hour; wakes less often from back pain Goal status: IN PROGRESS  4.  Improve FOTO to 55% to demonstrate improved function.  Baseline: 38% Goal status: IN PROGRESS   PLAN: PT FREQUENCY: 2x/week  PT DURATION: 6 weeks  PLANNED INTERVENTIONS: Therapeutic exercises, Therapeutic activity, Neuromuscular re-education, Balance training, Gait training, Patient/Family education, Joint mobilization, Stair training, Dry Needling, Spinal mobilization, Cryotherapy, Moist heat, Traction, Ultrasound, and Manual therapy.  PLAN FOR NEXT SESSION: Continue lumbar stabilization,  neutral spine exercises, hip strength,  manual therapy/modalities PRN   Artist Pais, PTA 09/03/2021, 5:12 PM

## 2021-09-08 ENCOUNTER — Ambulatory Visit: Payer: 59

## 2021-09-08 DIAGNOSIS — M5459 Other low back pain: Secondary | ICD-10-CM

## 2021-09-08 DIAGNOSIS — R293 Abnormal posture: Secondary | ICD-10-CM

## 2021-09-08 DIAGNOSIS — M62830 Muscle spasm of back: Secondary | ICD-10-CM

## 2021-09-08 DIAGNOSIS — M6281 Muscle weakness (generalized): Secondary | ICD-10-CM

## 2021-09-08 NOTE — Therapy (Signed)
OUTPATIENT PHYSICAL THERAPY TREATMENT NOTE  Patient Name: Margaret Carey MRN: [id number] DOB:12/16/1960, 61 y.o., female Today's Date: 09/08/2021  Rationale for Evaluation and Treatment Rehabilitation    PT End of Session - 09/08/21 1615     Visit Number 6    Number of Visits 12    Date for PT Re-Evaluation 09/30/21    Authorization Type [employer] [id number]    Progress Note Due on Visit 10    PT Start Time 1532    PT Stop Time 1612    PT Time Calculation (min) 40 min    Activity Tolerance Patient tolerated treatment well    Behavior During Therapy [facility] for tasks assessed/performed                 Past Medical History:  Diagnosis Date   Allergy    Arthritis    Chronic kidney disease    kidney failure 02/2018   Diabetes mellitus    GERD (gastroesophageal reflux disease)    Hypertension    Obese    Sleep apnea    no cpap   Thyroid disease    Hypothyroid   Past Surgical History:  Procedure Laterality Date   ABDOMINAL HYSTERECTOMY  2011   TAH RSO.teratoma/dr [place]     HIP SURGERY     Bilateral replacement   IRRIGATION AND DEBRIDEMENT ABSCESS N/A 01/22/2018   Procedure: IRRIGATION AND DEBRIDEMENT BACK ABSCESS;  Surgeon: Excell Seltzer, MD;  Location: [facility];  Service: General;  Laterality: N/A;   OOPHORECTOMY     LSO and RSO   Patient Active Problem List   Diagnosis Date Noted   Abscess of back 01/22/2018   Severe sepsis with septic shock ([place]) 01/22/2018   Sepsis ([place]) 01/21/2018   AKI (acute kidney injury) ([place]) 01/21/2018   Hyponatremia 01/21/2018   DM (diabetes mellitus) ([place]) 01/21/2018   Thyroid disease    Hypertension     PCP: Shon Baton, MD  REFERRING PROVIDER: Ladonna Snide, MD  REFERRING DIAG: M54.16 Lumbar Radiculopathy  THERAPY DIAG:  Other low back pain  Abnormal posture  Muscle weakness (generalized)  Muscle spasm of back  ONSET DATE: Dec 2023  SUBJECTIVE:                                                                                                                                                                                            SUBJECTIVE STATEMENT: Today I am hurting more from work in the back   PERTINENT HISTORY:  History of bil THR (L revised), abdominal hysterectomy, HTN, diabetes,  kidney disease, GERD, thyroid disease, OA, smoker  PAIN:  Are you having pain? No: NPRS scale: 6/10  Pain location: mid back Pain description: ache   PRECAUTIONS: None   PATIENT GOALS: don't want to deal with this, don't want to be scared because I'm afraid I'm going to end up paralyzed.    OBJECTIVE:  from initial evaluation unless dated  DIAGNOSTIC FINDINGS:  MRI lumbar - multilevel degenerative changes, severe bilateral neural foraminal narrowing L5-S1, moderated bil neural foraminal narrowing L2-L3 and L3-L4.  Mild dextroconvex scoliosis, apex at L2  PATIENT SURVEYS:  FOTO 38%.  Predicted outcome 55% after 12 visits  SENSATION: "Skin feels plastic on left side " along L L4/5 dermatomes  MUSCLE LENGTH: Hamstrings: Right 75 deg; Left 45 deg limited by pain   Prone knee bend: Right 90 deg; Left 90 deg  POSTURE:  Forward flexed posture, lumbar scoliosis L shoulder higher than R, decreased lumbar lordosis, forward head posture.   PALPATION: Tenderness throughout lumbar paraspinals, L1-L5 with PA mobs, hypomobile, bil SIJ L>R, L glutes/piriformis.    LUMBAR ROM:   Active  A/PROM  08/13/2021  Flexion 50% limitation  Extension 75% limited  Right lateral flexion Slight better than R but increased pain  Left lateral flexion Mid thigh pain  Right rotation Inc pain limited 50%  Left rotation Inc pain limited 75% L   (Blank rows = not tested)  LE ROM:  Passive  Right 08/13/2021 Left 08/13/2021  Hip flexion    Hip extension    Hip abduction    Hip adduction    Hip internal rotation Very limited < 10 deg  Very limited <10 deg   Hip external  rotation  Decreased 50% compared to R  Knee flexion    Knee extension     (Blank rows = not tested)  LE MMT:  MMT Right 08/25/2021 Left 08/25/2021  Hip flexion 5 4* revised THR  Hip extension    Hip abduction 5 5  Hip adduction 5 5  Hip internal rotation    Hip external rotation    Knee flexion 5 5  Knee extension 5 5  Ankle dorsiflexion 5 5  Ankle plantarflexion 5 5   (Blank rows = not tested)  LUMBAR SPECIAL TESTS:  Straight leg raise test: Positive left  FUNCTIONAL TESTS:  5 times sit to stand: 16.5 sec without UE assist    TODAY'S TREATMENT  09/08/21 Therapeutic Exercise: Nustep L4x56mn Seated piriformis stretch (modified pigeon) 2x10" R/L  Manual Therapy:  STM to B glutes, lumbar paraspinals, - sharp pain over L QL  6/123 Therapeutic Exercises: TM 1.5 mph x 5 min Standing scap retraction with GTB 2x10 Standing shoulder extension GTB 2x10 Seated lumbar stretch 2x30 sec Standing ER with GTB against doorframe 10x Standing horizontal ABD against doorframe 2x10 Seated lat pulls 2x10 20# Cybex knee flexion 20# 2x10 Cybex knee extension 10# 2x10  08/25/21: Therapeutic Exercise: to improve strength and mobility.  Verbal and tactile cues throughout for technique. -TM 1.5 mph x 5 min -seated flexion with green p-ball x 10; R and L 2x20 sec ea -functional squat to mat with blue foam pad  x 15 bil -lunges with counter x 10 bil  -bridge x 10, with green band x 10, bridge with clam GTB x 10 - reviewed ab set vs. Pelvic tilt. Ab set with march. -prone quad stretch with strap 2x30 sec ea -prone hip ext x 10 ea  PATIENT EDUCATION:  Education details: HEP update Person educated: Patient Education method: Explanation Education comprehension: verbalized understanding   HOME EXERCISE PROGRAM: Access Code: [email or web address]  Exercises Added  - Prone Quadriceps Stretch with Strap  - 2-3 x daily - 7 x weekly - 1 sets - 3 reps - 60 sec hold - Bridge with Hip  Abduction and Resistance - Ground Touches  - 1 x daily - 3 x weekly - 2 sets - 10 reps - Squat with Chair Touch and Resistance Loop  - 1 x daily - 3 x weekly - 2 sets - 10 reps  ASSESSMENT:  CLINICAL IMPRESSION: Pt came in with more LBP from standing long periods of time at work. Focused on MT today to decrease LBP and spasm. After manual she did demonstrate longer stride with gait and noted pain decrease from 6/10 to 4/10.      OBJECTIVE IMPAIRMENTS Abnormal gait, decreased activity tolerance, decreased endurance, decreased mobility, difficulty walking, decreased ROM, decreased strength, increased muscle spasms, impaired flexibility, impaired sensation, improper body mechanics, postural dysfunction, and pain.   ACTIVITY LIMITATIONS cleaning, community activity, meal prep, occupation, laundry, yard work, shopping, and sleeping .   PERSONAL FACTORS Fitness and 3+ comorbidities: DM, obesity, HTN, GERD, chronic pain  are also affecting patient's functional outcome.    REHAB POTENTIAL: Good  CLINICAL DECISION MAKING: Evolving/moderate complexity  EVALUATION COMPLEXITY: Moderate   GOALS: Goals reviewed with patient? Yes  SHORT TERM GOALS: Target date: 08/27/2021     Ind with initial HEP Baseline: Goal status: MET   LONG TERM GOALS: Target date: 09/24/2021    Ind with progressed HEP to improve carryover Baseline:  Goal status: IN PROGRESS  2.  Report 75% improvement in low back pain at end of work day.  Baseline: 8-9.10 end of day; reports 40% improvement (08/25/21) Goal status: IN PROGRESS  3.  Report 75% decrease in sleep disruption due to LBP.  Baseline: difficulty falling asleep, frequent wakes for 1+hour; wakes less often from back pain Goal status: IN PROGRESS  4.  Improve FOTO to 55% to demonstrate improved function.  Baseline: 38% Goal status: IN PROGRESS   PLAN: PT FREQUENCY: 2x/week  PT DURATION: 6 weeks  PLANNED INTERVENTIONS: Therapeutic exercises,  Therapeutic activity, Neuromuscular re-education, Balance training, Gait training, Patient/Family education, Joint mobilization, Stair training, Dry Needling, Spinal mobilization, Cryotherapy, Moist heat, Traction, Ultrasound, and Manual therapy.  PLAN FOR NEXT SESSION: Continue lumbar stabilization, neutral spine exercises, hip strength,  manual therapy/modalities PRN   Artist Pais, PTA 09/08/2021, 5:10 PM

## 2021-09-11 ENCOUNTER — Ambulatory Visit: Payer: 59 | Admitting: Physical Therapy

## 2021-09-11 ENCOUNTER — Encounter: Payer: Self-pay | Admitting: Physical Therapy

## 2021-09-11 DIAGNOSIS — M6281 Muscle weakness (generalized): Secondary | ICD-10-CM

## 2021-09-11 DIAGNOSIS — R293 Abnormal posture: Secondary | ICD-10-CM

## 2021-09-11 DIAGNOSIS — M5459 Other low back pain: Secondary | ICD-10-CM | POA: Diagnosis not present

## 2021-09-11 DIAGNOSIS — M62830 Muscle spasm of back: Secondary | ICD-10-CM

## 2021-09-11 NOTE — Therapy (Signed)
OUTPATIENT PHYSICAL THERAPY TREATMENT NOTE  Patient Name: AWA BACHICHA MRN: [id number] DOB:1961/01/12, 61 y.o., female Today's Date: 09/11/2021  Rationale for Evaluation and Treatment Rehabilitation    PT End of Session - 09/11/21 0806     Visit Number 7    Number of Visits 12    Date for PT Re-Evaluation 09/30/21    Authorization Type [employer] VL [id number]    Progress Note Due on Visit 10    PT Start Time 0804    PT Stop Time 0850    PT Time Calculation (min) 46 min    Activity Tolerance Patient tolerated treatment well    Behavior During Therapy [facility] for tasks assessed/performed                 Past Medical History:  Diagnosis Date   Allergy    Arthritis    Chronic kidney disease    kidney failure 02/2018   Diabetes mellitus    GERD (gastroesophageal reflux disease)    Hypertension    Obese    Sleep apnea    no cpap   Thyroid disease    Hypothyroid   Past Surgical History:  Procedure Laterality Date   ABDOMINAL HYSTERECTOMY  2011   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   CERVICAL CONE BIOPSY  1990   HERNIA REPAIR     HIP SURGERY     Bilateral replacement   IRRIGATION AND DEBRIDEMENT ABSCESS N/A 01/22/2018   Procedure: IRRIGATION AND DEBRIDEMENT BACK ABSCESS;  Surgeon: Excell Seltzer, MD;  Location: [facility];  Service: General;  Laterality: N/A;   OOPHORECTOMY     LSO and RSO   Patient Active Problem List   Diagnosis Date Noted   Abscess of back 01/22/2018   Severe sepsis with septic shock ([place]) 01/22/2018   Sepsis ([place]) 01/21/2018   AKI (acute kidney injury) ([place]) 01/21/2018   Hyponatremia 01/21/2018   DM (diabetes mellitus) ([place]) 01/21/2018   Thyroid disease    Hypertension     PCP: Shon Baton, MD  REFERRING PROVIDER: Ladonna Snide, MD  REFERRING DIAG: M54.16 Lumbar Radiculopathy  THERAPY DIAG:  Other low back pain  Abnormal posture  Muscle weakness (generalized)  Muscle spasm of back  ONSET DATE: Dec 2023  SUBJECTIVE:                                                                                                                                                                                            SUBJECTIVE STATEMENT: Patient reports not feeling well today, had to take meloxicam for pain, which makes her feel sick, so missed work yesterday.    PERTINENT  HISTORY:  History of bil THR (L revised), abdominal hysterectomy, HTN, diabetes, kidney disease, GERD, thyroid disease, OA, smoker  PAIN:  Are you having pain? No: NPRS scale: 4/10 - with meds Pain location: mid back Pain description: ache   PRECAUTIONS: None   PATIENT GOALS: don't want to deal with this, don't want to be scared because I'm afraid I'm going to end up paralyzed.    OBJECTIVE:  from initial evaluation unless dated  DIAGNOSTIC FINDINGS:  MRI lumbar - multilevel degenerative changes, severe bilateral neural foraminal narrowing L5-S1, moderated bil neural foraminal narrowing L2-L3 and L3-L4.  Mild dextroconvex scoliosis, apex at L2  PATIENT SURVEYS:  FOTO 38%.  Predicted outcome 55% after 12 visits  SENSATION: "Skin feels plastic on left side " along L L4/5 dermatomes  MUSCLE LENGTH: Hamstrings: Right 75 deg; Left 45 deg limited by pain   Prone knee bend: Right 90 deg; Left 90 deg  POSTURE:  Forward flexed posture, lumbar scoliosis L shoulder higher than R, decreased lumbar lordosis, forward head posture.   PALPATION: Tenderness throughout lumbar paraspinals, L1-L5 with PA mobs, hypomobile, bil SIJ L>R, L glutes/piriformis.    LUMBAR ROM:   Active  A/PROM  08/13/2021  Flexion 50% limitation  Extension 75% limited  Right lateral flexion Slight better than R but increased pain  Left lateral flexion Mid thigh pain  Right rotation Inc pain limited 50%  Left rotation Inc pain limited 75% L   (Blank rows = not tested)  LE ROM:  Passive  Right 08/13/2021 Left 08/13/2021  Hip flexion    Hip extension    Hip abduction    Hip adduction     Hip internal rotation Very limited < 10 deg  Very limited <10 deg   Hip external rotation  Decreased 50% compared to R  Knee flexion    Knee extension     (Blank rows = not tested)  LE MMT:  MMT Right 08/25/2021 Left 08/25/2021  Hip flexion 5 4* revised THR  Hip extension    Hip abduction 5 5  Hip adduction 5 5  Hip internal rotation    Hip external rotation    Knee flexion 5 5  Knee extension 5 5  Ankle dorsiflexion 5 5  Ankle plantarflexion 5 5   (Blank rows = not tested)  LUMBAR SPECIAL TESTS:  Straight leg raise test: Positive left  FUNCTIONAL TESTS:  5 times sit to stand: 16.5 sec without UE assist    TODAY'S TREATMENT  09/10/2021 Therapeutic Exercise: to improve strength and mobility.  Demo, verbal and tactile cues throughout for technique. Bike L1 x 5 min Lat pull downs standing 15# 2 x 10 Paloff press 10# x 10 bil, 5# x 10 bil Cybex rows 15# 2 x 10  Suitcase carry 7# x 75' in R hand Lumbar flexion with Green P-ball for gentle stretch after manual therapy.  Manual Therapy: to decrease muscle spasm and pain and improve mobility STM/TPR to bil lumbar paraspinals, UPA mobs lumbar spine grade 1-2, skilled palpation and monitoring during dry needling.  Trigger Point Dry-Needling  Treatment instructions: Expect mild to moderate muscle soreness. S/S of pneumothorax if dry needled over a lung field, and to seek immediate medical attention should they occur. Patient verbalized understanding of these instructions and education.  Patient Consent Given: Yes Education handout provided: Yes Muscles treated: L2-4 bil lumbar multifidi Electrical stimulation performed: No Parameters: N/A Treatment response/outcome: Twitch Response Elicited and Palpable Increase in Muscle Length   09/08/21  Therapeutic Exercise: Nustep L4x59mn Seated piriformis stretch (modified pigeon) 2x10" R/L  Manual Therapy:  STM to B glutes, lumbar paraspinals, - sharp pain over L  QL  6/123 Therapeutic Exercises: TM 1.5 mph x 5 min Standing scap retraction with GTB 2x10 Standing shoulder extension GTB 2x10 Seated lumbar stretch 2x30 sec Standing ER with GTB against doorframe 10x Standing horizontal ABD against doorframe 2x10 Seated lat pulls 2x10 20# Cybex knee flexion 20# 2x10 Cybex knee extension 10# 2x10  08/25/21: Therapeutic Exercise: to improve strength and mobility.  Verbal and tactile cues throughout for technique. -TM 1.5 mph x 5 min -seated flexion with green p-ball x 10; R and L 2x20 sec ea -functional squat to mat with blue foam pad  x 15 bil -lunges with counter x 10 bil  -bridge x 10, with green band x 10, bridge with clam GTB x 10 - reviewed ab set vs. Pelvic tilt. Ab set with march. -prone quad stretch with strap 2x30 sec ea -prone hip ext x 10 ea     PATIENT EDUCATION:  Education details: risks and benefits of dry needling and aftercare Person educated: Patient Education method: Explanation and Handouts Education comprehension: verbalized understanding   HOME EXERCISE PROGRAM: Access Code: [email or web address]  ASSESSMENT:  CLINICAL IMPRESSION: LRylee HuestisDishon reports continued low back pain and spasms.  She tolerated progression of core strengthening without increased pain.  Noted trigger points in lumbar spine today with manual therapy, discussed and educated on dry needling as an option, she consented to trial and reported decreased tightness following.  Advised to use CP to low back if has soreness following.  She would benefit from continued skilled therapy.   OBJECTIVE IMPAIRMENTS Abnormal gait, decreased activity tolerance, decreased endurance, decreased mobility, difficulty walking, decreased ROM, decreased strength, increased muscle spasms, impaired flexibility, impaired sensation, improper body mechanics, postural dysfunction, and pain.   ACTIVITY LIMITATIONS cleaning, community activity, meal prep, occupation, laundry, yard work,  shopping, and sleeping .   PERSONAL FACTORS Fitness and 3+ comorbidities: DM, obesity, HTN, GERD, chronic pain  are also affecting patient's functional outcome.    REHAB POTENTIAL: Good  CLINICAL DECISION MAKING: Evolving/moderate complexity  EVALUATION COMPLEXITY: Moderate   GOALS: Goals reviewed with patient? Yes  SHORT TERM GOALS: Target date: 08/27/2021     Ind with initial HEP Baseline: Goal status: MET   LONG TERM GOALS: Target date: 09/24/2021    Ind with progressed HEP to improve carryover Baseline:  Goal status: IN PROGRESS  2.  Report 75% improvement in low back pain at end of work day.  Baseline: 8-9.10 end of day; reports 40% improvement (08/25/21) Goal status: IN PROGRESS  3.  Report 75% decrease in sleep disruption due to LBP.  Baseline: difficulty falling asleep, frequent wakes for 1+hour; wakes less often from back pain Goal status: IN PROGRESS  4.  Improve FOTO to 55% to demonstrate improved function.  Baseline: 38% Goal status: IN PROGRESS   PLAN: PT FREQUENCY: 2x/week  PT DURATION: 6 weeks  PLANNED INTERVENTIONS: Therapeutic exercises, Therapeutic activity, Neuromuscular re-education, Balance training, Gait training, Patient/Family education, Joint mobilization, Stair training, Dry Needling, Spinal mobilization, Cryotherapy, Moist heat, Traction, Ultrasound, and Manual therapy.  PLAN FOR NEXT SESSION: Continue lumbar stabilization, neutral spine exercises, hip strength,  manual therapy/modalities PRN   ERennie Natter PT, DPT  09/11/2021, 9:07 AM

## 2021-09-11 NOTE — Patient Instructions (Signed)

## 2021-09-14 ENCOUNTER — Encounter: Payer: Self-pay | Admitting: Physical Therapy

## 2021-09-14 ENCOUNTER — Ambulatory Visit: Payer: 59 | Admitting: Physical Therapy

## 2021-09-14 DIAGNOSIS — M6281 Muscle weakness (generalized): Secondary | ICD-10-CM

## 2021-09-14 DIAGNOSIS — M5459 Other low back pain: Secondary | ICD-10-CM

## 2021-09-14 DIAGNOSIS — R293 Abnormal posture: Secondary | ICD-10-CM

## 2021-09-14 DIAGNOSIS — M62830 Muscle spasm of back: Secondary | ICD-10-CM

## 2021-09-14 NOTE — Therapy (Signed)
OUTPATIENT PHYSICAL THERAPY TREATMENT NOTE  Patient Name: Margaret Carey MRN: [id number] DOB:1960/12/17, 61 y.o., female Today's Date: 09/14/2021  Rationale for Evaluation and Treatment Rehabilitation    PT End of Session - 09/14/21 1619     Visit Number 8    Number of Visits 12    Date for PT Re-Evaluation 09/30/21    Authorization Type [employer] [id number]    Progress Note Due on Visit 10    PT Start Time 1617    PT Stop Time 1700    PT Time Calculation (min) 43 min    Activity Tolerance Patient tolerated treatment well    Behavior During Therapy [facility] for tasks assessed/performed                 Past Medical History:  Diagnosis Date   Allergy    Arthritis    Chronic kidney disease    kidney failure 02/2018   Diabetes mellitus    GERD (gastroesophageal reflux disease)    Hypertension    Obese    Sleep apnea    no cpap   Thyroid disease    Hypothyroid   Past Surgical History:  Procedure Laterality Date   ABDOMINAL HYSTERECTOMY  2011   TAH RSO.teratoma/dr [place]     HIP SURGERY     Bilateral replacement   IRRIGATION AND DEBRIDEMENT ABSCESS N/A 01/22/2018   Procedure: IRRIGATION AND DEBRIDEMENT BACK ABSCESS;  Surgeon: Excell Seltzer, MD;  Location: [facility];  Service: General;  Laterality: N/A;   OOPHORECTOMY     LSO and RSO   Patient Active Problem List   Diagnosis Date Noted   Abscess of back 01/22/2018   Severe sepsis with septic shock ([place]) 01/22/2018   Sepsis ([place]) 01/21/2018   AKI (acute kidney injury) ([place]) 01/21/2018   Hyponatremia 01/21/2018   DM (diabetes mellitus) ([place]) 01/21/2018   Thyroid disease    Hypertension     PCP: Shon Baton, MD  REFERRING PROVIDER: Ladonna Snide, MD  REFERRING DIAG: M54.16 Lumbar Radiculopathy  THERAPY DIAG:  Other low back pain  Abnormal posture  Muscle weakness (generalized)  Muscle spasm of back  ONSET DATE: Dec 2023  SUBJECTIVE:                                                                                                                                                                                            SUBJECTIVE STATEMENT: Patient reported some soreness following DN, but ok now.  Didn't work today.     PERTINENT HISTORY:  History of bil THR (L  revised), abdominal hysterectomy, HTN, diabetes, kidney disease, GERD, thyroid disease, OA, smoker  PAIN:  Are you having pain? No: NPRS scale: 3/10  Pain location: mid back Pain description: ache   PRECAUTIONS: None   PATIENT GOALS: don't want to deal with this, don't want to be scared because I'm afraid I'm going to end up paralyzed.    OBJECTIVE:  from initial evaluation unless dated  DIAGNOSTIC FINDINGS:  MRI lumbar - multilevel degenerative changes, severe bilateral neural foraminal narrowing L5-S1, moderated bil neural foraminal narrowing L2-L3 and L3-L4.  Mild dextroconvex scoliosis, apex at L2  PATIENT SURVEYS:  FOTO 38%.  Predicted outcome 55% after 12 visits  SENSATION: "Skin feels plastic on left side " along L L4/5 dermatomes  MUSCLE LENGTH: Hamstrings: Right 75 deg; Left 45 deg limited by pain   Prone knee bend: Right 90 deg; Left 90 deg  POSTURE:  Forward flexed posture, lumbar scoliosis L shoulder higher than R, decreased lumbar lordosis, forward head posture.   PALPATION: Tenderness throughout lumbar paraspinals, L1-L5 with PA mobs, hypomobile, bil SIJ L>R, L glutes/piriformis.    LUMBAR ROM:   Active  A/PROM  08/13/2021  Flexion 50% limitation  Extension 75% limited  Right lateral flexion Slight better than R but increased pain  Left lateral flexion Mid thigh pain  Right rotation Inc pain limited 50%  Left rotation Inc pain limited 75% L   (Blank rows = not tested)  LE ROM:  Passive  Right 08/13/2021 Left 08/13/2021  Hip flexion    Hip extension    Hip abduction    Hip adduction    Hip internal rotation Very limited < 10 deg  Very  limited <10 deg   Hip external rotation  Decreased 50% compared to R  Knee flexion    Knee extension     (Blank rows = not tested)  LE MMT:  MMT Right 08/25/2021 Left 08/25/2021  Hip flexion 5 4* revised THR  Hip extension    Hip abduction 5 5  Hip adduction 5 5  Hip internal rotation    Hip external rotation    Knee flexion 5 5  Knee extension 5 5  Ankle dorsiflexion 5 5  Ankle plantarflexion 5 5   (Blank rows = not tested)  LUMBAR SPECIAL TESTS:  Straight leg raise test: Positive left  FUNCTIONAL TESTS:  5 times sit to stand: 16.5 sec without UE assist    TODAY'S TREATMENT  09/14/2021 Therapeutic Exercise: to improve strength and mobility.  Demo, verbal and tactile cues throughout for technique.  Nustep L5 x 6 min   Paloff press 5# 2 x 10 bil   Standing lat pull 15# 2 x 10   Flexion stretchs at sink, QL stretch at sink  Side glides at wall x 5 bil  Cat cow x 5  Child pose stretch with SB 2 x 20 sec hold.  Manual Therapy: to decrease muscle spasm and pain and improve mobility IASTM with foam roller to bil glutes and proximal hamstrings, STM and TPR to lumbar paraspinals (focusing on R sides today), MFR bil QL, UPA mobs lumbar spine grade 1-2.   09/10/2021 Therapeutic Exercise: to improve strength and mobility.  Demo, verbal and tactile cues throughout for technique. Bike L1 x 5 min Lat pull downs standing 15# 2 x 10 Paloff press 10# x 10 bil, 5# x 10 bil Cybex rows 15# 2 x 10  Suitcase carry 7# x 75' in R hand Lumbar flexion with Nyoka Cowden P-ball for  gentle stretch after manual therapy.  Manual Therapy: to decrease muscle spasm and pain and improve mobility STM/TPR to bil lumbar paraspinals, UPA mobs lumbar spine grade 1-2, skilled palpation and monitoring during dry needling.  Trigger Point Dry-Needling  Treatment instructions: Expect mild to moderate muscle soreness. S/S of pneumothorax if dry needled over a lung field, and to seek immediate medical attention should  they occur. Patient verbalized understanding of these instructions and education.  Patient Consent Given: Yes Education handout provided: Yes Muscles treated: L2-4 bil lumbar multifidi Electrical stimulation performed: No Parameters: N/A Treatment response/outcome: Twitch Response Elicited and Palpable Increase in Muscle Length   09/08/21 Therapeutic Exercise: Nustep L4x77min Seated piriformis stretch (modified pigeon) 2x10" R/L  Manual Therapy:  STM to B glutes, lumbar paraspinals, - sharp pain over L QL   PATIENT EDUCATION:  Education details: risks and benefits of dry needling and aftercare Person educated: Patient Education method: Explanation and Handouts Education comprehension: verbalized understanding   HOME EXERCISE PROGRAM: Access Code: [email or web address]   ASSESSMENT:  CLINICAL IMPRESSION: Lakisa Lotz Passon reports continued LBP but did not need medication today.  She tolerated gentle core strengthening and stretches today, followed by manual therapy to decrease muscle spasm and pain.  Added stretches to HEP that tolerated well today.     OBJECTIVE IMPAIRMENTS Abnormal gait, decreased activity tolerance, decreased endurance, decreased mobility, difficulty walking, decreased ROM, decreased strength, increased muscle spasms, impaired flexibility, impaired sensation, improper body mechanics, postural dysfunction, and pain.   ACTIVITY LIMITATIONS cleaning, community activity, meal prep, occupation, laundry, yard work, shopping, and sleeping .   PERSONAL FACTORS Fitness and 3+ comorbidities: DM, obesity, HTN, GERD, chronic pain  are also affecting patient's functional outcome.    REHAB POTENTIAL: Good  CLINICAL DECISION MAKING: Evolving/moderate complexity  EVALUATION COMPLEXITY: Moderate   GOALS: Goals reviewed with patient? Yes  SHORT TERM GOALS: Target date: 08/27/2021     Ind with initial HEP Baseline: Goal status: MET   LONG TERM GOALS: Target date: 09/24/2021     Ind with progressed HEP to improve carryover Baseline:  Goal status: IN PROGRESS  2.  Report 75% improvement in low back pain at end of work day.  Baseline: 8-9.10 end of day; reports 40% improvement (08/25/21) Goal status: IN PROGRESS  3.  Report 75% decrease in sleep disruption due to LBP.  Baseline: difficulty falling asleep, frequent wakes for 1+hour; wakes less often from back pain Goal status: IN PROGRESS  4.  Improve FOTO to 55% to demonstrate improved function.  Baseline: 38% Goal status: IN PROGRESS   PLAN: PT FREQUENCY: 2x/week  PT DURATION: 6 weeks  PLANNED INTERVENTIONS: Therapeutic exercises, Therapeutic activity, Neuromuscular re-education, Balance training, Gait training, Patient/Family education, Joint mobilization, Stair training, Dry Needling, Spinal mobilization, Cryotherapy, Moist heat, Traction, Ultrasound, and Manual therapy.  PLAN FOR NEXT SESSION: Continue lumbar stabilization, neutral spine exercises, hip strength,  manual therapy/modalities PRN   Rennie Natter, PT, DPT  09/14/2021, 5:08 PM

## 2021-09-15 NOTE — Therapy (Signed)
OUTPATIENT PHYSICAL THERAPY TREATMENT NOTE  Patient Name: Margaret Carey MRN: [id number] DOB:06-29-1960, 61 y.o., female Today's Date: 09/16/2021  Rationale for Evaluation and Treatment Rehabilitation    PT End of Session - 09/16/21 0756     Visit Number 9    Number of Visits 12    Date for PT Re-Evaluation 09/30/21    Authorization Type [employer] VL [id number]    Progress Note Due on Visit --    PT Start Time 0800    PT Stop Time 0845    PT Time Calculation (min) 45 min    Activity Tolerance Patient tolerated treatment well    Behavior During Therapy [facility] for tasks assessed/performed                  Past Medical History:  Diagnosis Date   Allergy    Arthritis    Chronic kidney disease    kidney failure 02/2018   Diabetes mellitus    GERD (gastroesophageal reflux disease)    Hypertension    Obese    Sleep apnea    no cpap   Thyroid disease    Hypothyroid   Past Surgical History:  Procedure Laterality Date   ABDOMINAL HYSTERECTOMY  2011   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   CERVICAL [place] BIOPSY  1990   HERNIA REPAIR     HIP SURGERY     Bilateral replacement   IRRIGATION AND DEBRIDEMENT ABSCESS N/A 01/22/2018   Procedure: IRRIGATION AND DEBRIDEMENT BACK ABSCESS;  Surgeon: Excell Seltzer, MD;  Location: [facility];  Service: General;  Laterality: N/A;   OOPHORECTOMY     LSO and RSO   Patient Active Problem List   Diagnosis Date Noted   Abscess of back 01/22/2018   Severe sepsis with septic shock ([place]) 01/22/2018   Sepsis ([place]) 01/21/2018   AKI (acute kidney injury) ([place]) 01/21/2018   Hyponatremia 01/21/2018   DM (diabetes mellitus) ([place]) 01/21/2018   Thyroid disease    Hypertension     PCP: Shon Baton, MD  REFERRING PROVIDER: Ladonna Snide, MD  REFERRING DIAG: M54.16 Lumbar Radiculopathy  THERAPY DIAG:  Other low back pain  Abnormal posture  Muscle weakness (generalized)  Muscle spasm of back  ONSET DATE: Dec 2023  SUBJECTIVE:                                                                                                                                                                                            SUBJECTIVE STATEMENT: Patient reports 3/10 pain since yesterday. Had to take meds last night. Right side is very tight. I did stretches this morning.  PERTINENT HISTORY:  History of bil THR (L revised), abdominal hysterectomy, HTN, diabetes, kidney disease, GERD, thyroid disease, OA, smoker  PAIN:  Are you having pain? yes : NPRS scale: 3/10  Pain location: right lumbar Pain description: ache   PRECAUTIONS: None   PATIENT GOALS: don't want to deal with this, don't want to be scared because I'm afraid I'm going to end up paralyzed.    OBJECTIVE:  from initial evaluation unless dated  DIAGNOSTIC FINDINGS:  MRI lumbar - multilevel degenerative changes, severe bilateral neural foraminal narrowing L5-S1, moderated bil neural foraminal narrowing L2-L3 and L3-L4.  Mild dextroconvex scoliosis, apex at L2  PATIENT SURVEYS:  FOTO 38%.  Predicted outcome 55% after 12 visits  SENSATION: "Skin feels plastic on left side " along L L4/5 dermatomes  MUSCLE LENGTH: Hamstrings: Right 75 deg; Left 45 deg limited by pain   Prone knee bend: Right 90 deg; Left 90 deg  POSTURE:  Forward flexed posture, lumbar scoliosis L shoulder higher than R, decreased lumbar lordosis, forward head posture.   PALPATION: Tenderness throughout lumbar paraspinals, L1-L5 with PA mobs, hypomobile, bil SIJ L>R, L glutes/piriformis.    LUMBAR ROM:   Active  A/PROM  08/13/2021  Flexion 50% limitation  Extension 75% limited  Right lateral flexion Slight better than R but increased pain  Left lateral flexion Mid thigh pain  Right rotation Inc pain limited 50%  Left rotation Inc pain limited 75% L   (Blank rows = not tested)  LE ROM:  Passive  Right 08/13/2021 Left 08/13/2021  Hip flexion    Hip extension    Hip abduction    Hip adduction     Hip internal rotation Very limited < 10 deg  Very limited <10 deg   Hip external rotation  Decreased 50% compared to R  Knee flexion    Knee extension     (Blank rows = not tested)  LE MMT:  MMT Right 08/25/2021 Left 08/25/2021  Hip flexion 5 4* revised THR  Hip extension    Hip abduction 5 5  Hip adduction 5 5  Hip internal rotation    Hip external rotation    Knee flexion 5 5  Knee extension 5 5  Ankle dorsiflexion 5 5  Ankle plantarflexion 5 5   (Blank rows = not tested)  LUMBAR SPECIAL TESTS:  Straight leg raise test: Positive left  FUNCTIONAL TESTS:  5 times sit to stand: 16.5 sec without UE assist    TODAY'S TREATMENT   09/16/21 Therex:  Nustep L5 x 6 min  Supine quad/HF stretch with strap R 3x 30 sec Seated HF stretch x 30 sec R Standing HF stretch with left foot on 8 inch step x 30 sec (for pool option) Paloff press GTB 2 x 10 bil Lat pull GTB 2x 10 in top of door  Manual: STM to right lumbar and QL in prone UPA mobs to bil lumbar to decrease tissue tension TPR to right QL in left SDLY Supine TPR to R hip flexor   09/14/2021 Therapeutic Exercise: to improve strength and mobility.  Demo, verbal and tactile cues throughout for technique.  Nustep L5 x 6 min   Paloff press 5# 2 x 10 bil   Standing lat pull 15# 2 x 10   Flexion stretchs at sink, QL stretch at sink  Side glides at wall x 5 bil  Cat cow x 5  Child pose stretch with SB 2 x 20 sec hold.  Manual Therapy: to  decrease muscle spasm and pain and improve mobility IASTM with foam roller to bil glutes and proximal hamstrings, STM and TPR to lumbar paraspinals (focusing on R sides today), MFR bil QL, UPA mobs lumbar spine grade 1-2.   09/10/2021 Therapeutic Exercise: to improve strength and mobility.  Demo, verbal and tactile cues throughout for technique. Bike L1 x 5 min Lat pull downs standing 15# 2 x 10 Paloff press 10# x 10 bil, 5# x 10 bil Cybex rows 15# 2 x 10  Suitcase carry 7# x 75' in R  hand Lumbar flexion with [place] P-ball for gentle stretch after manual therapy.  Manual Therapy: to decrease muscle spasm and pain and improve mobility STM/TPR to bil lumbar paraspinals, UPA mobs lumbar spine grade 1-2, skilled palpation and monitoring during dry needling.  Trigger Point Dry-Needling  Treatment instructions: Expect mild to moderate muscle soreness. S/S of pneumothorax if dry needled over a lung field, and to seek immediate medical attention should they occur. Patient verbalized understanding of these instructions and education.  Patient Consent Given: Yes Education handout provided: Yes Muscles treated: L2-4 bil lumbar multifidi Electrical stimulation performed: No Parameters: N/A Treatment response/outcome: Twitch Response Elicited and Palpable Increase in Muscle Length   PATIENT EDUCATION:  Education details: HEP updated 09/16/21 and reviewed. Patient has multiple stretches for the same muscles so advised to pick the ones that are most effective and/or mix them up if she prefers. Person educated: Patient Education method: Explanation and Handouts Education comprehension: verbalized understanding   HOME EXERCISE PROGRAM: Access Code: [email or web address]   ASSESSMENT:  CLINICAL IMPRESSION: Mykaela [place] presented today with increased soreness in R low back today. She is very tight in her right hip flexor and lumbar spine so manual therapy and stretches focused on this today.  Options given for work, home and pool. HEP reviewed and updated. Caily would like to try elliptical next visit to see if she can tolerate it.  OBJECTIVE IMPAIRMENTS Abnormal gait, decreased activity tolerance, decreased endurance, decreased mobility, difficulty walking, decreased ROM, decreased strength, increased muscle spasms, impaired flexibility, impaired sensation, improper body mechanics, postural dysfunction, and pain.   ACTIVITY LIMITATIONS cleaning, community activity, meal prep, occupation,  laundry, yard work, shopping, and sleeping .   PERSONAL FACTORS Fitness and 3+ comorbidities: DM, obesity, HTN, GERD, chronic pain  are also affecting patient's functional outcome.     GOALS: Goals reviewed with patient? Yes  SHORT TERM GOALS: Target date: 08/27/2021     Ind with initial HEP Baseline: Goal status: MET   LONG TERM GOALS: Target date: 09/24/2021    Ind with progressed HEP to improve carryover Baseline:  Goal status: IN PROGRESS  2.  Report 75% improvement in low back pain at end of work day.  Baseline: 8-9.10 end of day; reports 40% improvement (08/25/21) Goal status: IN PROGRESS  3.  Report 75% decrease in sleep disruption due to LBP.  Baseline: difficulty falling asleep, frequent wakes for 1+hour; wakes less often from back pain Goal status: IN PROGRESS  4.  Improve FOTO to 55% to demonstrate improved function.  Baseline: 38% Goal status: IN PROGRESS   PLAN: PT FREQUENCY: 2x/week  PT DURATION: 6 weeks  PLANNED INTERVENTIONS: Therapeutic exercises, Therapeutic activity, Neuromuscular re-education, Balance training, Gait training, Patient/Family education, Joint mobilization, Stair training, Dry Needling, Spinal mobilization, Cryotherapy, Moist heat, Traction, Ultrasound, and Manual therapy.  PLAN FOR NEXT SESSION: Pt would like to try elliptical again,  Continue lumbar stabilization, neutral spine exercises, hip strength,  manual  therapy/modalities PRN   RIDDLES,JULIE, PT, DPT  09/16/2021, 8:59 AM

## 2021-09-16 ENCOUNTER — Encounter: Payer: Self-pay | Admitting: Physical Therapy

## 2021-09-16 ENCOUNTER — Ambulatory Visit: Payer: 59 | Admitting: Physical Therapy

## 2021-09-16 DIAGNOSIS — M5459 Other low back pain: Secondary | ICD-10-CM | POA: Diagnosis not present

## 2021-09-16 DIAGNOSIS — M6281 Muscle weakness (generalized): Secondary | ICD-10-CM

## 2021-09-16 DIAGNOSIS — R293 Abnormal posture: Secondary | ICD-10-CM

## 2021-09-16 DIAGNOSIS — M62830 Muscle spasm of back: Secondary | ICD-10-CM

## 2021-09-21 ENCOUNTER — Ambulatory Visit: Payer: 59

## 2021-09-21 DIAGNOSIS — M6281 Muscle weakness (generalized): Secondary | ICD-10-CM

## 2021-09-21 DIAGNOSIS — M62830 Muscle spasm of back: Secondary | ICD-10-CM

## 2021-09-21 DIAGNOSIS — M5459 Other low back pain: Secondary | ICD-10-CM | POA: Diagnosis not present

## 2021-09-21 DIAGNOSIS — R293 Abnormal posture: Secondary | ICD-10-CM

## 2021-09-21 NOTE — Therapy (Signed)
OUTPATIENT PHYSICAL THERAPY TREATMENT NOTE  Patient Name: Margaret Carey MRN: [id number] DOB:10/13/1960, 61 y.o., female Today's Date: 09/21/2021  Rationale for Evaluation and Treatment Rehabilitation    PT End of Session - 09/21/21 0827     Visit Number 10    Number of Visits 12    Date for PT Re-Evaluation 09/30/21    Authorization Type [employer] [id number]    PT Start Time 0800    PT Stop Time 0845    PT Time Calculation (min) 45 min    Activity Tolerance Patient tolerated treatment well    Behavior During Therapy [facility] for tasks assessed/performed                   Past Medical History:  Diagnosis Date   Allergy    Arthritis    Chronic kidney disease    kidney failure 02/2018   Diabetes mellitus    GERD (gastroesophageal reflux disease)    Hypertension    Obese    Sleep apnea    no cpap   Thyroid disease    Hypothyroid   Past Surgical History:  Procedure Laterality Date   ABDOMINAL HYSTERECTOMY  2011   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   CERVICAL CONE BIOPSY  1990   HERNIA REPAIR     HIP SURGERY     Bilateral replacement   IRRIGATION AND DEBRIDEMENT ABSCESS N/A 01/22/2018   Procedure: IRRIGATION AND DEBRIDEMENT BACK ABSCESS;  Surgeon: Excell Seltzer, MD;  Location: [facility];  Service: General;  Laterality: N/A;   OOPHORECTOMY     LSO and RSO   Patient Active Problem List   Diagnosis Date Noted   Abscess of back 01/22/2018   Severe sepsis with septic shock ([place]) 01/22/2018   Sepsis ([place]) 01/21/2018   AKI (acute kidney injury) ([place]) 01/21/2018   Hyponatremia 01/21/2018   DM (diabetes mellitus) ([place]) 01/21/2018   Thyroid disease    Hypertension     PCP: Shon Baton, MD  REFERRING PROVIDER: Ladonna Snide, MD  REFERRING DIAG: M54.16 Lumbar Radiculopathy  THERAPY DIAG:  Other low back pain  Abnormal posture  Muscle weakness (generalized)  Muscle spasm of back  ONSET DATE: Dec 2023  SUBJECTIVE:                                                                                                                                                                                            SUBJECTIVE STATEMENT: Pt reports stiffness in back today but was able to do some exercises in the pool.   PERTINENT HISTORY:  History of bil THR (L revised), abdominal hysterectomy, HTN, diabetes, kidney  disease, GERD, thyroid disease, OA, smoker  PAIN:  Are you having pain? yes : NPRS scale: 1-2/10  Pain location: right lumbar Pain description: ache   PRECAUTIONS: None   PATIENT GOALS: don't want to deal with this, don't want to be scared because I'm afraid I'm going to end up paralyzed.    OBJECTIVE:  from initial evaluation unless dated  DIAGNOSTIC FINDINGS:  MRI lumbar - multilevel degenerative changes, severe bilateral neural foraminal narrowing L5-S1, moderated bil neural foraminal narrowing L2-L3 and L3-L4.  Mild dextroconvex scoliosis, apex at L2  PATIENT SURVEYS:  FOTO 38%.  Predicted outcome 55% after 12 visits  SENSATION: "Skin feels plastic on left side " along L L4/5 dermatomes  MUSCLE LENGTH: Hamstrings: Right 75 deg; Left 45 deg limited by pain   Prone knee bend: Right 90 deg; Left 90 deg  POSTURE:  Forward flexed posture, lumbar scoliosis L shoulder higher than R, decreased lumbar lordosis, forward head posture.   PALPATION: Tenderness throughout lumbar paraspinals, L1-L5 with PA mobs, hypomobile, bil SIJ L>R, L glutes/piriformis.    LUMBAR ROM:   Active  A/PROM  08/13/2021  Flexion 50% limitation  Extension 75% limited  Right lateral flexion Slight better than R but increased pain  Left lateral flexion Mid thigh pain  Right rotation Inc pain limited 50%  Left rotation Inc pain limited 75% L   (Blank rows = not tested)  LE ROM:  Passive  Right 08/13/2021 Left 08/13/2021  Hip flexion    Hip extension    Hip abduction    Hip adduction    Hip internal rotation Very limited < 10 deg  Very limited <10 deg   Hip  external rotation  Decreased 50% compared to R  Knee flexion    Knee extension     (Blank rows = not tested)  LE MMT:  MMT Right 08/25/2021 Left 08/25/2021  Hip flexion 5 4* revised THR  Hip extension    Hip abduction 5 5  Hip adduction 5 5  Hip internal rotation    Hip external rotation    Knee flexion 5 5  Knee extension 5 5  Ankle dorsiflexion 5 5  Ankle plantarflexion 5 5   (Blank rows = not tested)  LUMBAR SPECIAL TESTS:  Straight leg raise test: Positive left  FUNCTIONAL TESTS:  5 times sit to stand: 16.5 sec without UE assist    TODAY'S TREATMENT  09/21/21 Therapeutic Exercise: Nustep L5x10mn Cybex knee extension 15# 2x10 bil Cybex knee flexion 25# 2x10 bil Cybex row 25# 2x10 Lat pull in standing 15# 2x10 Multifidus walkout with blue TB 10x each side Pallof press blue TB 10x bil  Manual therapy: Manual lumbar decompression in supine Instruction given on ways to do lumbar traction at home  09/16/21 Therex:  Nustep L5 x 6 min  Supine quad/HF stretch with strap R 3x 30 sec Seated HF stretch x 30 sec R Standing HF stretch with left foot on 8 inch step x 30 sec (for pool option) Paloff press GTB 2 x 10 bil Lat pull GTB 2x 10 in top of door  Manual: STM to right lumbar and QL in prone UPA mobs to bil lumbar to decrease tissue tension TPR to right QL in left SDLY Supine TPR to R hip flexor   09/14/2021 Therapeutic Exercise: to improve strength and mobility.  Demo, verbal and tactile cues throughout for technique.  Nustep L5 x 6 min   Paloff press 5# 2 x 10 bil  Standing lat pull 15# 2 x 10   Flexion stretchs at sink, QL stretch at sink  Side glides at wall x 5 bil  Cat cow x 5  Child pose stretch with SB 2 x 20 sec hold.  Manual Therapy: to decrease muscle spasm and pain and improve mobility IASTM with foam roller to bil glutes and proximal hamstrings, STM and TPR to lumbar paraspinals (focusing on R sides today), MFR bil QL, UPA mobs lumbar spine  grade 1-2.    PATIENT EDUCATION:  Education details: HEP update - multifidus walkout Person educated: Patient Education method: Explanation, Demonstration, and Handouts Education comprehension: verbalized understanding and returned demonstration   HOME EXERCISE PROGRAM: Access Code: [email or web address]   ASSESSMENT:  CLINICAL IMPRESSION: Pt had made progress with overall lower back pain levels. She reported 40% improvement in pain at night, pain after a day of work is now 6/10. Continued to work on therapeutic exercise to promote better postural alignment, core strength and LE strength to improve lumbar support. Pt had a good response to manual spine decompression today and we reviewed ways to achieve this at home. She has made progress toward goals, based on pain levels would continue to benefit from PT however she feels that she will be ready to transition to HEP by the end of her POC.   OBJECTIVE IMPAIRMENTS Abnormal gait, decreased activity tolerance, decreased endurance, decreased mobility, difficulty walking, decreased ROM, decreased strength, increased muscle spasms, impaired flexibility, impaired sensation, improper body mechanics, postural dysfunction, and pain.   ACTIVITY LIMITATIONS cleaning, community activity, meal prep, occupation, laundry, yard work, shopping, and sleeping .   PERSONAL FACTORS Fitness and 3+ comorbidities: DM, obesity, HTN, GERD, chronic pain  are also affecting patient's functional outcome.     GOALS: Goals reviewed with patient? Yes  SHORT TERM GOALS: Target date: 08/27/2021     Ind with initial HEP Baseline: Goal status: MET   LONG TERM GOALS: Target date: 09/24/2021    Ind with progressed HEP to improve carryover Baseline:  Goal status: IN PROGRESS  2.  Report 75% improvement in low back pain at end of work day.  Baseline: 8-9.10 end of day; reports 40% improvement (08/25/21) Goal status: IN PROGRESS (6/10 now, but still stiff in low back -  09/21/21)  3.  Report 75% decrease in sleep disruption due to LBP.  Baseline: difficulty falling asleep, frequent wakes for 1+hour; wakes less often from back pain Goal status: IN PROGRESS (40% improvement in sleep disruption)  4.  Improve FOTO to 55% to demonstrate improved function.  Baseline: 38% Goal status: IN PROGRESS   PLAN: PT FREQUENCY: 2x/week  PT DURATION: 6 weeks  PLANNED INTERVENTIONS: Therapeutic exercises, Therapeutic activity, Neuromuscular re-education, Balance training, Gait training, Patient/Family education, Joint mobilization, Stair training, Dry Needling, Spinal mobilization, Cryotherapy, Moist heat, Traction, Ultrasound, and Manual therapy.  PLAN FOR NEXT SESSION: Pt would like to try elliptical again,  Continue lumbar stabilization, neutral spine exercises, hip strength,  manual therapy/modalities PRN   Artist Pais, PTA 09/21/2021, 8:49 AM

## 2021-09-24 ENCOUNTER — Encounter: Payer: Self-pay | Admitting: Physical Therapy

## 2021-09-24 ENCOUNTER — Ambulatory Visit: Payer: 59 | Admitting: Physical Therapy

## 2021-09-24 DIAGNOSIS — M62830 Muscle spasm of back: Secondary | ICD-10-CM

## 2021-09-24 DIAGNOSIS — R293 Abnormal posture: Secondary | ICD-10-CM

## 2021-09-24 DIAGNOSIS — M6281 Muscle weakness (generalized): Secondary | ICD-10-CM

## 2021-09-24 DIAGNOSIS — M5459 Other low back pain: Secondary | ICD-10-CM | POA: Diagnosis not present

## 2021-09-24 NOTE — Therapy (Signed)
OUTPATIENT PHYSICAL THERAPY TREATMENT NOTE  Patient Name: Margaret Carey MRN: [id number] DOB:04/27/1960, 61 y.o., female Today's Date: 09/24/2021  Rationale for Evaluation and Treatment Rehabilitation    PT End of Session - 09/24/21 0804     Visit Number 11    Number of Visits 12    Date for PT Re-Evaluation 09/30/21    Authorization Type [employer] VL [id number]    PT Start Time 0801    PT Stop Time 1740    PT Time Calculation (min) 42 min    Activity Tolerance Patient tolerated treatment well    Behavior During Therapy [facility] for tasks assessed/performed                   Past Medical History:  Diagnosis Date   Allergy    Arthritis    Chronic kidney disease    kidney failure 02/2018   Diabetes mellitus    GERD (gastroesophageal reflux disease)    Hypertension    Obese    Sleep apnea    no cpap   Thyroid disease    Hypothyroid   Past Surgical History:  Procedure Laterality Date   ABDOMINAL HYSTERECTOMY  2011   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   CERVICAL CONE BIOPSY  1990   HERNIA REPAIR     HIP SURGERY     Bilateral replacement   IRRIGATION AND DEBRIDEMENT ABSCESS N/A 01/22/2018   Procedure: IRRIGATION AND DEBRIDEMENT BACK ABSCESS;  Surgeon: Excell Seltzer, MD;  Location: [facility];  Service: General;  Laterality: N/A;   OOPHORECTOMY     LSO and RSO   Patient Active Problem List   Diagnosis Date Noted   Abscess of back 01/22/2018   Severe sepsis with septic shock ([place]) 01/22/2018   Sepsis ([place]) 01/21/2018   AKI (acute kidney injury) ([place]) 01/21/2018   Hyponatremia 01/21/2018   DM (diabetes mellitus) ([place]) 01/21/2018   Thyroid disease    Hypertension     PCP: Shon Baton, MD  REFERRING PROVIDER: Ladonna Snide, MD  REFERRING DIAG: M54.16 Lumbar Radiculopathy  THERAPY DIAG:  Other low back pain  Abnormal posture  Muscle weakness (generalized)  Muscle spasm of back  ONSET DATE: Dec 2023  SUBJECTIVE:                                                                                                                                                                                            SUBJECTIVE STATEMENT: Pt. Reports stiff today, weather not helping.  Still has to go to work today.    PERTINENT HISTORY:  History of bil THR (L revised), abdominal hysterectomy, HTN, diabetes, kidney  disease, GERD, thyroid disease, OA, smoker  PAIN:  Are you having pain? yes : NPRS scale: 2/10  Pain location: right lumbar Pain description: ache   PRECAUTIONS: None   PATIENT GOALS: don't want to deal with this, don't want to be scared because I'm afraid I'm going to end up paralyzed.    OBJECTIVE:  from initial evaluation unless dated  DIAGNOSTIC FINDINGS:  MRI lumbar - multilevel degenerative changes, severe bilateral neural foraminal narrowing L5-S1, moderated bil neural foraminal narrowing L2-L3 and L3-L4.  Mild dextroconvex scoliosis, apex at L2  PATIENT SURVEYS:  FOTO 38%.  Predicted outcome 55% after 12 visits  SENSATION: "Skin feels plastic on left side " along L L4/5 dermatomes  MUSCLE LENGTH: Hamstrings: Right 75 deg; Left 45 deg limited by pain   Prone knee bend: Right 90 deg; Left 90 deg  POSTURE:  Forward flexed posture, lumbar scoliosis L shoulder higher than R, decreased lumbar lordosis, forward head posture.   PALPATION: Tenderness throughout lumbar paraspinals, L1-L5 with PA mobs, hypomobile, bil SIJ L>R, L glutes/piriformis.    LUMBAR ROM:   Active  A/PROM  08/13/2021  Flexion 50% limitation  Extension 75% limited  Right lateral flexion Slight better than R but increased pain  Left lateral flexion Mid thigh pain  Right rotation Inc pain limited 50%  Left rotation Inc pain limited 75% L   (Blank rows = not tested)  LE ROM:  Passive  Right 08/13/2021 Left 08/13/2021  Hip flexion    Hip extension    Hip abduction    Hip adduction    Hip internal rotation Very limited < 10 deg  Very limited <10 deg   Hip  external rotation  Decreased 50% compared to R  Knee flexion    Knee extension     (Blank rows = not tested)  LE MMT:  MMT Right 08/25/2021 Left 08/25/2021  Hip flexion 5 4* revised THR  Hip extension    Hip abduction 5 5  Hip adduction 5 5  Hip internal rotation    Hip external rotation    Knee flexion 5 5  Knee extension 5 5  Ankle dorsiflexion 5 5  Ankle plantarflexion 5 5   (Blank rows = not tested)  LUMBAR SPECIAL TESTS:  Straight leg raise test: Positive left  FUNCTIONAL TESTS:  5 times sit to stand: 16.5 sec without UE assist    TODAY'S TREATMENT  09/24/21 Therapeutic Exercise:  Nustep L6 x 6 min  Cybex knee extension 20# 2 x 10  Cybex knee flexion 20# 3 x 10  Cybex rows 25# 2 x 10  Wall push-ups x 10, at counter x 10 Review T-band exercises - rows, shoulder extension, paloff press and multifidi walkout At counter - exercises for aquatic therapy -heel raises, hip abduction, hip extension, hip flexion, lunges, side stepping, SLS, tandem walking. Reviewed and discussed other exercises currently performing and examples of other exercises added to HEP.    09/21/21 Therapeutic Exercise: Nustep L5x56min Cybex knee extension 15# 2x10 bil Cybex knee flexion 25# 2x10 bil Cybex row 25# 2x10 Lat pull in standing 15# 2x10 Multifidus walkout with blue TB 10x each side Pallof press blue TB 10x bil  Manual therapy: Manual lumbar decompression in supine Instruction given on ways to do lumbar traction at home  09/16/21 Therex:  Nustep L5 x 6 min  Supine quad/HF stretch with strap R 3x 30 sec Seated HF stretch x 30 sec R Standing HF stretch with left foot on 8  inch step x 30 sec (for pool option) Paloff press GTB 2 x 10 bil Lat pull GTB 2x 10 in top of door  Manual: STM to right lumbar and QL in prone UPA mobs to bil lumbar to decrease tissue tension TPR to right QL in left SDLY Supine TPR to R hip flexor   09/14/2021 Therapeutic Exercise: to improve strength and  mobility.  Demo, verbal and tactile cues throughout for technique.  Nustep L5 x 6 min   Paloff press 5# 2 x 10 bil   Standing lat pull 15# 2 x 10   Flexion stretchs at sink, QL stretch at sink  Side glides at wall x 5 bil  Cat cow x 5  Child pose stretch with SB 2 x 20 sec hold.  Manual Therapy: to decrease muscle spasm and pain and improve mobility IASTM with foam roller to bil glutes and proximal hamstrings, STM and TPR to lumbar paraspinals (focusing on R sides today), MFR bil QL, UPA mobs lumbar spine grade 1-2.    PATIENT EDUCATION:  Education details: HEP update - multifidus walkout Person educated: Patient Education method: Explanation, Demonstration, and Handouts Education comprehension: verbalized understanding and returned demonstration   HOME EXERCISE PROGRAM: Access Code: [email or web address]  Update 09/24/21 [id number] for aquatic exercises.   Exercises - Heel Raises with Counter Support  - 1 x daily - 7 x weekly - 3 sets - 10 reps - Standing March with Counter Support  - 1 x daily - 7 x weekly - 3 sets - 10 reps - Standing Hip Abduction with Counter Support  - 1 x daily - 7 x weekly - 3 sets - 10 reps - Standing Hip Extension with Counter Support  - 1 x daily - 7 x weekly - 3 sets - 10 reps - Standing Hip Flexion with Counter Support  - 1 x daily - 7 x weekly - 3 sets - 10 reps - Push Up on Table  - 1 x daily - 7 x weekly - 3 sets - 10 reps - Mini Squat with Counter Support  - 1 x daily - 7 x weekly - 3 sets - 10 reps - Standing Single Leg Stance with Counter Support  - 1 x daily - 7 x weekly - 3 sets - 10 reps - Standing Tandem Balance with Counter Support  - 1 x daily - 7 x weekly - 3 sets - 10 reps - Lunge with Counter Support  - 1 x daily - 7 x weekly - 3 sets - 10 reps - Side Lunge with Counter Support  - 1 x daily - 7 x weekly - 3 sets - 10 reps - Cat Cow in [place] with Pool Noodle  - 1 x daily - 7 x weekly - 3 sets - 10 reps - Chair Pose in [place] with Pool  Noodle  - 1 x daily - 7 x weekly - 3 sets - 10 reps - Forward and Backward Step Over in [place]  - 1 x daily - 7 x weekly - 3 sets - 10 reps - Side Step Over in [place]  - 1 x daily - 7 x weekly - 3 sets - 10 reps - Waist Circles  - 1 x daily - 7 x weekly - 3 sets - 10 reps - Overhead Lunges with Ball   - 1 x daily - 7 x weekly - 3 sets - 10 reps - Pelvic Tilt at [employer]  -  1 x daily - 7 x weekly - 3 sets - 10 reps - Piriformis Stretch at [employer]  - 1 x daily - 7 x weekly - 3 sets - 10 reps - Sports administrator with Noodle at [employer]  - 1 x daily - 7 x weekly - 3 sets - 10 reps   ASSESSMENT:  CLINICAL IMPRESSION: Focus of todays interventions was reviewing and progressing HEP with emphasis on exercises that could be performed in aquatic environment as she now has her pool open.  She tolerated exercises well without increased pain.  She is feeling confident about discharge next session to continue working on core strengthening at home.   OBJECTIVE IMPAIRMENTS Abnormal gait, decreased activity tolerance, decreased endurance, decreased mobility, difficulty walking, decreased ROM, decreased strength, increased muscle spasms, impaired flexibility, impaired sensation, improper body mechanics, postural dysfunction, and pain.   ACTIVITY LIMITATIONS cleaning, community activity, meal prep, occupation, laundry, yard work, shopping, and sleeping .   PERSONAL FACTORS Fitness and 3+ comorbidities: DM, obesity, HTN, GERD, chronic pain  are also affecting patient's functional outcome.     GOALS: Goals reviewed with patient? Yes  SHORT TERM GOALS: Target date: 08/27/2021     Ind with initial HEP Baseline: Goal status: MET   LONG TERM GOALS: Target date: 09/24/2021    Ind with progressed HEP to improve carryover Baseline:  Goal status: IN PROGRESS  2.  Report 75% improvement in low back pain at end of work day.  Baseline: 8-9.10 end of day; reports 40% improvement (08/25/21) Goal  status: IN PROGRESS (6/10 now, but still stiff in low back - 09/21/21)  3.  Report 75% decrease in sleep disruption due to LBP.  Baseline: difficulty falling asleep, frequent wakes for 1+hour; wakes less often from back pain Goal status: IN PROGRESS (40% improvement in sleep disruption)  4.  Improve FOTO to 55% to demonstrate improved function.  Baseline: 38% Goal status: IN PROGRESS   PLAN: PT FREQUENCY: 2x/week  PT DURATION: 6 weeks  PLANNED INTERVENTIONS: Therapeutic exercises, Therapeutic activity, Neuromuscular re-education, Balance training, Gait training, Patient/Family education, Joint mobilization, Stair training, Dry Needling, Spinal mobilization, Cryotherapy, Moist heat, Traction, Ultrasound, and Manual therapy.  PLAN FOR NEXT SESSION:  Review goals, 30 day hold or D/C.  Possible DN   Rennie Natter, PT, DPT 09/24/2021, 8:48 AM

## 2021-09-28 ENCOUNTER — Ambulatory Visit: Payer: 59 | Admitting: Physical Therapy

## 2021-09-28 ENCOUNTER — Encounter: Payer: Self-pay | Admitting: Physical Therapy

## 2021-09-28 DIAGNOSIS — M62830 Muscle spasm of back: Secondary | ICD-10-CM

## 2021-09-28 DIAGNOSIS — M5459 Other low back pain: Secondary | ICD-10-CM | POA: Diagnosis not present

## 2021-09-28 DIAGNOSIS — R293 Abnormal posture: Secondary | ICD-10-CM

## 2021-09-28 DIAGNOSIS — M6281 Muscle weakness (generalized): Secondary | ICD-10-CM

## 2021-09-28 NOTE — Therapy (Addendum)
OUTPATIENT PHYSICAL THERAPY TREATMENT NOTE  PHYSICAL THERAPY DISCHARGE SUMMARY  Visits from Start of Care: 12  Current functional level related to goals / functional outcomes: From note below on 09/28/2021 "Pt. Reports she has less pain with ADLs like putting dishes in bottom of dishwasher, but continues to be limited with walking, bending, and sleeping.  Her FOTO improved from 38% to 50%, but she did not meet her long term goals.  She does feel she has the tools to continue strengthening her core and improving activity tolerance, especially in pool.  Focused session on manual therapy to decrease pain.  She would like to be placed on 30 day hold today, returns to MD on 10/26/21."   Remaining deficits: LBP with walking, bending, sleeping.     Education / Equipment: HEP  Plan: Patient agrees to discharge.  Patient goals were not met. Patient is being discharged due to not returning within 30 day window.     Rennie Natter, PT, DPT 3:22 PM 11/02/2021   Patient Name: EMMALOU HUNGER MRN: [id number] DOB:09/25/1960, 61 y.o., female Today's Date: 09/28/2021  Rationale for Evaluation and Treatment Rehabilitation    PT End of Session - 09/28/21 1611     Visit Number 12    Number of Visits 12    Date for PT Re-Evaluation 09/30/21    Authorization Type [employer] [id number]    PT Start Time 1611    PT Stop Time 1650   requested to leave early   PT Time Calculation (min) 39 min    Activity Tolerance Patient tolerated treatment well    Behavior During Therapy [facility] for tasks assessed/performed                   Past Medical History:  Diagnosis Date   Allergy    Arthritis    Chronic kidney disease    kidney failure 02/2018   Diabetes mellitus    GERD (gastroesophageal reflux disease)    Hypertension    Obese    Sleep apnea    no cpap   Thyroid disease    Hypothyroid   Past Surgical History:  Procedure Laterality Date   ABDOMINAL HYSTERECTOMY  2011   TAH RSO.teratoma/dr  lentz   CERVICAL CONE BIOPSY  1990   HERNIA REPAIR     HIP SURGERY     Bilateral replacement   IRRIGATION AND DEBRIDEMENT ABSCESS N/A 01/22/2018   Procedure: IRRIGATION AND DEBRIDEMENT BACK ABSCESS;  Surgeon: Excell Seltzer, MD;  Location: [facility];  Service: General;  Laterality: N/A;   OOPHORECTOMY     LSO and RSO   Patient Active Problem List   Diagnosis Date Noted   Abscess of back 01/22/2018   Severe sepsis with septic shock ([place]) 01/22/2018   Sepsis ([place]) 01/21/2018   AKI (acute kidney injury) ([employer]) 01/21/2018   Hyponatremia 01/21/2018   DM (diabetes mellitus) ([place]) 01/21/2018   Thyroid disease    Hypertension     PCP: Shon Baton, MD  REFERRING PROVIDER: Ladonna Snide, MD  REFERRING DIAG: M54.16 Lumbar Radiculopathy  THERAPY DIAG:  Other low back pain  Abnormal posture  Muscle weakness (generalized)  Muscle spasm of back  ONSET DATE: Dec 2023  SUBJECTIVE:  SUBJECTIVE STATEMENT: Pt. Reports pain "at the bottom of my spine" still.  Had to work this weekend so didn't get a chance to get into her pool.  Feels she has the tools to continue to strengthen her back but overall pain has not improved significantly.    PERTINENT HISTORY:  History of bil THR (L revised), abdominal hysterectomy, HTN, diabetes, kidney disease, GERD, thyroid disease, OA, smoker  PAIN:  Are you having pain? yes : NPRS scale: 3/10  Pain location: right lumbar Pain description: ache   PRECAUTIONS: None   PATIENT GOALS: don't want to deal with this, don't want to be scared because I'm afraid I'm going to end up paralyzed.    OBJECTIVE:  from initial evaluation unless dated  DIAGNOSTIC FINDINGS:  MRI lumbar - multilevel degenerative changes, severe bilateral neural foraminal narrowing L5-S1,  moderated bil neural foraminal narrowing L2-L3 and L3-L4.  Mild dextroconvex scoliosis, apex at L2  PATIENT SURVEYS:  FOTO 38%.  Predicted outcome 55% after 12 visits  SENSATION: "Skin feels plastic on left side " along L L4/5 dermatomes  MUSCLE LENGTH: Hamstrings: Right 75 deg; Left 45 deg limited by pain   Prone knee bend: Right 90 deg; Left 90 deg  POSTURE:  Forward flexed posture, lumbar scoliosis L shoulder higher than R, decreased lumbar lordosis, forward head posture.   PALPATION: Tenderness throughout lumbar paraspinals, L1-L5 with PA mobs, hypomobile, bil SIJ L>R, L glutes/piriformis.    LUMBAR ROM:   Active  A/PROM  08/13/2021  Flexion 50% limitation  Extension 75% limited  Right lateral flexion Slight better than R but increased pain  Left lateral flexion Mid thigh pain  Right rotation Inc pain limited 50%  Left rotation Inc pain limited 75% L   (Blank rows = not tested)  LE ROM:  Passive  Right 08/13/2021 Left 08/13/2021  Hip flexion    Hip extension    Hip abduction    Hip adduction    Hip internal rotation Very limited < 10 deg  Very limited <10 deg   Hip external rotation  Decreased 50% compared to R  Knee flexion    Knee extension     (Blank rows = not tested)  LE MMT:  MMT Right 08/25/2021 Left 08/25/2021  Hip flexion 5 4* revised THR  Hip extension    Hip abduction 5 5  Hip adduction 5 5  Hip internal rotation    Hip external rotation    Knee flexion 5 5  Knee extension 5 5  Ankle dorsiflexion 5 5  Ankle plantarflexion 5 5   (Blank rows = not tested)  LUMBAR SPECIAL TESTS:  Straight leg raise test: Positive left  FUNCTIONAL TESTS:  5 times sit to stand: 16.5 sec without UE assist    TODAY'S TREATMENT  09/28/21 Therapeutic Exercise: to improve strength and mobility.  Demo, verbal and tactile cues throughout for technique. Nustep L6 x 6 min  Review of current HEP, answered questions on exercises.  Manual Therapy: to decrease muscle  spasm and pain and improve mobility.  STM to lumbar and thoracic paraspinals, UPA mobs lumbar spine.  STM/TPR bil QL, glutes and piriformis.  Self Care: discussion of next steps, continued strengthening, recommendations pools for fall/winter/spring, recommended return to MD   09/24/21 Therapeutic Exercise:  Nustep L6 x 6 min  Cybex knee extension 20# 2 x 10  Cybex knee flexion 20# 3 x 10  Cybex rows 25# 2 x 10  Wall push-ups x 10, at counter x  10 Review T-band exercises - rows, shoulder extension, paloff press and multifidi walkout At counter - exercises for aquatic therapy -heel raises, hip abduction, hip extension, hip flexion, lunges, side stepping, SLS, tandem walking. Reviewed and discussed other exercises currently performing and examples of other exercises added to HEP.    09/21/21 Therapeutic Exercise: Nustep L5x2mn Cybex knee extension 15# 2x10 bil Cybex knee flexion 25# 2x10 bil Cybex row 25# 2x10 Lat pull in standing 15# 2x10 Multifidus walkout with blue TB 10x each side Pallof press blue TB 10x bil  Manual therapy: Manual lumbar decompression in supine Instruction given on ways to do lumbar traction at home   PATIENT EDUCATION:  Education details: continue HEP, focus on "yummy side" movements Person educated: Patient Education method: Explanation Education comprehension: verbalized understanding   HOME EXERCISE PROGRAM: Access Code: [email or web address] Update 09/24/21 [email or web address] aquatic exercises.    ASSESSMENT:  CLINICAL IMPRESSION: Pt. Reports she has less pain with ADLs like putting dishes in bottom of dishwasher, but continues to be limited with walking, bending, and sleeping.  Her FOTO improved from 38% to 50%, but she did not meet her long term goals.  She does feel she has the tools to continue strengthening her core and improving activity tolerance, especially in pool.  Focused session on manual therapy to decrease pain.  She would like to be placed on 30 day hold  today, returns to MD on 10/26/21.    OBJECTIVE IMPAIRMENTS Abnormal gait, decreased activity tolerance, decreased endurance, decreased mobility, difficulty walking, decreased ROM, decreased strength, increased muscle spasms, impaired flexibility, impaired sensation, improper body mechanics, postural dysfunction, and pain.   ACTIVITY LIMITATIONS cleaning, community activity, meal prep, occupation, laundry, yard work, shopping, and sleeping .   PERSONAL FACTORS Fitness and 3+ comorbidities: DM, obesity, HTN, GERD, chronic pain  are also affecting patient's functional outcome.     GOALS: Goals reviewed with patient? Yes  SHORT TERM GOALS: Target date: 08/27/2021     Ind with initial HEP Baseline: Goal status: MET   LONG TERM GOALS: Target date: 09/24/2021    Ind with progressed HEP to improve carryover Baseline:  Goal status: MET  2.  Report 75% improvement in low back pain at end of work day.  Baseline: 8-9.10 end of day; reports 40% improvement (08/25/21) Goal status: NOT MET (6/10 now, but still stiff in low back - 09/21/21.  09/28/21- still about the same.   3.  Report 75% decrease in sleep disruption due to LBP.  Baseline: difficulty falling asleep, frequent wakes for 1+hour; wakes less often from back pain Goal status: NOT MET 09/28/21- still wakes at night, gets about 3 hours sleep at a stretch.    4.  Improve FOTO to 55% to demonstrate improved function.  Baseline: 38% Goal status: NOT MET 09/28/21- 50%   PLAN: PT FREQUENCY: 2x/week  PT DURATION: 6 weeks  PLANNED INTERVENTIONS: Therapeutic exercises, Therapeutic activity, Neuromuscular re-education, Balance training, Gait training, Patient/Family education, Joint mobilization, Stair training, Dry Needling, Spinal mobilization, Cryotherapy, Moist heat, Traction, Ultrasound, and Manual therapy.  PLAN FOR NEXT SESSION:  30 day hold.   ERennie Natter PT, DPT 09/28/2021, 5:06 PM

## 2022-02-18 ENCOUNTER — Other Ambulatory Visit: Payer: Self-pay | Admitting: Internal Medicine

## 2022-02-18 DIAGNOSIS — Z1231 Encounter for screening mammogram for malignant neoplasm of breast: Secondary | ICD-10-CM

## 2022-04-26 ENCOUNTER — Ambulatory Visit
Admission: RE | Admit: 2022-04-26 | Discharge: 2022-04-26 | Disposition: A | Discharge status: Home / Self Care | Payer: 59 | Source: Ambulatory Visit | Attending: Internal Medicine | Admitting: Internal Medicine

## 2022-04-26 DIAGNOSIS — Z1231 Encounter for screening mammogram for malignant neoplasm of breast: Secondary | ICD-10-CM

## 2022-08-13 ENCOUNTER — Encounter (HOSPITAL_BASED_OUTPATIENT_CLINIC_OR_DEPARTMENT_OTHER): Payer: Self-pay

## 2022-08-13 ENCOUNTER — Other Ambulatory Visit: Payer: Self-pay

## 2022-08-13 ENCOUNTER — Emergency Department (HOSPITAL_BASED_OUTPATIENT_CLINIC_OR_DEPARTMENT_OTHER)
Admission: EM | Admit: 2022-08-13 | Discharge: 2022-08-13 | Disposition: A | Discharge status: Home / Self Care | Payer: 59 | Attending: Emergency Medicine | Admitting: Emergency Medicine

## 2022-08-13 DIAGNOSIS — M545 Low back pain, unspecified: Secondary | ICD-10-CM | POA: Diagnosis present

## 2022-08-13 DIAGNOSIS — M5442 Lumbago with sciatica, left side: Secondary | ICD-10-CM | POA: Insufficient documentation

## 2022-08-13 DIAGNOSIS — Z7982 Long term (current) use of aspirin: Secondary | ICD-10-CM | POA: Insufficient documentation

## 2022-08-13 DIAGNOSIS — M5432 Sciatica, left side: Secondary | ICD-10-CM

## 2022-08-13 MED ORDER — PREDNISONE 20 MG PO TABS: 40.0000 mg | ORAL_TABLET | Freq: Every day | ORAL | 0 refills | Status: AC

## 2022-08-13 MED ORDER — NAPROXEN 500 MG PO TABS: 500.0000 mg | ORAL_TABLET | Freq: Two times a day (BID) | ORAL | 0 refills | Status: AC

## 2022-08-13 MED ORDER — PREDNISONE 20 MG PO TABS
40.0000 mg | ORAL_TABLET | Freq: Once | ORAL | Status: AC
  Administered 2022-08-13: 40 mg via ORAL
  Filled 2022-08-13: qty 2

## 2022-08-13 MED ORDER — KETOROLAC TROMETHAMINE 30 MG/ML IJ SOLN
30.0000 mg | Freq: Once | INTRAMUSCULAR | Status: AC
  Administered 2022-08-13: 30 mg via INTRAMUSCULAR
  Filled 2022-08-13: qty 1

## 2022-08-13 MED ORDER — HYDROCODONE-ACETAMINOPHEN 5-325 MG PO TABS: 1.0000 | ORAL_TABLET | Freq: Four times a day (QID) | ORAL | 0 refills | Status: AC | PRN

## 2022-08-13 NOTE — ED Triage Notes (Signed)
Pt complaining of pain in the lower back that started right above the buttocks and runs down her left leg. Has been hurting real bad for the last 2 days. Has a hx of back pain for the last year or so.

## 2022-08-13 NOTE — ED Provider Notes (Signed)
[place] [facility]  Provider Note  CSN: [id number] Arrival date & time: 08/13/22 0305  History Chief Complaint  Patient presents with   Back Pain    Margaret Carey is a 62 y.o. female presents for evaluation of L lower back pain, worsening for the last several days, radiating down her L buttock and into the leg. No falls or injuries. No incontinence or fever. She denies any urinary symptoms. Has had back problems before but not seeing anyone currently about it. Pain not improved with home remedies or OTC meds.     Home Medications Prior to Admission medications   Medication Sig Start Date End Date Taking? Authorizing Provider  HYDROcodone-acetaminophen (NORCO/VICODIN) 5-325 MG tablet Take 1 tablet by mouth every 6 (six) hours as needed for severe pain. 08/13/22  Yes Pollyann Savoy, MD  naproxen (NAPROSYN) 500 MG tablet Take 1 tablet (500 mg total) by mouth 2 (two) times daily. 08/13/22  Yes Pollyann Savoy, MD  predniSONE (DELTASONE) 20 MG tablet Take 2 tablets (40 mg total) by mouth daily for 4 days. 08/13/22 08/17/22 Yes Pollyann Savoy, MD  amoxicillin (AMOXIL) 500 MG tablet Take 500 mg by mouth 2 (two) times daily. Taken before dental work    [provider]  aspirin 81 MG tablet Take 81 mg by mouth daily.    [provider]  blood glucose meter kit and supplies KIT Dispense based on patient and insurance preference. Use up to four times daily as directed. (FOR ICD-9 250.00, 250.01). 01/25/18   Marinda Elk, MD  canagliflozin (INVOKANA) 100 MG TABS tablet Take 100 mg by mouth.    [provider]  ibuprofen (ADVIL,MOTRIN) 600 MG tablet Take 1 tablet (600 mg total) by mouth every 6 (six) hours as needed. 05/28/16   Barrett Henle, PA-C  Insulin Pen Needle 31G X 6 MM MISC 1 Device by Does not apply route 2 (two) times daily. 01/25/18   Marinda Elk, MD  LEVEMIR FLEXTOUCH 100 UNIT/ML FlexPen  Inject into the skin. 02/13/20   [provider]  lisinopril (ZESTRIL) 10 MG tablet Take 10 mg by mouth daily.    [provider]  lisinopril-hydrochlorothiazide (PRINZIDE,ZESTORETIC) 10-12.5 MG per tablet Take 1 tablet by mouth 2 (two) times daily. Patient not taking: Reported on 08/13/2021    [provider]  metFORMIN (GLUCOPHAGE) 1000 MG tablet Take 1,000 mg by mouth 2 (two) times daily with a meal.    [provider]  omeprazole (PRILOSEC) 20 MG capsule Take 20 mg by mouth 2 (two) times a week.    [provider]  sitaGLIPtan-metformin (JANUMET) 50-1000 MG per tablet Take 1 tablet by mouth 2 (two) times daily with a meal. Patient not taking: Reported on 08/13/2021    [provider]  thyroid (ARMOUR) 180 MG tablet Take 180 mg by mouth daily. 180mg  po once daily    [provider]  TRULICITY 0.75 MG/0.5ML SOPN Inject 0.75 mg into the skin once a week. 12/28/17   [provider]     Allergies    Sulfa antibiotics   Review of Systems   Review of Systems Please see HPI for pertinent positives and negatives  Physical Exam BP (!) 163/78 (BP Location: Right Arm)   Pulse (!) 101   Temp 98.2 F (36.8 C) (Oral)   Resp 18   Ht 5\' 3"  (1.6 m)   Wt 104.3 kg   SpO2 98%  BMI 40.74 kg/m   Physical Exam Vitals and nursing note reviewed.  Constitutional:      Appearance: Normal appearance.  HENT:     Head: Normocephalic and atraumatic.     Nose: Nose normal.     Mouth/Throat:     Mouth: Mucous membranes are moist.  Eyes:     Extraocular Movements: Extraocular movements intact.     Conjunctiva/sclera: Conjunctivae normal.  Cardiovascular:     Rate and Rhythm: Normal rate.  Pulmonary:     Effort: Pulmonary effort is normal.     Breath sounds: Normal breath sounds.  Abdominal:     General: Abdomen is flat.     Palpations: Abdomen is soft.     Tenderness: There is no abdominal tenderness.  Musculoskeletal:         General: Tenderness (L lumbar paraspinal muscles and sciatic notch) present. No swelling. Normal range of motion.     Cervical back: Neck supple.  Skin:    General: Skin is warm and dry.  Neurological:     General: No focal deficit present.     Mental Status: She is alert.     Cranial Nerves: No cranial nerve deficit.     Sensory: No sensory deficit.     Motor: No weakness.     Coordination: Coordination normal.     Gait: Gait abnormal (antalgic).     Deep Tendon Reflexes: Reflexes normal.     Comments: Positive contralateral straight leg raise  Psychiatric:        Mood and Affect: Mood normal.     ED Results / Procedures / Treatments   EKG None  Procedures Procedures  Medications Ordered in the ED Medications  ketorolac (TORADOL) 30 MG/ML injection 30 mg (has no administration in time range)  predniSONE (DELTASONE) tablet 40 mg (has no administration in time range)    Initial Impression and Plan  Patient here with symptoms consistent with sciatica. No red flags for acute surgical process. Plan IM Toradol for pain. Begin a course of prednisone, advised this will affect her glucose, she is comfortable managing her DM while on steroids. Rx for Naprosyn, Norco as needed. Referral to Neurosurg for further management.   ED Course       MDM Rules/Calculators/A&P Medical Decision Making Problems Addressed: Sciatica of left side: acute illness or injury  Risk Prescription drug management.     Final Clinical Impression(s) / ED Diagnoses Final diagnoses:  Sciatica of left side    Rx / DC Orders ED Discharge Orders          Ordered    naproxen (NAPROSYN) 500 MG tablet  2 times daily        08/13/22 0334    HYDROcodone-acetaminophen (NORCO/VICODIN) 5-325 MG tablet  Every 6 hours PRN        08/13/22 0334    predniSONE (DELTASONE) 20 MG tablet  Daily        08/13/22 0334             Pollyann Savoy, MD 08/13/22 [phone]

## 2022-09-21 ENCOUNTER — Ambulatory Visit: Payer: 59 | Attending: Neurosurgery | Admitting: Physical Therapy

## 2022-09-21 ENCOUNTER — Encounter: Payer: Self-pay | Admitting: Physical Therapy

## 2022-09-21 DIAGNOSIS — M5459 Other low back pain: Secondary | ICD-10-CM | POA: Diagnosis present

## 2022-09-21 DIAGNOSIS — M5442 Lumbago with sciatica, left side: Secondary | ICD-10-CM | POA: Insufficient documentation

## 2022-09-21 DIAGNOSIS — R293 Abnormal posture: Secondary | ICD-10-CM | POA: Insufficient documentation

## 2022-09-21 DIAGNOSIS — M62830 Muscle spasm of back: Secondary | ICD-10-CM | POA: Insufficient documentation

## 2022-09-21 DIAGNOSIS — M6281 Muscle weakness (generalized): Secondary | ICD-10-CM | POA: Insufficient documentation

## 2022-09-21 NOTE — Therapy (Signed)
OUTPATIENT PHYSICAL THERAPY THORACOLUMBAR EVALUATION   Patient Name: Margaret Carey MRN: [id number] DOB:02-26-1961, 62 y.o., female Today's Date: 09/21/2022  END OF SESSION:  PT End of Session - 09/21/22 0811     Visit Number 1    Number of Visits 12    Date for PT Re-Evaluation 11/02/22    Authorization Type [employer] VL: [id number]    PT Start Time 0806    PT Stop Time 0845    PT Time Calculation (min) 39 min    Activity Tolerance Patient tolerated treatment well    Behavior During Therapy [facility] for tasks assessed/performed             Past Medical History:  Diagnosis Date   Allergy    Arthritis    Chronic kidney disease    kidney failure 02/2018   Diabetes mellitus    GERD (gastroesophageal reflux disease)    Hypertension    Obese    Sleep apnea    no cpap   Thyroid disease    Hypothyroid   Past Surgical History:  Procedure Laterality Date   ABDOMINAL HYSTERECTOMY  2011   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   CERVICAL CONE BIOPSY  1990   HERNIA REPAIR     HIP SURGERY     Bilateral replacement   IRRIGATION AND DEBRIDEMENT ABSCESS N/A 01/22/2018   Procedure: IRRIGATION AND DEBRIDEMENT BACK ABSCESS;  Surgeon: Glenna Fellows, MD;  Location: [facility];  Service: General;  Laterality: N/A;   OOPHORECTOMY     LSO and RSO   Patient Active Problem List   Diagnosis Date Noted   Abscess of back 01/22/2018   Severe sepsis with septic shock (HCC) 01/22/2018   Sepsis (HCC) 01/21/2018   AKI (acute kidney injury) (HCC) 01/21/2018   Hyponatremia 01/21/2018   DM (diabetes mellitus) (HCC) 01/21/2018   Thyroid disease    Hypertension     PCP: Creola Corn, MD   REFERRING PROVIDER: Lisbeth Renshaw, MD  REFERRING DIAG: M51.36 (ICD-10-CM) - Other intervertebral disc degeneration, lumbar region  Rationale for Evaluation and Treatment: Rehabilitation  THERAPY DIAG:  Acute left-sided low back pain with left-sided sciatica  Muscle weakness (generalized)  Muscle spasm of back  ONSET  DATE: 08/11/2022  SUBJECTIVE:                                                                                                                                                                                           SUBJECTIVE STATEMENT: Same thing, pain in left side low back going down left leg again, referred to neurosurgeon, MRI next Tuesday.  Pain started on on 08/11/2022, couldn't sit down, lay  down, sleep, doing everything could do and couldn't get pain down so went to ED on 08/13/22, this time is a little different, like leg is a little bruised, feels like after my leg is bruised, touch it and it hurts, it just started and got progressively worse, I was using my body mechanics and doing my exercises.   I did get a new chair at work but it still hurts even when I sit.  Waking at night, the diclofenac helps, but has to get up and sleep in recliner for a while, then switches back to bed.   PERTINENT HISTORY:  Arthritis, chronic LBP, DM (poorly controlled), GERD, HTN, Obestiy, hypothyroidism, bil THR (L revised), abdominal hysterectomy and hernia repair.   PAIN:  Are you having pain? Yes: NPRS scale: 4-5/10 Pain location: left side low back radiating down left leg to foot, numbness and tingling bottom of foot Pain description: at end of day 10-12/10 Aggravating factors: walking, sitting prolonged periods, laying down (has to switch between bed and recliner) Relieving factors: diclofenac  PRECAUTIONS: None  WEIGHT BEARING RESTRICTIONS: No  FALLS:  Has patient fallen in last 6 months? No  LIVING ENVIRONMENT: Lives with: lives alone Lives in: House/apartment Stairs: Yes: External: 3 steps; none Has following equipment at home: None  OCCUPATION: lab tech  PLOF: Independent  PATIENT GOALS: decrease pain   NEXT MD VISIT: October 14, 2022  OBJECTIVE:   DIAGNOSTIC FINDINGS:  MRI scheduled on Tuesday  PATIENT SURVEYS:  Modified Oswestry 27/50 = 54%   SCREENING FOR RED FLAGS: Bowel or  bladder incontinence: No Spinal tumors: No Cauda equina syndrome: No Compression fracture: No Abdominal aneurysm: No  COGNITION: Overall cognitive status: Within functional limits for tasks assessed     SENSATION: Numbness and tingling radiating to bottom of L foot  MUSCLE LENGTH: Hamstrings: Right 90 deg; Left -limited to 30 deg today by pain Quadricepst: moderate tightness bil  POSTURE: rounded shoulders and forward head  PALPATION: Tenderness with PA mobs in lumbar spine, L QL, glutes  LUMBAR ROM:   AROM eval  Flexion To knees, P!  Extension Limited 75%  Right lateral flexion To knee  Left lateral flexion Limited 75%, p!  Right rotation   Left rotation    (Blank rows = not tested)  LOWER EXTREMITY ROM:   deferred hip ROM today due to pain  LOWER EXTREMITY MMT:    MMT Right eval Left eval  Hip flexion 5 4  Hip extension    Hip abduction 5 5  Hip adduction 5 5  Knee flexion 5 4+p!  Knee extension 5 4p!  Ankle dorsiflexion 5 4+  Ankle plantarflexion 5 4+   (Blank rows = not tested)  LUMBAR SPECIAL TESTS:  Straight leg raise test: Positive left  GAIT: Distance walked: 50 Comments: no AD, antalgic  TODAY'S TREATMENT:  DATE:   09/21/2022 Therapeutic Exercise: to improve strength and mobility.  Demo, verbal and tactile cues throughout for technique.  Prone lying x 3 min - start with pillow under stomach if needed at home.  Prone knee bends x 10 bil  Side glides (left side to wall) x 10 Lumbar extensions (leaning against counter) x 10 Self Care: Findings, POC, education on McKenzie back extension program with signs of intolerance (pain/numbness/tingling worsening down her back).     PATIENT EDUCATION:  Education details: see above Person educated: Patient Education method: Programmer, multimedia, Demonstration, Verbal cues, and  Handouts Education comprehension: verbalized understanding and returned demonstration  HOME EXERCISE PROGRAM: Access Code: [email or web address] URL: [email or web address] Date: 09/21/2022 Prepared by: Harrie Foreman  Exercises - Prone Knee Flexion  - 1 x daily - 7 x weekly - 1 sets - 10 reps - Lateral Shift Correction at Wall  - 5 x daily - 7 x weekly - 1 sets - 10 reps - Standing Lumbar Extension with Counter  - 5 x daily - 7 x weekly - 1 sets - 10 reps  ASSESSMENT:  CLINICAL IMPRESSION: Patient is a 62 y.o. female who was seen today for physical therapy evaluation and treatment for acute left sided low back pain with left sciatica.  She has been seen previously by this PT for similar symptoms, but had acute onset on 08/11/22 of worsening symptoms, and continues to have pain and radicular symptoms, with new MRI ordered.  Today demonstrates positive Single leg raise.  Started on McKenzie extension protocol, tolerated standing exercises better than prone, with prone starting just laying on stomach, with pillow if needed, and prone knee bends as has significant neural tension.   Margaret Carey would benefit from skilled physical therapy to decrease LBP, centralize symptoms, and improve quality of life.   OBJECTIVE IMPAIRMENTS: Abnormal gait, decreased activity tolerance, decreased endurance, decreased mobility, difficulty walking, decreased ROM, decreased strength, hypomobility, increased fascial restrictions, increased muscle spasms, impaired flexibility, impaired sensation, and pain.   ACTIVITY LIMITATIONS: carrying, lifting, bending, sitting, standing, sleeping, stairs, transfers, bed mobility, and locomotion level  PARTICIPATION LIMITATIONS: occupation  PERSONAL FACTORS: Past/current experiences, Time since onset of injury/illness/exacerbation, and 3+ comorbidities: Arthritis, chronic LBP, DM (poorly controlled), GERD, HTN, Obestiy, hypothyroidism, bil THR (L revised), abdominal  hysterectomy and hernia repair.   are also affecting patient's functional outcome.   REHAB POTENTIAL: Good  CLINICAL DECISION MAKING: Evolving/moderate complexity  EVALUATION COMPLEXITY: Moderate    GOALS: Goals reviewed with patient? Yes  SHORT TERM GOALS: Target date: 10/05/2022   Patient will be independent with initial HEP.  Baseline:  Goal status: INITIAL  2.  Patient will report centralization of radicular symptoms.  Baseline:  Goal status: INITIAL  LONG TERM GOALS: Target date: 11/02/2022    Patient will be independent with advanced/ongoing HEP to improve outcomes and carryover.  Baseline:  Goal status: INITIAL  2.  Patient will report 75% improvement in low back pain to improve QOL.  Baseline:  Goal status: INITIAL  3.  Patient will demonstrate full pain free lumbar ROM to perform ADLs.   Baseline: see objective Goal status: INITIAL  4.  Patient will demonstrate improved functional strength as demonstrated by 5/5 LLE strength. Baseline: see objective Goal status: INITIAL  5.  Patient will report 6 points improvement on modified Oswestry demonstrate improved functional ability.  Baseline: 27/50 Goal status: INITIAL   6.  Patient will be able to sleep without interruption from LBP. Baseline: waking and having to sleep  in recliner Goal status: INITIAL  PLAN:  PT FREQUENCY: 1-2x/week  PT DURATION: 6 weeks  PLANNED INTERVENTIONS: Therapeutic exercises, Therapeutic activity, Neuromuscular re-education, Balance training, Gait training, Patient/Family education, Self Care, Joint mobilization, Joint manipulation, Dry Needling, Electrical stimulation, Spinal manipulation, Spinal mobilization, Cryotherapy, Moist heat, Taping, Traction, Ultrasound, Manual therapy, and Re-evaluation.  PLAN FOR NEXT SESSION: add sciatic nerve glides too.  Progress Mckenzie, Try traction to centralize symptoms.    Jena Gauss, PT, DPT 09/21/2022, 12:29 PM

## 2022-09-27 ENCOUNTER — Ambulatory Visit: Payer: 59 | Admitting: Physical Therapy

## 2022-09-27 ENCOUNTER — Encounter: Payer: Self-pay | Admitting: Physical Therapy

## 2022-09-27 DIAGNOSIS — M5442 Lumbago with sciatica, left side: Secondary | ICD-10-CM | POA: Diagnosis not present

## 2022-09-27 DIAGNOSIS — M62830 Muscle spasm of back: Secondary | ICD-10-CM

## 2022-09-27 DIAGNOSIS — M6281 Muscle weakness (generalized): Secondary | ICD-10-CM

## 2022-09-27 NOTE — Therapy (Signed)
OUTPATIENT PHYSICAL THERAPY TREATMENT   Patient Name: ARDEAN SIMONICH MRN: [id number] DOB:Apr 08, 1960, 62 y.o., female Today's Date: 09/27/2022  END OF SESSION:  PT End of Session - 09/27/22 0806     Visit Number 2    Number of Visits 12    Date for PT Re-Evaluation 11/02/22    Authorization Type Aet[employer] VL: [id number]    PT Start Time 0802    PT Stop Time 0844    PT Time Calculation (min) 42 min    Activity Tolerance Patient tolerated treatment well    Behavior During Therapy [facility] for tasks assessed/performed             Past Medical History:  Diagnosis Date   Allergy    Arthritis    Chronic kidney disease    kidney failure 02/2018   Diabetes mellitus    GERD (gastroesophageal reflux disease)    Hypertension    Obese    Sleep apnea    no cpap   Thyroid disease    Hypothyroid   Past Surgical History:  Procedure Laterality Date   ABDOMINAL HYSTERECTOMY  2011   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   CERVICAL CONE BIOPSY  1990   HERNIA REPAIR     HIP SURGERY     Bilateral replacement   IRRIGATION AND DEBRIDEMENT ABSCESS N/A 01/22/2018   Procedure: IRRIGATION AND DEBRIDEMENT BACK ABSCESS;  Surgeon: Glenna Fellows, MD;  Location: [facility];  Service: General;  Laterality: N/A;   OOPHORECTOMY     LSO and RSO   Patient Active Problem List   Diagnosis Date Noted   Abscess of back 01/22/2018   Severe sepsis with septic shock (HCC) 01/22/2018   Sepsis (HCC) 01/21/2018   AKI (acute kidney injury) (HCC) 01/21/2018   Hyponatremia 01/21/2018   DM (diabetes mellitus) (HCC) 01/21/2018   Thyroid disease    Hypertension     PCP: Creola Corn, MD   REFERRING PROVIDER: Creola Corn, MD  REFERRING DIAG: M51.36 (ICD-10-CM) - Other intervertebral disc degeneration, lumbar region  Rationale for Evaluation and Treatment: Rehabilitation  THERAPY DIAG:  Acute left-sided low back pain with left-sided sciatica  Muscle weakness (generalized)  Muscle spasm of back  ONSET DATE:  08/11/2022  SUBJECTIVE:                                                                                                                                                                                           SUBJECTIVE STATEMENT: Exercised in the pool this weekend, really trying to build her strength especially in the L leg.  Researching anti-inflammatory diets.  Back is tight today, but manageable, but had  to leave work early due to pain.   Started working on her HEP too.   PERTINENT HISTORY:  Arthritis, chronic LBP, DM (poorly controlled), GERD, HTN, Obestiy, hypothyroidism, bil THR (L revised), abdominal hysterectomy and hernia repair.   PAIN:  Are you having pain? Yes: NPRS scale: 3-4/10 Pain location: left side low back radiating down left leg to foot, numbness and tingling bottom of foot Pain description: at end of day 10-12/10 Aggravating factors: walking, sitting prolonged periods, laying down (has to switch between bed and recliner) Relieving factors: diclofenac  PRECAUTIONS: None  WEIGHT BEARING RESTRICTIONS: No  FALLS:  Has patient fallen in last 6 months? No  LIVING ENVIRONMENT: Lives with: lives alone Lives in: House/apartment Stairs: Yes: External: 3 steps; none Has following equipment at home: None  OCCUPATION: lab tech  PLOF: Independent  PATIENT GOALS: decrease pain   NEXT MD VISIT: October 14, 2022  OBJECTIVE:   DIAGNOSTIC FINDINGS:  MRI scheduled on Tuesday  PATIENT SURVEYS:  Modified Oswestry 27/50 = 54%   SCREENING FOR RED FLAGS: Bowel or bladder incontinence: No Spinal tumors: No Cauda equina syndrome: No Compression fracture: No Abdominal aneurysm: No  COGNITION: Overall cognitive status: Within functional limits for tasks assessed     SENSATION: Numbness and tingling radiating to bottom of L foot  MUSCLE LENGTH: Hamstrings: Right 90 deg; Left -limited to 30 deg today by pain Quadricepst: moderate tightness bil  POSTURE: rounded  shoulders and forward head  PALPATION: Tenderness with PA mobs in lumbar spine, L QL, glutes  LUMBAR ROM:   AROM eval  Flexion To knees, P!  Extension Limited 75%  Right lateral flexion To knee  Left lateral flexion Limited 75%, p!  Right rotation   Left rotation    (Blank rows = not tested)  LOWER EXTREMITY ROM:   deferred hip ROM today due to pain  LOWER EXTREMITY MMT:    MMT Right eval Left eval  Hip flexion 5 4  Hip extension    Hip abduction 5 5  Hip adduction 5 5  Knee flexion 5 4+p!  Knee extension 5 4p!  Ankle dorsiflexion 5 4+  Ankle plantarflexion 5 4+   (Blank rows = not tested)  LUMBAR SPECIAL TESTS:  Straight leg raise test: Positive left  GAIT: Distance walked: 50 Comments: no AD, antalgic  TODAY'S TREATMENT:                                                                                                                              DATE:    09/27/22 Therapeutic Exercise: to improve strength and mobility.  Demo, verbal and tactile cues throughout for technique. Nustep L5 x 6 min  Lateral shifts at wall x 10 bil  Back extensions at counter x 10 Prone knee bends Prone legs extensions 3 x 10 bil  Prone press-ups (on elbows)  2 x 10 Manual Therapy: to decrease muscle spasm and pain and improve mobility STM/TPR to  L glutes, QL, lumbar paraspinals, L UPA mobs lumbar spine.    09/21/2022 Therapeutic Exercise: to improve strength and mobility.  Demo, verbal and tactile cues throughout for technique.  Prone lying x 3 min - start with pillow under stomach if needed at home.  Prone knee bends x 10 bil  Side glides (left side to wall) x 10 Lumbar extensions (leaning against counter) x 10 Self Care: Findings, POC, education on McKenzie back extension program with signs of intolerance (pain/numbness/tingling worsening down her back).     PATIENT EDUCATION:  Education details: see above Person educated: Patient Education method: Programmer, multimedia,  Demonstration, Verbal cues, and Handouts Education comprehension: verbalized understanding and returned demonstration  HOME EXERCISE PROGRAM: Access Code: [email or web address] URL: [email or web address] Date: 09/27/2022 Prepared by: Harrie Foreman  Exercises - Lateral Shift Correction at Wall  - 5 x daily - 7 x weekly - 1 sets - 10 reps - Standing Lumbar Extension with Counter  - 5 x daily - 7 x weekly - 1 sets - 10 reps - Prone Knee Flexion  - 1 x daily - 7 x weekly - 1 sets - 10 reps - Prone Hip Extension  - 1 x daily - 7 x weekly - 3 sets - 10 reps - Prone Press Up  - 1 x daily - 7 x weekly - 1 sets - 10 reps  ASSESSMENT:  CLINICAL IMPRESSION: Rashae Rother Speagle reports good compliance with initial HEP, also trying to significantly increase her overall activity level and strengthen her LE.  Reveiwed and progressed her exercises following Mckenzie extension protocol, reviewing precautions to stop of symptoms peripheralize, tolerated well today.  Noted very tender still with L UPA mobs, also tender around scar tissue in L glute.  She also reports she has some tenderness in the front of her L shin starting around a varicosed vein. Tiandra Swoveland Kohan continues to demonstrate potential for improvement and would benefit from continued skilled therapy to address impairments.     OBJECTIVE IMPAIRMENTS: Abnormal gait, decreased activity tolerance, decreased endurance, decreased mobility, difficulty walking, decreased ROM, decreased strength, hypomobility, increased fascial restrictions, increased muscle spasms, impaired flexibility, impaired sensation, and pain.   ACTIVITY LIMITATIONS: carrying, lifting, bending, sitting, standing, sleeping, stairs, transfers, bed mobility, and locomotion level  PARTICIPATION LIMITATIONS: occupation  PERSONAL FACTORS: Past/current experiences, Time since onset of injury/illness/exacerbation, and 3+ comorbidities: Arthritis, chronic LBP, DM (poorly controlled),  GERD, HTN, Obestiy, hypothyroidism, bil THR (L revised), abdominal hysterectomy and hernia repair.   are also affecting patient's functional outcome.   REHAB POTENTIAL: Good  CLINICAL DECISION MAKING: Evolving/moderate complexity  EVALUATION COMPLEXITY: Moderate    GOALS: Goals reviewed with patient? Yes  SHORT TERM GOALS: Target date: 10/05/2022   Patient will be independent with initial HEP.  Baseline:  Goal status: IN PROGRESS  2.  Patient will report centralization of radicular symptoms.  Baseline:  Goal status: IN PROGRESS  LONG TERM GOALS: Target date: 11/02/2022    Patient will be independent with advanced/ongoing HEP to improve outcomes and carryover.  Baseline:  Goal status: IN PROGRESS  2.  Patient will report 75% improvement in low back pain to improve QOL.  Baseline:  Goal status: IN PROGRESS  3.  Patient will demonstrate full pain free lumbar ROM to perform ADLs.   Baseline: see objective Goal status: IN PROGRESS  4.  Patient will demonstrate improved functional strength as demonstrated by 5/5 LLE strength. Baseline: see objective Goal status: IN PROGRESS  5.  Patient will report 6  points improvement on modified Oswestry demonstrate improved functional ability.  Baseline: 27/50 Goal status: IN PROGRESS   6.  Patient will be able to sleep without interruption from LBP. Baseline: waking and having to sleep in recliner Goal status: IN PROGRESS  PLAN:  PT FREQUENCY: 1-2x/week  PT DURATION: 6 weeks  PLANNED INTERVENTIONS: Therapeutic exercises, Therapeutic activity, Neuromuscular re-education, Balance training, Gait training, Patient/Family education, Self Care, Joint mobilization, Joint manipulation, Dry Needling, Electrical stimulation, Spinal manipulation, Spinal mobilization, Cryotherapy, Moist heat, Taping, Traction, Ultrasound, Manual therapy, and Re-evaluation.  PLAN FOR NEXT SESSION: add sciatic nerve glides too.  Progress Mckenzie and core  strengthening exercises.  May traction to centralize symptoms.  Discussed trial of DN with estim to multifidi.    Jena Gauss, PT, DPT 09/27/2022, 10:02 AM

## 2022-09-29 ENCOUNTER — Ambulatory Visit: Payer: 59

## 2022-09-29 DIAGNOSIS — M6281 Muscle weakness (generalized): Secondary | ICD-10-CM

## 2022-09-29 DIAGNOSIS — M5442 Lumbago with sciatica, left side: Secondary | ICD-10-CM | POA: Diagnosis not present

## 2022-09-29 DIAGNOSIS — M5459 Other low back pain: Secondary | ICD-10-CM

## 2022-09-29 DIAGNOSIS — M62830 Muscle spasm of back: Secondary | ICD-10-CM

## 2022-09-29 DIAGNOSIS — R293 Abnormal posture: Secondary | ICD-10-CM

## 2022-09-29 NOTE — Therapy (Signed)
OUTPATIENT PHYSICAL THERAPY TREATMENT   Patient Name: KALISE FICKETT MRN: [id number] DOB:11-25-1960, 62 y.o., female Today's Date: 09/29/2022  END OF SESSION:  PT End of Session - 09/29/22 0805     Visit Number 3    Number of Visits 12    Date for PT Re-Evaluation 11/02/22    Authorization Type [employer] VL: [id number]    PT Start Time 0759    PT Stop Time 0843    PT Time Calculation (min) 44 min    Activity Tolerance Patient tolerated treatment well    Behavior During Therapy [facility] for tasks assessed/performed              Past Medical History:  Diagnosis Date   Allergy    Arthritis    Chronic kidney disease    kidney failure 02/2018   Diabetes mellitus    GERD (gastroesophageal reflux disease)    Hypertension    Obese    Sleep apnea    no cpap   Thyroid disease    Hypothyroid   Past Surgical History:  Procedure Laterality Date   ABDOMINAL HYSTERECTOMY  2011   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   CERVICAL CONE BIOPSY  1990   HERNIA REPAIR     HIP SURGERY     Bilateral replacement   IRRIGATION AND DEBRIDEMENT ABSCESS N/A 01/22/2018   Procedure: IRRIGATION AND DEBRIDEMENT BACK ABSCESS;  Surgeon: Glenna Fellows, MD;  Location: [facility];  Service: General;  Laterality: N/A;   OOPHORECTOMY     LSO and RSO   Patient Active Problem List   Diagnosis Date Noted   Abscess of back 01/22/2018   Severe sepsis with septic shock (HCC) 01/22/2018   Sepsis (HCC) 01/21/2018   AKI (acute kidney injury) (HCC) 01/21/2018   Hyponatremia 01/21/2018   DM (diabetes mellitus) (HCC) 01/21/2018   Thyroid disease    Hypertension     PCP: Creola Corn, MD   REFERRING PROVIDER: Lisbeth Renshaw, MD  REFERRING DIAG: M51.36 (ICD-10-CM) - Other intervertebral disc degeneration, lumbar region  Rationale for Evaluation and Treatment: Rehabilitation  THERAPY DIAG:  Acute left-sided low back pain with left-sided sciatica  Muscle weakness (generalized)  Muscle spasm of back  Other low back  pain  Abnormal posture  ONSET DATE: 08/11/2022  SUBJECTIVE:                                                                                                                                                                                           SUBJECTIVE STATEMENT:  Had MRI yesterday, her back hurt a bit more d/t lying on her back.  PERTINENT HISTORY:  Arthritis, chronic  LBP, DM (poorly controlled), GERD, HTN, Obestiy, hypothyroidism, bil THR (L revised), abdominal hysterectomy and hernia repair.   PAIN:  Are you having pain? Yes: NPRS scale: 3/10 Pain location: across low back; leg pain at night Pain description: at end of day 10-12/10 Aggravating factors: walking, sitting prolonged periods, laying down (has to switch between bed and recliner) Relieving factors: diclofenac  PRECAUTIONS: None  WEIGHT BEARING RESTRICTIONS: No  FALLS:  Has patient fallen in last 6 months? No  LIVING ENVIRONMENT: Lives with: lives alone Lives in: House/apartment Stairs: Yes: External: 3 steps; none Has following equipment at home: None  OCCUPATION: lab tech  PLOF: Independent  PATIENT GOALS: decrease pain   NEXT MD VISIT: October 14, 2022  OBJECTIVE:   DIAGNOSTIC FINDINGS:  MRI scheduled on Tuesday  PATIENT SURVEYS:  Modified Oswestry 27/50 = 54%   SCREENING FOR RED FLAGS: Bowel or bladder incontinence: No Spinal tumors: No Cauda equina syndrome: No Compression fracture: No Abdominal aneurysm: No  COGNITION: Overall cognitive status: Within functional limits for tasks assessed     SENSATION: Numbness and tingling radiating to bottom of L foot  MUSCLE LENGTH: Hamstrings: Right 90 deg; Left -limited to 30 deg today by pain Quadricepst: moderate tightness bil  POSTURE: rounded shoulders and forward head  PALPATION: Tenderness with PA mobs in lumbar spine, L QL, glutes  LUMBAR ROM:   AROM eval  Flexion To knees, P!  Extension Limited 75%  Right lateral flexion To  knee  Left lateral flexion Limited 75%, p!  Right rotation   Left rotation    (Blank rows = not tested)  LOWER EXTREMITY ROM:   deferred hip ROM today due to pain  LOWER EXTREMITY MMT:    MMT Right eval Left eval  Hip flexion 5 4  Hip extension    Hip abduction 5 5  Hip adduction 5 5  Knee flexion 5 4+p!  Knee extension 5 4p!  Ankle dorsiflexion 5 4+  Ankle plantarflexion 5 4+   (Blank rows = not tested)  LUMBAR SPECIAL TESTS:  Straight leg raise test: Positive left  GAIT: Distance walked: 50 Comments: no AD, antalgic  TODAY'S TREATMENT:                                                                                                                              DATE:   09/29/22 Therapeutic Exercise: to improve strength and mobility.  Demo, verbal and tactile cues throughout for technique. Nustep L5 x 6 min Standing lumbar extension 10x3" Prone on elbows x 1 min Prone knee bends 2x10 bil Prone hip extension 2x10 bil Prone arm raises 2x5 bil Seated lumbar extension with green TB (doubled) x 10  Standing rows green TB 2x10  Standing shoulder extension green TB 2x10  09/27/22 Therapeutic Exercise: to improve strength and mobility.  Demo, verbal and tactile cues throughout for technique. Nustep L5 x 6 min  Lateral shifts at wall x 10 bil  Back extensions at counter x 10 Prone knee bends Prone legs extensions 3 x 10 bil  Prone press-ups (on elbows)  2 x 10 Manual Therapy: to decrease muscle spasm and pain and improve mobility STM/TPR to L glutes, QL, lumbar paraspinals, L UPA mobs lumbar spine.    09/21/2022 Therapeutic Exercise: to improve strength and mobility.  Demo, verbal and tactile cues throughout for technique.  Prone lying x 3 min - start with pillow under stomach if needed at home.  Prone knee bends x 10 bil  Side glides (left side to wall) x 10 Lumbar extensions (leaning against counter) x 10 Self Care: Findings, POC, education on McKenzie back extension  program with signs of intolerance (pain/numbness/tingling worsening down her back).     PATIENT EDUCATION:  Education details: HEP update Person educated: Patient Education method: Explanation, Demonstration, Verbal cues, and Handouts Education comprehension: verbalized understanding and returned demonstration  HOME EXERCISE PROGRAM: Access Code: [email or web address] URL: [email or web address] Date: 09/29/2022 Prepared by: Verta Ellen  Exercises - Lateral Shift Correction at Wall  - 5 x daily - 7 x weekly - 1 sets - 10 reps - Standing Lumbar Extension with Counter  - 5 x daily - 7 x weekly - 1 sets - 10 reps - Prone Knee Flexion  - 1 x daily - 7 x weekly - 1 sets - 10 reps - Prone Hip Extension  - 1 x daily - 7 x weekly - 3 sets - 10 reps - Prone Press Up  - 1 x daily - 7 x weekly - 1 sets - 10 reps - Prone Alternating Arm and Leg Lifts  - 1 x daily - 7 x weekly - 3 sets - 10 reps  ASSESSMENT:  CLINICAL IMPRESSION: Progressed through extension based exercises to reduce nerve compression and reduce radicular pain in low back. Pt showed good demonstration of exercises, however very challenged with arm raises and hip extension in prone. She has had an MRI done yesterday. Radicular pain is not bad this morning but she reports by the end of the day it increases. She tolerated the progression of exercises well. Yuliana Vandrunen Lucus continues to demonstrate potential for improvement and would benefit from continued skilled therapy to address impairments.     OBJECTIVE IMPAIRMENTS: Abnormal gait, decreased activity tolerance, decreased endurance, decreased mobility, difficulty walking, decreased ROM, decreased strength, hypomobility, increased fascial restrictions, increased muscle spasms, impaired flexibility, impaired sensation, and pain.   ACTIVITY LIMITATIONS: carrying, lifting, bending, sitting, standing, sleeping, stairs, transfers, bed mobility, and locomotion level  PARTICIPATION  LIMITATIONS: occupation  PERSONAL FACTORS: Past/current experiences, Time since onset of injury/illness/exacerbation, and 3+ comorbidities: Arthritis, chronic LBP, DM (poorly controlled), GERD, HTN, Obestiy, hypothyroidism, bil THR (L revised), abdominal hysterectomy and hernia repair.   are also affecting patient's functional outcome.   REHAB POTENTIAL: Good  CLINICAL DECISION MAKING: Evolving/moderate complexity  EVALUATION COMPLEXITY: Moderate    GOALS: Goals reviewed with patient? Yes  SHORT TERM GOALS: Target date: 10/05/2022   Patient will be independent with initial HEP.  Baseline:  Goal status: MET- 09/29/22  2.  Patient will report centralization of radicular symptoms.  Baseline:  Goal status: IN PROGRESS  LONG TERM GOALS: Target date: 11/02/2022    Patient will be independent with advanced/ongoing HEP to improve outcomes and carryover.  Baseline:  Goal status: IN PROGRESS  2.  Patient will report 75% improvement in low back pain to improve QOL.  Baseline:  Goal status: IN PROGRESS  3.  Patient will demonstrate  full pain free lumbar ROM to perform ADLs.   Baseline: see objective Goal status: IN PROGRESS  4.  Patient will demonstrate improved functional strength as demonstrated by 5/5 LLE strength. Baseline: see objective Goal status: IN PROGRESS  5.  Patient will report 6 points improvement on modified Oswestry demonstrate improved functional ability.  Baseline: 27/50 Goal status: IN PROGRESS   6.  Patient will be able to sleep without interruption from LBP. Baseline: waking and having to sleep in recliner Goal status: IN PROGRESS  PLAN:  PT FREQUENCY: 1-2x/week  PT DURATION: 6 weeks  PLANNED INTERVENTIONS: Therapeutic exercises, Therapeutic activity, Neuromuscular re-education, Balance training, Gait training, Patient/Family education, Self Care, Joint mobilization, Joint manipulation, Dry Needling, Electrical stimulation, Spinal manipulation, Spinal  mobilization, Cryotherapy, Moist heat, Taping, Traction, Ultrasound, Manual therapy, and Re-evaluation.  PLAN FOR NEXT SESSION: add sciatic nerve glides too.  Progress Mckenzie and core strengthening exercises.  May traction to centralize symptoms.  Discussed trial of DN with estim to multifidi.    Darleene Cleaver, PTA 09/29/2022, 8:46 AM

## 2022-10-04 ENCOUNTER — Encounter: Payer: 59 | Admitting: Physical Therapy

## 2022-10-06 ENCOUNTER — Encounter: Payer: 59 | Admitting: Physical Therapy

## 2022-10-11 ENCOUNTER — Encounter: Payer: Self-pay | Admitting: Physical Therapy

## 2022-10-11 ENCOUNTER — Ambulatory Visit: Payer: 59 | Attending: Neurosurgery | Admitting: Physical Therapy

## 2022-10-11 DIAGNOSIS — M62830 Muscle spasm of back: Secondary | ICD-10-CM | POA: Diagnosis present

## 2022-10-11 DIAGNOSIS — R293 Abnormal posture: Secondary | ICD-10-CM | POA: Diagnosis present

## 2022-10-11 DIAGNOSIS — M5459 Other low back pain: Secondary | ICD-10-CM | POA: Diagnosis present

## 2022-10-11 DIAGNOSIS — M5442 Lumbago with sciatica, left side: Secondary | ICD-10-CM | POA: Insufficient documentation

## 2022-10-11 DIAGNOSIS — M6281 Muscle weakness (generalized): Secondary | ICD-10-CM | POA: Insufficient documentation

## 2022-10-11 NOTE — Therapy (Signed)
OUTPATIENT PHYSICAL THERAPY TREATMENT   Patient Name: Margaret Carey MRN: [id number] DOB:[birth year], 62 y.o., female Today's Date: 10/11/2022  END OF SESSION:  PT End of Session - 10/11/22 0808     Visit Number 4    Number of Visits 12    Date for PT Re-Evaluation 11/02/22    Authorization Type [employer] VL: [id number]    PT Start Time 0805    PT Stop Time 0849    PT Time Calculation (min) 44 min    Activity Tolerance Patient tolerated treatment well    Behavior During Therapy [facility] for tasks assessed/performed              Past Medical History:  Diagnosis Date   Allergy    Arthritis    Chronic kidney disease    kidney failure 02/2018   Diabetes mellitus    GERD (gastroesophageal reflux disease)    Hypertension    Obese    Sleep apnea    no cpap   Thyroid disease    Hypothyroid   Past Surgical History:  Procedure Laterality Date   ABDOMINAL HYSTERECTOMY  2011   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   CERVICAL CONE BIOPSY  1990   HERNIA REPAIR     HIP SURGERY     Bilateral replacement   IRRIGATION AND DEBRIDEMENT ABSCESS N/A 01/22/2018   Procedure: IRRIGATION AND DEBRIDEMENT BACK ABSCESS;  Surgeon: Glenna Fellows, MD;  Location: [facility];  Service: General;  Laterality: N/A;   OOPHORECTOMY     LSO and RSO   Patient Active Problem List   Diagnosis Date Noted   Abscess of back 01/22/2018   Severe sepsis with septic shock (HCC) 01/22/2018   Sepsis (HCC) 01/21/2018   AKI (acute kidney injury) (HCC) 01/21/2018   Hyponatremia 01/21/2018   DM (diabetes mellitus) (HCC) 01/21/2018   Thyroid disease    Hypertension     PCP: Creola Corn, MD   REFERRING PROVIDER: Lisbeth Renshaw, MD  REFERRING DIAG: M51.36 (ICD-10-CM) - Other intervertebral disc degeneration, lumbar region  Rationale for Evaluation and Treatment: Rehabilitation  THERAPY DIAG:  Acute left-sided low back pain with left-sided sciatica  Muscle weakness (generalized)  Muscle spasm of back  ONSET DATE:  08/11/2022  SUBJECTIVE:                                                                                                                                                                                           SUBJECTIVE STATEMENT: Had a couple of bad days, hard to sleep due to pain.  Goes to neurosurgeon Wednesday to get results of MRI.   PERTINENT HISTORY:  Arthritis, chronic  LBP, DM (poorly controlled), GERD, HTN, Obestiy, hypothyroidism, bil THR (L revised), abdominal hysterectomy and hernia repair.   PAIN:  Are you having pain? Yes: NPRS scale: 2/10 Pain location: across low back; leg pain at night Pain description: at end of day 10-12/10 Aggravating factors: walking, sitting prolonged periods, laying down (has to switch between bed and recliner) Relieving factors: diclofenac  PRECAUTIONS: None  WEIGHT BEARING RESTRICTIONS: No  FALLS:  Has patient fallen in last 6 months? No  LIVING ENVIRONMENT: Lives with: lives alone Lives in: House/apartment Stairs: Yes: External: 3 steps; none Has following equipment at home: None  OCCUPATION: lab tech  PLOF: Independent  PATIENT GOALS: decrease pain   NEXT MD VISIT: October 14, 2022  OBJECTIVE:   DIAGNOSTIC FINDINGS:  MRI scheduled on Tuesday  PATIENT SURVEYS:  Modified Oswestry 27/50 = 54%   SCREENING FOR RED FLAGS: Bowel or bladder incontinence: No Spinal tumors: No Cauda equina syndrome: No Compression fracture: No Abdominal aneurysm: No  COGNITION: Overall cognitive status: Within functional limits for tasks assessed     SENSATION: Numbness and tingling radiating to bottom of L foot  MUSCLE LENGTH: Hamstrings: Right 90 deg; Left -limited to 30 deg today by pain Quadricepst: moderate tightness bil  POSTURE: rounded shoulders and forward head  PALPATION: Tenderness with PA mobs in lumbar spine, L QL, glutes  LUMBAR ROM:   AROM eval  Flexion To knees, P!  Extension Limited 75%  Right lateral flexion To  knee  Left lateral flexion Limited 75%, p!  Right rotation   Left rotation    (Blank rows = not tested)  LOWER EXTREMITY ROM:   deferred hip ROM today due to pain  LOWER EXTREMITY MMT:    MMT Right eval Left eval  Hip flexion 5 4  Hip extension    Hip abduction 5 5  Hip adduction 5 5  Knee flexion 5 4+p!  Knee extension 5 4p!  Ankle dorsiflexion 5 4+  Ankle plantarflexion 5 4+   (Blank rows = not tested)  LUMBAR SPECIAL TESTS:  Straight leg raise test: Positive left  GAIT: Distance walked: 50 Comments: no AD, antalgic  TODAY'S TREATMENT:                                                                                                                              DATE:   10/11/2022 Therapeutic Exercise: to improve strength and mobility.  Demo, verbal and tactile cues throughout for technique. Bike L2 x 6 min  Prone leg extension 3 x 10 bil (after Estim to mutlfidi) Seated "rainbow knees" x 10 bil - very difficult on L Standing open doors x 10 bil   Attended Estim: to stimulate multifidi to decrease low back pain using TrDN to directly stimulate muscles.  Trigger Point Dry-Needling  Treatment instructions: Expect mild to moderate muscle soreness. S/S of pneumothorax if dry needled over a lung field, and to seek immediate medical attention should they occur.  Patient verbalized understanding of these instructions and education. Patient Consent Given: Yes Education handout provided: Yes Muscles treated: L2 multifidi Electrical stimulation performed: Yes Parameters:  30 milliamps, frequency high enough to elicit twitch x 5 min  Treatment response/outcome: Twitch Response Elicited and Palpable Increase in Muscle Length    09/29/22 Therapeutic Exercise: to improve strength and mobility.  Demo, verbal and tactile cues throughout for technique. Nustep L5 x 6 min Standing lumbar extension 10x3" Prone on elbows x 1 min Prone knee bends 2x10 bil Prone hip extension 2x10  bil Prone arm raises 2x5 bil Seated lumbar extension with green TB (doubled) x 10  Standing rows green TB 2x10  Standing shoulder extension green TB 2x10  09/27/22 Therapeutic Exercise: to improve strength and mobility.  Demo, verbal and tactile cues throughout for technique. Nustep L5 x 6 min  Lateral shifts at wall x 10 bil  Back extensions at counter x 10 Prone knee bends Prone legs extensions 3 x 10 bil  Prone press-ups (on elbows)  2 x 10 Manual Therapy: to decrease muscle spasm and pain and improve mobility STM/TPR to L glutes, QL, lumbar paraspinals, L UPA mobs lumbar spine.    PATIENT EDUCATION:  Education details: education on pain neuroscience  Person educated: Patient Education method: Explanation Education comprehension: verbalized understanding  HOME EXERCISE PROGRAM: Access Code: [email or web address] URL: [email or web address] Date: 09/29/2022 Prepared by: Verta Ellen  Exercises - Lateral Shift Correction at Wall  - 5 x daily - 7 x weekly - 1 sets - 10 reps - Standing Lumbar Extension with Counter  - 5 x daily - 7 x weekly - 1 sets - 10 reps - Prone Knee Flexion  - 1 x daily - 7 x weekly - 1 sets - 10 reps - Prone Hip Extension  - 1 x daily - 7 x weekly - 3 sets - 10 reps - Prone Press Up  - 1 x daily - 7 x weekly - 1 sets - 10 reps - Prone Alternating Arm and Leg Lifts  - 1 x daily - 7 x weekly - 3 sets - 10 reps  ASSESSMENT:  CLINICAL IMPRESSION: Today performed attended estim to multifidi after education on reasoning behind estim directly to multifidi, due to scoliosis only stimulated at 1 level, she tolerated well, the followed by exercise to utilize multifidi.  Also worked on hip strengthening, as continues to have L hip pain and weakness.  Yentl Rudesill Chisholm continues to demonstrate potential for improvement and would benefit from continued skilled therapy to address impairments.       OBJECTIVE IMPAIRMENTS: Abnormal gait, decreased activity tolerance,  decreased endurance, decreased mobility, difficulty walking, decreased ROM, decreased strength, hypomobility, increased fascial restrictions, increased muscle spasms, impaired flexibility, impaired sensation, and pain.   ACTIVITY LIMITATIONS: carrying, lifting, bending, sitting, standing, sleeping, stairs, transfers, bed mobility, and locomotion level  PARTICIPATION LIMITATIONS: occupation  PERSONAL FACTORS: Past/current experiences, Time since onset of injury/illness/exacerbation, and 3+ comorbidities: Arthritis, chronic LBP, DM (poorly controlled), GERD, HTN, Obestiy, hypothyroidism, bil THR (L revised), abdominal hysterectomy and hernia repair.   are also affecting patient's functional outcome.   REHAB POTENTIAL: Good  CLINICAL DECISION MAKING: Evolving/moderate complexity  EVALUATION COMPLEXITY: Moderate    GOALS: Goals reviewed with patient? Yes  SHORT TERM GOALS: Target date: 10/05/2022   Patient will be independent with initial HEP.  Baseline:  Goal status: MET- 09/29/22  2.  Patient will report centralization of radicular symptoms.  Baseline:  Goal status: IN PROGRESS  LONG  TERM GOALS: Target date: 11/02/2022    Patient will be independent with advanced/ongoing HEP to improve outcomes and carryover.  Baseline:  Goal status: IN PROGRESS  2.  Patient will report 75% improvement in low back pain to improve QOL.  Baseline:  Goal status: IN PROGRESS  3.  Patient will demonstrate full pain free lumbar ROM to perform ADLs.   Baseline: see objective Goal status: IN PROGRESS  4.  Patient will demonstrate improved functional strength as demonstrated by 5/5 LLE strength. Baseline: see objective Goal status: IN PROGRESS  5.  Patient will report 6 points improvement on modified Oswestry demonstrate improved functional ability.  Baseline: 27/50 Goal status: IN PROGRESS   6.  Patient will be able to sleep without interruption from LBP. Baseline: waking and having to sleep in  recliner Goal status: IN PROGRESS  PLAN:  PT FREQUENCY: 1-2x/week  PT DURATION: 6 weeks  PLANNED INTERVENTIONS: Therapeutic exercises, Therapeutic activity, Neuromuscular re-education, Balance training, Gait training, Patient/Family education, Self Care, Joint mobilization, Joint manipulation, Dry Needling, Electrical stimulation, Spinal manipulation, Spinal mobilization, Cryotherapy, Moist heat, Taping, Traction, Ultrasound, Manual therapy, and Re-evaluation.  PLAN FOR NEXT SESSION: See how she tolerated TrDN with Estim.  Add sciatic nerve glides too.  Progress Mckenzie and core strengthening exercises.  May traction to centralize symptoms.    Jena Gauss, PT 10/11/2022, 9:54 AM

## 2022-10-11 NOTE — Patient Instructions (Signed)

## 2022-10-14 ENCOUNTER — Ambulatory Visit: Payer: 59

## 2022-10-14 DIAGNOSIS — M6281 Muscle weakness (generalized): Secondary | ICD-10-CM

## 2022-10-14 DIAGNOSIS — R293 Abnormal posture: Secondary | ICD-10-CM

## 2022-10-14 DIAGNOSIS — M5442 Lumbago with sciatica, left side: Secondary | ICD-10-CM | POA: Diagnosis not present

## 2022-10-14 DIAGNOSIS — M62830 Muscle spasm of back: Secondary | ICD-10-CM

## 2022-10-14 DIAGNOSIS — M5459 Other low back pain: Secondary | ICD-10-CM

## 2022-10-14 NOTE — Therapy (Signed)
OUTPATIENT PHYSICAL THERAPY TREATMENT   Patient Name: Margaret Carey MRN: [id number] DOB:[birth year], 62 y.o., female Today's Date: 10/14/2022  END OF SESSION:  PT End of Session - 10/14/22 1741     Visit Number 5    Number of Visits 12    Date for PT Re-Evaluation 11/02/22    Authorization Type [employer] VL: [id number]    PT Start Time 1706    PT Stop Time 1750    PT Time Calculation (min) 44 min    Activity Tolerance Patient tolerated treatment well    Behavior During Therapy WFL for tasks assessed/performed               Past Medical History:  Diagnosis Date   Allergy    Arthritis    Chronic kidney disease    kidney failure 02/2018   Diabetes mellitus    GERD (gastroesophageal reflux disease)    Hypertension    Obese    Sleep apnea    no cpap   Thyroid disease    Hypothyroid   Past Surgical History:  Procedure Laterality Date   ABDOMINAL HYSTERECTOMY  2011   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   CERVICAL CONE BIOPSY  1990   HERNIA REPAIR     HIP SURGERY     Bilateral replacement   IRRIGATION AND DEBRIDEMENT ABSCESS N/A 01/22/2018   Procedure: IRRIGATION AND DEBRIDEMENT BACK ABSCESS;  Surgeon: Glenna Fellows, MD;  Location: [facility];  Service: General;  Laterality: N/A;   OOPHORECTOMY     LSO and RSO   Patient Active Problem List   Diagnosis Date Noted   Abscess of back 01/22/2018   Severe sepsis with septic shock (HCC) 01/22/2018   Sepsis (HCC) 01/21/2018   AKI (acute kidney injury) (HCC) 01/21/2018   Hyponatremia 01/21/2018   DM (diabetes mellitus) (HCC) 01/21/2018   Thyroid disease    Hypertension     PCP: Creola Corn, MD   REFERRING PROVIDER: Lisbeth Renshaw, MD  REFERRING DIAG: M51.36 (ICD-10-CM) - Other intervertebral disc degeneration, lumbar region  Rationale for Evaluation and Treatment: Rehabilitation  THERAPY DIAG:  Acute left-sided low back pain with left-sided sciatica  Muscle weakness (generalized)  Muscle spasm of back  Other low back  pain  Abnormal posture  ONSET DATE: 08/11/2022  SUBJECTIVE:                                                                                                                                                                                           SUBJECTIVE STATEMENT: Pt reports MRI result show some arthritis and scoliosis.  PERTINENT HISTORY:  Arthritis, chronic LBP, DM (poorly controlled), GERD,  HTN, Obestiy, hypothyroidism, bil THR (L revised), abdominal hysterectomy and hernia repair.   PAIN:  Are you having pain? Yes: NPRS scale: 5-6/10 Pain location: across low back; leg pain at night Pain description: at end of day 10-12/10 Aggravating factors: walking, sitting prolonged periods, laying down (has to switch between bed and recliner) Relieving factors: diclofenac  PRECAUTIONS: None  WEIGHT BEARING RESTRICTIONS: No  FALLS:  Has patient fallen in last 6 months? No  LIVING ENVIRONMENT: Lives with: lives alone Lives in: House/apartment Stairs: Yes: External: 3 steps; none Has following equipment at home: None  OCCUPATION: lab tech  PLOF: Independent  PATIENT GOALS: decrease pain   NEXT MD VISIT: October 14, 2022  OBJECTIVE:   DIAGNOSTIC FINDINGS:  MRI scheduled on Tuesday  PATIENT SURVEYS:  Modified Oswestry 27/50 = 54%   SCREENING FOR RED FLAGS: Bowel or bladder incontinence: No Spinal tumors: No Cauda equina syndrome: No Compression fracture: No Abdominal aneurysm: No  COGNITION: Overall cognitive status: Within functional limits for tasks assessed     SENSATION: Numbness and tingling radiating to bottom of L foot  MUSCLE LENGTH: Hamstrings: Right 90 deg; Left -limited to 30 deg today by pain Quadricepst: moderate tightness bil  POSTURE: rounded shoulders and forward head  PALPATION: Tenderness with PA mobs in lumbar spine, L QL, glutes  LUMBAR ROM:   AROM eval  Flexion To knees, P!  Extension Limited 75%  Right lateral flexion To knee  Left  lateral flexion Limited 75%, p!  Right rotation   Left rotation    (Blank rows = not tested)  LOWER EXTREMITY ROM:   deferred hip ROM today due to pain  LOWER EXTREMITY MMT:    MMT Right eval Left eval  Hip flexion 5 4  Hip extension    Hip abduction 5 5  Hip adduction 5 5  Knee flexion 5 4+p!  Knee extension 5 4p!  Ankle dorsiflexion 5 4+  Ankle plantarflexion 5 4+   (Blank rows = not tested)  LUMBAR SPECIAL TESTS:  Straight leg raise test: Positive left  GAIT: Distance walked: 50 Comments: no AD, antalgic  TODAY'S TREATMENT:                                                                                                                              DATE:  10/14/22 Therapeutic Exercise: to improve strength and mobility.  Demo, verbal and tactile cues throughout for technique. Nustep L5x50min Seated lumbar flexion with green Pball 3 way x 10 each: progressed to 30 second hold fwd 2x Prone hip extension x 10 bil  Quadruped arm raise x 10  Quadruped hip raises x 10 bil Wall push ups x 20  Wall slides for trunk extension  x10  Lateral shift at wall  10/11/2022 Therapeutic Exercise: to improve strength and mobility.  Demo, verbal and tactile cues throughout for technique. Bike L2 x 6 min  Prone leg extension 3 x 10 bil (after Estim  to mutlfidi) Seated "rainbow knees" x 10 bil - very difficult on L Standing open doors x 10 bil   Attended Estim: to stimulate multifidi to decrease low back pain using TrDN to directly stimulate muscles.  Trigger Point Dry-Needling  Treatment instructions: Expect mild to moderate muscle soreness. S/S of pneumothorax if dry needled over a lung field, and to seek immediate medical attention should they occur. Patient verbalized understanding of these instructions and education. Patient Consent Given: Yes Education handout provided: Yes Muscles treated: L2 multifidi Electrical stimulation performed: Yes Parameters:  30 milliamps, frequency high  enough to elicit twitch x 5 min  Treatment response/outcome: Twitch Response Elicited and Palpable Increase in Muscle Length    09/29/22 Therapeutic Exercise: to improve strength and mobility.  Demo, verbal and tactile cues throughout for technique. Nustep L5 x 6 min Standing lumbar extension 10x3" Prone on elbows x 1 min Prone knee bends 2x10 bil Prone hip extension 2x10 bil Prone arm raises 2x5 bil Seated lumbar extension with green TB (doubled) x 10  Standing rows green TB 2x10  Standing shoulder extension green TB 2x10  09/27/22 Therapeutic Exercise: to improve strength and mobility.  Demo, verbal and tactile cues throughout for technique. Nustep L5 x 6 min  Lateral shifts at wall x 10 bil  Back extensions at counter x 10 Prone knee bends Prone legs extensions 3 x 10 bil  Prone press-ups (on elbows)  2 x 10 Manual Therapy: to decrease muscle spasm and pain and improve mobility STM/TPR to L glutes, QL, lumbar paraspinals, L UPA mobs lumbar spine.    PATIENT EDUCATION:  Education details: education on pain neuroscience  Person educated: Patient Education method: Explanation Education comprehension: verbalized understanding  HOME EXERCISE PROGRAM: Access Code: [email or web address] URL: [email or web address] Date: 09/29/2022 Prepared by: Verta Ellen  Exercises - Lateral Shift Correction at Wall  - 5 x daily - 7 x weekly - 1 sets - 10 reps - Standing Lumbar Extension with Counter  - 5 x daily - 7 x weekly - 1 sets - 10 reps - Prone Knee Flexion  - 1 x daily - 7 x weekly - 1 sets - 10 reps - Prone Hip Extension  - 1 x daily - 7 x weekly - 3 sets - 10 reps - Prone Press Up  - 1 x daily - 7 x weekly - 1 sets - 10 reps - Prone Alternating Arm and Leg Lifts  - 1 x daily - 7 x weekly - 3 sets - 10 reps  ASSESSMENT:  CLINICAL IMPRESSION: Pt arrived with increased LBP noted from work today. Pt reports scoliosis and arthritis in spine per MRI results. Worked on stretching,  mobility, and gentle lumbar strengthening. Cues to keep spine and pelvis neutral with quadruped exercises. Cues also to avoid rotating the pelvis with hip extension. She notes good response from DN + estim.  She declined MT today. [employer] continues to demonstrate potential for improvement and would benefit from continued skilled therapy to address impairments.       OBJECTIVE IMPAIRMENTS: Abnormal gait, decreased activity tolerance, decreased endurance, decreased mobility, difficulty walking, decreased ROM, decreased strength, hypomobility, increased fascial restrictions, increased muscle spasms, impaired flexibility, impaired sensation, and pain.   ACTIVITY LIMITATIONS: carrying, lifting, bending, sitting, standing, sleeping, stairs, transfers, bed mobility, and locomotion level  PARTICIPATION LIMITATIONS: occupation  PERSONAL FACTORS: Past/current experiences, Time since onset of injury/illness/exacerbation, and 3+ comorbidities: Arthritis, chronic LBP, DM (poorly controlled), GERD, HTN, Obestiy, hypothyroidism, bil THR (L  revised), abdominal hysterectomy and hernia repair.   are also affecting patient's functional outcome.   REHAB POTENTIAL: Good  CLINICAL DECISION MAKING: Evolving/moderate complexity  EVALUATION COMPLEXITY: Moderate    GOALS: Goals reviewed with patient? Yes  SHORT TERM GOALS: Target date: 10/05/2022   Patient will be independent with initial HEP.  Baseline:  Goal status: MET- 09/29/22  2.  Patient will report centralization of radicular symptoms.  Baseline:  Goal status: IN PROGRESS- 10/14/22 occasional N/T   LONG TERM GOALS: Target date: 11/02/2022    Patient will be independent with advanced/ongoing HEP to improve outcomes and carryover.  Baseline:  Goal status: IN PROGRESS  2.  Patient will report 75% improvement in low back pain to improve QOL.  Baseline:  Goal status: IN PROGRESS  3.  Patient will demonstrate full pain free lumbar ROM to perform  ADLs.   Baseline: see objective Goal status: IN PROGRESS  4.  Patient will demonstrate improved functional strength as demonstrated by 5/5 LLE strength. Baseline: see objective Goal status: IN PROGRESS  5.  Patient will report 6 points improvement on modified Oswestry demonstrate improved functional ability.  Baseline: 27/50 Goal status: IN PROGRESS   6.  Patient will be able to sleep without interruption from LBP. Baseline: waking and having to sleep in recliner Goal status: IN PROGRESS  PLAN:  PT FREQUENCY: 1-2x/week  PT DURATION: 6 weeks  PLANNED INTERVENTIONS: Therapeutic exercises, Therapeutic activity, Neuromuscular re-education, Balance training, Gait training, Patient/Family education, Self Care, Joint mobilization, Joint manipulation, Dry Needling, Electrical stimulation, Spinal manipulation, Spinal mobilization, Cryotherapy, Moist heat, Taping, Traction, Ultrasound, Manual therapy, and Re-evaluation.  PLAN FOR NEXT SESSION: Add sciatic nerve glides too.  Progress Mckenzie and core strengthening exercises.  May traction to centralize symptoms.    Darleene Cleaver, PTA 10/14/2022, 6:05 PM

## 2022-10-18 ENCOUNTER — Ambulatory Visit: Payer: 59

## 2022-10-19 ENCOUNTER — Ambulatory Visit: Payer: 59

## 2022-10-19 DIAGNOSIS — M5442 Lumbago with sciatica, left side: Secondary | ICD-10-CM | POA: Diagnosis not present

## 2022-10-19 DIAGNOSIS — M62830 Muscle spasm of back: Secondary | ICD-10-CM

## 2022-10-19 DIAGNOSIS — M6281 Muscle weakness (generalized): Secondary | ICD-10-CM

## 2022-10-19 NOTE — Therapy (Signed)
OUTPATIENT PHYSICAL THERAPY TREATMENT   Patient Name: Margaret Carey MRN: [id number] DOB:[birth year], 62 y.o., female Today's Date: 10/19/2022  END OF SESSION:  PT End of Session - 10/19/22 1537     Visit Number 6    Number of Visits 12    Date for PT Re-Evaluation 11/02/22    Authorization Type [employer] VL: [id number]    PT Start Time 1531    PT Stop Time 1613    PT Time Calculation (min) 42 min    Activity Tolerance Patient tolerated treatment well    Behavior During Therapy WFL for tasks assessed/performed                Past Medical History:  Diagnosis Date   Allergy    Arthritis    Chronic kidney disease    kidney failure 02/2018   Diabetes mellitus    GERD (gastroesophageal reflux disease)    Hypertension    Obese    Sleep apnea    no cpap   Thyroid disease    Hypothyroid   Past Surgical History:  Procedure Laterality Date   ABDOMINAL HYSTERECTOMY  2011   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   CERVICAL CONE BIOPSY  1990   HERNIA REPAIR     HIP SURGERY     Bilateral replacement   IRRIGATION AND DEBRIDEMENT ABSCESS N/A 01/22/2018   Procedure: IRRIGATION AND DEBRIDEMENT BACK ABSCESS;  Surgeon: Glenna Fellows, MD;  Location: [facility];  Service: General;  Laterality: N/A;   OOPHORECTOMY     LSO and RSO   Patient Active Problem List   Diagnosis Date Noted   Abscess of back 01/22/2018   Severe sepsis with septic shock (HCC) 01/22/2018   Sepsis (HCC) 01/21/2018   AKI (acute kidney injury) (HCC) 01/21/2018   Hyponatremia 01/21/2018   DM (diabetes mellitus) (HCC) 01/21/2018   Thyroid disease    Hypertension     PCP: Creola Corn, MD   REFERRING PROVIDER: Lisbeth Renshaw, MD  REFERRING DIAG: M51.36 (ICD-10-CM) - Other intervertebral disc degeneration, lumbar region  Rationale for Evaluation and Treatment: Rehabilitation  THERAPY DIAG:  Acute left-sided low back pain with left-sided sciatica  Muscle weakness (generalized)  Muscle spasm of back  ONSET DATE:  08/11/2022  SUBJECTIVE:                                                                                                                                                                                           SUBJECTIVE STATEMENT: Pt reports doing some exercises in the pool, went well while there but was sore afterwards.   PERTINENT HISTORY:  Arthritis, chronic LBP, DM (poorly controlled),  GERD, HTN, Obestiy, hypothyroidism, bil THR (L revised), abdominal hysterectomy and hernia repair.   PAIN:  Are you having pain? Yes: NPRS scale: 5-6/10 Pain location: across low back; leg pain at night Pain description: at end of day 10-12/10 Aggravating factors: walking, sitting prolonged periods, laying down (has to switch between bed and recliner) Relieving factors: diclofenac  PRECAUTIONS: None  WEIGHT BEARING RESTRICTIONS: No  FALLS:  Has patient fallen in last 6 months? No  LIVING ENVIRONMENT: Lives with: lives alone Lives in: House/apartment Stairs: Yes: External: 3 steps; none Has following equipment at home: None  OCCUPATION: lab tech  PLOF: Independent  PATIENT GOALS: decrease pain   NEXT MD VISIT: October 14, 2022  OBJECTIVE:   DIAGNOSTIC FINDINGS:  MRI scheduled on Tuesday  PATIENT SURVEYS:  Modified Oswestry 27/50 = 54%   SCREENING FOR RED FLAGS: Bowel or bladder incontinence: No Spinal tumors: No Cauda equina syndrome: No Compression fracture: No Abdominal aneurysm: No  COGNITION: Overall cognitive status: Within functional limits for tasks assessed     SENSATION: Numbness and tingling radiating to bottom of L foot  MUSCLE LENGTH: Hamstrings: Right 90 deg; Left -limited to 30 deg today by pain Quadricepst: moderate tightness bil  POSTURE: rounded shoulders and forward head  PALPATION: Tenderness with PA mobs in lumbar spine, L QL, glutes  LUMBAR ROM:   AROM eval  Flexion To knees, P!  Extension Limited 75%  Right lateral flexion To knee  Left  lateral flexion Limited 75%, p!  Right rotation   Left rotation    (Blank rows = not tested)  LOWER EXTREMITY ROM:   deferred hip ROM today due to pain  LOWER EXTREMITY MMT:    MMT Right eval Left eval  Hip flexion 5 4  Hip extension    Hip abduction 5 5  Hip adduction 5 5  Knee flexion 5 4+p!  Knee extension 5 4p!  Ankle dorsiflexion 5 4+  Ankle plantarflexion 5 4+   (Blank rows = not tested)  LUMBAR SPECIAL TESTS:  Straight leg raise test: Positive left  GAIT: Distance walked: 50 Comments: no AD, antalgic  TODAY'S TREATMENT:                                                                                                                              DATE:  10/19/22 Therapeutic Exercise: to improve strength and mobility.  Demo, verbal and tactile cues throughout for technique. Nustep L5x5min Supine bil quad stretch 3x30 sec with strap Hook-lying hip ADD with TrA engagement 10x5" Hook-lying TrA with march x 8 Hooky-lying TrA with shld ext RTB x 10 Gentle STM to lumbar PS and glutes  10/14/22 Therapeutic Exercise: to improve strength and mobility.  Demo, verbal and tactile cues throughout for technique. Nustep L5x64min Seated lumbar flexion with green Pball 3 way x 10 each: progressed to 30 second hold fwd 2x Prone hip extension x 10 bil  Quadruped arm raise x 10  Quadruped hip raises x 10 bil Wall push ups x 20  Wall slides for trunk extension  x10  Lateral shift at wall  10/11/2022 Therapeutic Exercise: to improve strength and mobility.  Demo, verbal and tactile cues throughout for technique. Bike L2 x 6 min  Prone leg extension 3 x 10 bil (after Estim to mutlfidi) Seated "rainbow knees" x 10 bil - very difficult on L Standing open doors x 10 bil   Attended Estim: to stimulate multifidi to decrease low back pain using TrDN to directly stimulate muscles.  Trigger Point Dry-Needling  Treatment instructions: Expect mild to moderate muscle soreness. S/S of pneumothorax  if dry needled over a lung field, and to seek immediate medical attention should they occur. Patient verbalized understanding of these instructions and education. Patient Consent Given: Yes Education handout provided: Yes Muscles treated: L2 multifidi Electrical stimulation performed: Yes Parameters:  30 milliamps, frequency high enough to elicit twitch x 5 min  Treatment response/outcome: Twitch Response Elicited and Palpable Increase in Muscle Length    09/29/22 Therapeutic Exercise: to improve strength and mobility.  Demo, verbal and tactile cues throughout for technique. Nustep L5 x 6 min Standing lumbar extension 10x3" Prone on elbows x 1 min Prone knee bends 2x10 bil Prone hip extension 2x10 bil Prone arm raises 2x5 bil Seated lumbar extension with green TB (doubled) x 10  Standing rows green TB 2x10  Standing shoulder extension green TB 2x10  09/27/22 Therapeutic Exercise: to improve strength and mobility.  Demo, verbal and tactile cues throughout for technique. Nustep L5 x 6 min  Lateral shifts at wall x 10 bil  Back extensions at counter x 10 Prone knee bends Prone legs extensions 3 x 10 bil  Prone press-ups (on elbows)  2 x 10 Manual Therapy: to decrease muscle spasm and pain and improve mobility STM/TPR to L glutes, QL, lumbar paraspinals, L UPA mobs lumbar spine.    PATIENT EDUCATION:  Education details: education on pain neuroscience  Person educated: Patient Education method: Explanation Education comprehension: verbalized understanding  HOME EXERCISE PROGRAM: Access Code: [email or web address] URL: [email or web address] Date: 09/29/2022 Prepared by: Verta Ellen  Exercises - Lateral Shift Correction at Wall  - 5 x daily - 7 x weekly - 1 sets - 10 reps - Standing Lumbar Extension with Counter  - 5 x daily - 7 x weekly - 1 sets - 10 reps - Prone Knee Flexion  - 1 x daily - 7 x weekly - 1 sets - 10 reps - Prone Hip Extension  - 1 x daily - 7 x weekly - 3  sets - 10 reps - Prone Press Up  - 1 x daily - 7 x weekly - 1 sets - 10 reps - Prone Alternating Arm and Leg Lifts  - 1 x daily - 7 x weekly - 3 sets - 10 reps  ASSESSMENT:  CLINICAL IMPRESSION: Progressed exercises to tolerance. Added quad stretch to HEP to address tightness noted from patient pre-session. She demonstrated a good performance of the exercises. She showed more difficulty with marching with RLE vs LLE. She continues to have increased pain after full day of work. Concluded with gentle manual to address pain and stiffness in low back musculature. [employer] continues to demonstrate potential for improvement and would benefit from continued skilled therapy to address impairments.       OBJECTIVE IMPAIRMENTS: Abnormal gait, decreased activity tolerance, decreased endurance, decreased mobility, difficulty walking, decreased ROM, decreased strength, hypomobility, increased fascial restrictions, increased  muscle spasms, impaired flexibility, impaired sensation, and pain.   ACTIVITY LIMITATIONS: carrying, lifting, bending, sitting, standing, sleeping, stairs, transfers, bed mobility, and locomotion level  PARTICIPATION LIMITATIONS: occupation  PERSONAL FACTORS: Past/current experiences, Time since onset of injury/illness/exacerbation, and 3+ comorbidities: Arthritis, chronic LBP, DM (poorly controlled), GERD, HTN, Obestiy, hypothyroidism, bil THR (L revised), abdominal hysterectomy and hernia repair.   are also affecting patient's functional outcome.   REHAB POTENTIAL: Good  CLINICAL DECISION MAKING: Evolving/moderate complexity  EVALUATION COMPLEXITY: Moderate    GOALS: Goals reviewed with patient? Yes  SHORT TERM GOALS: Target date: 10/05/2022   Patient will be independent with initial HEP.  Baseline:  Goal status: MET- 09/29/22  2.  Patient will report centralization of radicular symptoms.  Baseline:  Goal status: IN PROGRESS- 10/14/22 occasional N/T   LONG TERM  GOALS: Target date: 11/02/2022    Patient will be independent with advanced/ongoing HEP to improve outcomes and carryover.  Baseline:  Goal status: IN PROGRESS  2.  Patient will report 75% improvement in low back pain to improve QOL.  Baseline:  Goal status: IN PROGRESS  3.  Patient will demonstrate full pain free lumbar ROM to perform ADLs.   Baseline: see objective Goal status: IN PROGRESS  4.  Patient will demonstrate improved functional strength as demonstrated by 5/5 LLE strength. Baseline: see objective Goal status: IN PROGRESS  5.  Patient will report 6 points improvement on modified Oswestry demonstrate improved functional ability.  Baseline: 27/50 Goal status: IN PROGRESS   6.  Patient will be able to sleep without interruption from LBP. Baseline: waking and having to sleep in recliner Goal status: IN PROGRESS  PLAN:  PT FREQUENCY: 1-2x/week  PT DURATION: 6 weeks  PLANNED INTERVENTIONS: Therapeutic exercises, Therapeutic activity, Neuromuscular re-education, Balance training, Gait training, Patient/Family education, Self Care, Joint mobilization, Joint manipulation, Dry Needling, Electrical stimulation, Spinal manipulation, Spinal mobilization, Cryotherapy, Moist heat, Taping, Traction, Ultrasound, Manual therapy, and Re-evaluation.  PLAN FOR NEXT SESSION: Add sciatic nerve glides too.  Progress Mckenzie and core strengthening exercises.  May traction to centralize symptoms.    Darleene Cleaver, PTA 10/19/2022, 4:19 PM

## 2022-10-21 ENCOUNTER — Ambulatory Visit: Payer: 59

## 2022-10-21 DIAGNOSIS — M5459 Other low back pain: Secondary | ICD-10-CM

## 2022-10-21 DIAGNOSIS — M6281 Muscle weakness (generalized): Secondary | ICD-10-CM

## 2022-10-21 DIAGNOSIS — M62830 Muscle spasm of back: Secondary | ICD-10-CM

## 2022-10-21 DIAGNOSIS — M5442 Lumbago with sciatica, left side: Secondary | ICD-10-CM | POA: Diagnosis not present

## 2022-10-21 DIAGNOSIS — R293 Abnormal posture: Secondary | ICD-10-CM

## 2022-10-21 NOTE — Therapy (Signed)
OUTPATIENT PHYSICAL THERAPY TREATMENT   Patient Name: Margaret Carey MRN: [id number] DOB:November 04, 1960, 62 y.o., female Today's Date: 10/21/2022  END OF SESSION:  PT End of Session - 10/21/22 1733     Visit Number 7    Number of Visits 12    Date for PT Re-Evaluation 11/02/22    Authorization Type [employer] VL: [id number]    PT Start Time 1705    PT Stop Time 1747    PT Time Calculation (min) 42 min    Activity Tolerance Patient tolerated treatment well    Behavior During Therapy WFL for tasks assessed/performed                 Past Medical History:  Diagnosis Date   Allergy    Arthritis    Chronic kidney disease    kidney failure 02/2018   Diabetes mellitus    GERD (gastroesophageal reflux disease)    Hypertension    Obese    Sleep apnea    no cpap   Thyroid disease    Hypothyroid   Past Surgical History:  Procedure Laterality Date   ABDOMINAL HYSTERECTOMY  2011   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   CERVICAL CONE BIOPSY  1990   HERNIA REPAIR     HIP SURGERY     Bilateral replacement   IRRIGATION AND DEBRIDEMENT ABSCESS N/A 01/22/2018   Procedure: IRRIGATION AND DEBRIDEMENT BACK ABSCESS;  Surgeon: Glenna Fellows, MD;  Location: [facility];  Service: General;  Laterality: N/A;   OOPHORECTOMY     LSO and RSO   Patient Active Problem List   Diagnosis Date Noted   Abscess of back 01/22/2018   Severe sepsis with septic shock (HCC) 01/22/2018   Sepsis (HCC) 01/21/2018   AKI (acute kidney injury) (HCC) 01/21/2018   Hyponatremia 01/21/2018   DM (diabetes mellitus) (HCC) 01/21/2018   Thyroid disease    Hypertension     PCP: Creola Corn, MD   REFERRING PROVIDER: Lisbeth Renshaw, MD  REFERRING DIAG: M51.36 (ICD-10-CM) - Other intervertebral disc degeneration, lumbar region  Rationale for Evaluation and Treatment: Rehabilitation  THERAPY DIAG:  Acute left-sided low back pain with left-sided sciatica  Muscle weakness (generalized)  Muscle spasm of back  Other low  back pain  Abnormal posture  ONSET DATE: 08/11/2022  SUBJECTIVE:                                                                                                                                                                                           SUBJECTIVE STATEMENT: Some pain from work today.  PERTINENT HISTORY:  Arthritis, chronic LBP, DM (poorly controlled), GERD, HTN, Obestiy,  hypothyroidism, bil THR (L revised), abdominal hysterectomy and hernia repair.   PAIN:  Are you having pain? Yes: NPRS scale: 4/10 Pain location: across low back; leg pain at night Pain description:   Aggravating factors: walking, sitting prolonged periods, laying down (has to switch between bed and recliner) Relieving factors: diclofenac  PRECAUTIONS: None  WEIGHT BEARING RESTRICTIONS: No  FALLS:  Has patient fallen in last 6 months? No  LIVING ENVIRONMENT: Lives with: lives alone Lives in: House/apartment Stairs: Yes: External: 3 steps; none Has following equipment at home: None  OCCUPATION: lab tech  PLOF: Independent  PATIENT GOALS: decrease pain   NEXT MD VISIT: October 14, 2022  OBJECTIVE:   DIAGNOSTIC FINDINGS:  MRI scheduled on Tuesday  PATIENT SURVEYS:  Modified Oswestry 27/50 = 54%   SCREENING FOR RED FLAGS: Bowel or bladder incontinence: No Spinal tumors: No Cauda equina syndrome: No Compression fracture: No Abdominal aneurysm: No  COGNITION: Overall cognitive status: Within functional limits for tasks assessed     SENSATION: Numbness and tingling radiating to bottom of L foot  MUSCLE LENGTH: Hamstrings: Right 90 deg; Left -limited to 30 deg today by pain Quadricepst: moderate tightness bil  POSTURE: rounded shoulders and forward head  PALPATION: Tenderness with PA mobs in lumbar spine, L QL, glutes  LUMBAR ROM:   AROM eval 10/21/22  Flexion To knees, P! Mid leg  Extension Limited 75% 50% limited   Right lateral flexion To knee Distal thigh  Left  lateral flexion Limited 75%, p! Distal thigh slightly further down than R side  Right rotation    Left rotation     (Blank rows = not tested)  LOWER EXTREMITY ROM:   deferred hip ROM today due to pain  LOWER EXTREMITY MMT:    MMT Right eval Left eval  Hip flexion 5 4  Hip extension    Hip abduction 5 5  Hip adduction 5 5  Knee flexion 5 4+p!  Knee extension 5 4p!  Ankle dorsiflexion 5 4+  Ankle plantarflexion 5 4+   (Blank rows = not tested)  LUMBAR SPECIAL TESTS:  Straight leg raise test: Positive left  GAIT: Distance walked: 50 Comments: no AD, antalgic  TODAY'S TREATMENT:                                                                                                                              DATE:  10/21/22 Therapeutic Exercise: to improve strength and mobility.  Demo, verbal and tactile cues throughout for technique. Nustep L5x32min Lumbar AROM Pelvic tilts Supine ab sets 10x5" Supine dead bug (modified) 10x5" Shoulder ext GTB x 20  Seated deadlift x 10   10/19/22 Therapeutic Exercise: to improve strength and mobility.  Demo, verbal and tactile cues throughout for technique. Nustep L5x70min Supine bil quad stretch 3x30 sec with strap Hook-lying hip ADD with TrA engagement 10x5" Hook-lying TrA with march x 8 Hooky-lying TrA with shld ext RTB x 10 Gentle STM  to lumbar PS and glutes  10/14/22 Therapeutic Exercise: to improve strength and mobility.  Demo, verbal and tactile cues throughout for technique. Nustep L5x13min Seated lumbar flexion with green Pball 3 way x 10 each: progressed to 30 second hold fwd 2x Prone hip extension x 10 bil  Quadruped arm raise x 10  Quadruped hip raises x 10 bil Wall push ups x 20  Wall slides for trunk extension  x10  Lateral shift at wall  10/11/2022 Therapeutic Exercise: to improve strength and mobility.  Demo, verbal and tactile cues throughout for technique. Bike L2 x 6 min  Prone leg extension 3 x 10 bil (after Estim to  mutlfidi) Seated "rainbow knees" x 10 bil - very difficult on L Standing open doors x 10 bil   Attended Estim: to stimulate multifidi to decrease low back pain using TrDN to directly stimulate muscles.  Trigger Point Dry-Needling  Treatment instructions: Expect mild to moderate muscle soreness. S/S of pneumothorax if dry needled over a lung field, and to seek immediate medical attention should they occur. Patient verbalized understanding of these instructions and education. Patient Consent Given: Yes Education handout provided: Yes Muscles treated: L2 multifidi Electrical stimulation performed: Yes Parameters:  30 milliamps, frequency high enough to elicit twitch x 5 min  Treatment response/outcome: Twitch Response Elicited and Palpable Increase in Muscle Length    09/29/22 Therapeutic Exercise: to improve strength and mobility.  Demo, verbal and tactile cues throughout for technique. Nustep L5 x 6 min Standing lumbar extension 10x3" Prone on elbows x 1 min Prone knee bends 2x10 bil Prone hip extension 2x10 bil Prone arm raises 2x5 bil Seated lumbar extension with green TB (doubled) x 10  Standing rows green TB 2x10  Standing shoulder extension green TB 2x10  09/27/22 Therapeutic Exercise: to improve strength and mobility.  Demo, verbal and tactile cues throughout for technique. Nustep L5 x 6 min  Lateral shifts at wall x 10 bil  Back extensions at counter x 10 Prone knee bends Prone legs extensions 3 x 10 bil  Prone press-ups (on elbows)  2 x 10 Manual Therapy: to decrease muscle spasm and pain and improve mobility STM/TPR to L glutes, QL, lumbar paraspinals, L UPA mobs lumbar spine.    PATIENT EDUCATION:  Education details: education on pain neuroscience  Person educated: Patient Education method: Explanation Education comprehension: verbalized understanding  HOME EXERCISE PROGRAM: Access Code: [email or web address] URL: [email or web address] Date:  09/29/2022 Prepared by: Verta Ellen  Exercises - Lateral Shift Correction at Wall  - 5 x daily - 7 x weekly - 1 sets - 10 reps - Standing Lumbar Extension with Counter  - 5 x daily - 7 x weekly - 1 sets - 10 reps - Prone Knee Flexion  - 1 x daily - 7 x weekly - 1 sets - 10 reps - Prone Hip Extension  - 1 x daily - 7 x weekly - 3 sets - 10 reps - Prone Press Up  - 1 x daily - 7 x weekly - 1 sets - 10 reps - Prone Alternating Arm and Leg Lifts  - 1 x daily - 7 x weekly - 3 sets - 10 reps  ASSESSMENT:  CLINICAL IMPRESSION:  Progress being made with Lumbar AROM, mild pain noted with extension. Worked on core stability with good carryover from patient. Her back started to get tight with the shld ext so we did some lumbar flexion stretching and seated deadlifts to calm down the stiffness. She is interested  in DN next visit as benefit noted. Bristal Steffy Ricciardi continues to demonstrate potential for improvement and would benefit from continued skilled therapy to address impairments.      OBJECTIVE IMPAIRMENTS: Abnormal gait, decreased activity tolerance, decreased endurance, decreased mobility, difficulty walking, decreased ROM, decreased strength, hypomobility, increased fascial restrictions, increased muscle spasms, impaired flexibility, impaired sensation, and pain.   ACTIVITY LIMITATIONS: carrying, lifting, bending, sitting, standing, sleeping, stairs, transfers, bed mobility, and locomotion level  PARTICIPATION LIMITATIONS: occupation  PERSONAL FACTORS: Past/current experiences, Time since onset of injury/illness/exacerbation, and 3+ comorbidities: Arthritis, chronic LBP, DM (poorly controlled), GERD, HTN, Obestiy, hypothyroidism, bil THR (L revised), abdominal hysterectomy and hernia repair.   are also affecting patient's functional outcome.   REHAB POTENTIAL: Good  CLINICAL DECISION MAKING: Evolving/moderate complexity  EVALUATION COMPLEXITY: Moderate    GOALS: Goals reviewed with  patient? Yes  SHORT TERM GOALS: Target date: 10/05/2022   Patient will be independent with initial HEP.  Baseline:  Goal status: MET- 09/29/22  2.  Patient will report centralization of radicular symptoms.  Baseline:  Goal status: IN PROGRESS- 10/14/22 occasional N/T   LONG TERM GOALS: Target date: 11/02/2022    Patient will be independent with advanced/ongoing HEP to improve outcomes and carryover.  Baseline:  Goal status: IN PROGRESS  2.  Patient will report 75% improvement in low back pain to improve QOL.  Baseline:  Goal status: IN PROGRESS  3.  Patient will demonstrate full pain free lumbar ROM to perform ADLs.   Baseline: see objective Goal status: IN PROGRESS- 10/21/22  4.  Patient will demonstrate improved functional strength as demonstrated by 5/5 LLE strength. Baseline: see objective Goal status: IN PROGRESS  5.  Patient will report 6 points improvement on modified Oswestry demonstrate improved functional ability.  Baseline: 27/50 Goal status: IN PROGRESS   6.  Patient will be able to sleep without interruption from LBP. Baseline: waking and having to sleep in recliner Goal status: IN PROGRESS  PLAN:  PT FREQUENCY: 1-2x/week  PT DURATION: 6 weeks  PLANNED INTERVENTIONS: Therapeutic exercises, Therapeutic activity, Neuromuscular re-education, Balance training, Gait training, Patient/Family education, Self Care, Joint mobilization, Joint manipulation, Dry Needling, Electrical stimulation, Spinal manipulation, Spinal mobilization, Cryotherapy, Moist heat, Taping, Traction, Ultrasound, Manual therapy, and Re-evaluation.  PLAN FOR NEXT SESSION: try DN with estim again; Add sciatic nerve glides too.  Progress Mckenzie and core strengthening exercises.  May traction to centralize symptoms.    Darleene Cleaver, PTA 10/21/2022, 6:10 PM

## 2022-10-25 ENCOUNTER — Encounter: Payer: 59 | Admitting: Physical Therapy

## 2022-10-28 ENCOUNTER — Encounter: Payer: Self-pay | Admitting: Physical Therapy

## 2022-10-28 ENCOUNTER — Ambulatory Visit: Payer: 59 | Admitting: Physical Therapy

## 2022-10-28 DIAGNOSIS — M5442 Lumbago with sciatica, left side: Secondary | ICD-10-CM

## 2022-10-28 DIAGNOSIS — M6281 Muscle weakness (generalized): Secondary | ICD-10-CM

## 2022-10-28 DIAGNOSIS — M62830 Muscle spasm of back: Secondary | ICD-10-CM

## 2022-10-28 NOTE — Therapy (Signed)
OUTPATIENT PHYSICAL THERAPY TREATMENT   Patient Name: JAYLEAH GARBERS MRN: [id number] DOB:19-Oct-1960, 62 y.o., female Today's Date: 10/28/2022  END OF SESSION:  PT End of Session - 10/28/22 0806     Visit Number 8    Number of Visits 12    Date for PT Re-Evaluation 11/02/22    Authorization Type [employer] VL: [id number]    PT Start Time 0801    PT Stop Time 0843    PT Time Calculation (min) 42 min    Activity Tolerance Patient tolerated treatment well    Behavior During Therapy [facility] for tasks assessed/performed                 Past Medical History:  Diagnosis Date   Allergy    Arthritis    Chronic kidney disease    kidney failure 02/2018   Diabetes mellitus    GERD (gastroesophageal reflux disease)    Hypertension    Obese    Sleep apnea    no cpap   Thyroid disease    Hypothyroid   Past Surgical History:  Procedure Laterality Date   ABDOMINAL HYSTERECTOMY  2011   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   CERVICAL CONE BIOPSY  1990   HERNIA REPAIR     HIP SURGERY     Bilateral replacement   IRRIGATION AND DEBRIDEMENT ABSCESS N/A 01/22/2018   Procedure: IRRIGATION AND DEBRIDEMENT BACK ABSCESS;  Surgeon: Glenna Fellows, MD;  Location: [facility];  Service: General;  Laterality: N/A;   OOPHORECTOMY     LSO and RSO   Patient Active Problem List   Diagnosis Date Noted   Abscess of back 01/22/2018   Severe sepsis with septic shock (HCC) 01/22/2018   Sepsis (HCC) 01/21/2018   AKI (acute kidney injury) (HCC) 01/21/2018   Hyponatremia 01/21/2018   DM (diabetes mellitus) (HCC) 01/21/2018   Thyroid disease    Hypertension     PCP: Creola Corn, MD   REFERRING PROVIDER: Lisbeth Renshaw, MD  REFERRING DIAG: M51.36 (ICD-10-CM) - Other intervertebral disc degeneration, lumbar region  Rationale for Evaluation and Treatment: Rehabilitation  THERAPY DIAG:  Acute left-sided low back pain with left-sided sciatica  Muscle weakness (generalized)  Muscle spasm of back  ONSET DATE:  08/11/2022  SUBJECTIVE:                                                                                                                                                                                           SUBJECTIVE STATEMENT: Would like to defer DN to next session since has to go to work after session.   Pain not bad this morning.   Pain  still the worse at the end of the day, but overall doing well.   PERTINENT HISTORY:  Arthritis, chronic LBP, DM (poorly controlled), GERD, HTN, Obestiy, hypothyroidism, bil THR (L revised), abdominal hysterectomy and hernia repair.   PAIN:  Are you having pain? Yes: NPRS scale: 2/10 Pain location: across low back; leg pain at night Pain description:   Aggravating factors: walking, sitting prolonged periods, laying down (has to switch between bed and recliner) Relieving factors: diclofenac  PRECAUTIONS: None  WEIGHT BEARING RESTRICTIONS: No  FALLS:  Has patient fallen in last 6 months? No  LIVING ENVIRONMENT: Lives with: lives alone Lives in: House/apartment Stairs: Yes: External: 3 steps; none Has following equipment at home: None  OCCUPATION: lab tech  PLOF: Independent  PATIENT GOALS: decrease pain   NEXT MD VISIT: October 14, 2022  OBJECTIVE:   DIAGNOSTIC FINDINGS:  MRI scheduled on Tuesday  PATIENT SURVEYS:  Modified Oswestry 27/50 = 54%   SCREENING FOR RED FLAGS: Bowel or bladder incontinence: No Spinal tumors: No Cauda equina syndrome: No Compression fracture: No Abdominal aneurysm: No  COGNITION: Overall cognitive status: Within functional limits for tasks assessed     SENSATION: Numbness and tingling radiating to bottom of L foot  MUSCLE LENGTH: Hamstrings: Right 90 deg; Left -limited to 30 deg today by pain Quadricepst: moderate tightness bil  POSTURE: rounded shoulders and forward head  PALPATION: Tenderness with PA mobs in lumbar spine, L QL, glutes  LUMBAR ROM:   AROM eval 10/21/22  Flexion To knees,  P! Mid leg  Extension Limited 75% 50% limited   Right lateral flexion To knee Distal thigh  Left lateral flexion Limited 75%, p! Distal thigh slightly further down than R side  Right rotation    Left rotation     (Blank rows = not tested)  LOWER EXTREMITY ROM:   deferred hip ROM today due to pain  LOWER EXTREMITY MMT:    MMT Right eval Left eval Left 10/28/22  Hip flexion 5 4 4+  Hip extension     Hip abduction 5 5 5   Hip adduction 5 5 5   Knee flexion 5 4+p! 5  Knee extension 5 4p! 4+  Ankle dorsiflexion 5 4+ 5  Ankle plantarflexion 5 4+ 4+   (Blank rows = not tested)  LUMBAR SPECIAL TESTS:  Straight leg raise test: Positive left  GAIT: Distance walked: 50 Comments: no AD, antalgic  TODAY'S TREATMENT:                                                                                                                              DATE:   10/28/22 Therapeutic Exercise: to improve strength and mobility.  Demo, verbal and tactile cues throughout for technique. Nustep L5 x 7 min  Bird dogs x 10 Child pose stretch Cat cows x 10  Supine hip 90/90s - modified to windshield wipers Supine LTR Supine isometric ball squeeze 10 x 5 sec hold Supine bridge  with ball squeeze x 10  Seated ball flexion stretch x 10     10/21/22 Therapeutic Exercise: to improve strength and mobility.  Demo, verbal and tactile cues throughout for technique. Nustep L5x47min Lumbar AROM Pelvic tilts Supine ab sets 10x5" Supine dead bug (modified) 10x5" Shoulder ext GTB x 20  Seated deadlift x 10   10/19/22 Therapeutic Exercise: to improve strength and mobility.  Demo, verbal and tactile cues throughout for technique. Nustep L5x64min Supine bil quad stretch 3x30 sec with strap Hook-lying hip ADD with TrA engagement 10x5" Hook-lying TrA with march x 8 Hooky-lying TrA with shld ext RTB x 10 Gentle STM to lumbar PS and glutes  10/14/22 Therapeutic Exercise: to improve strength and mobility.  Demo,  verbal and tactile cues throughout for technique. Nustep L5x62min Seated lumbar flexion with green Pball 3 way x 10 each: progressed to 30 second hold fwd 2x Prone hip extension x 10 bil  Quadruped arm raise x 10  Quadruped hip raises x 10 bil Wall push ups x 20  Wall slides for trunk extension  x10  Lateral shift at wall  10/11/2022 Therapeutic Exercise: to improve strength and mobility.  Demo, verbal and tactile cues throughout for technique. Bike L2 x 6 min  Prone leg extension 3 x 10 bil (after Estim to mutlfidi) Seated "rainbow knees" x 10 bil - very difficult on L Standing open doors x 10 bil   Attended Estim: to stimulate multifidi to decrease low back pain using TrDN to directly stimulate muscles.  Trigger Point Dry-Needling  Treatment instructions: Expect mild to moderate muscle soreness. S/S of pneumothorax if dry needled over a lung field, and to seek immediate medical attention should they occur. Patient verbalized understanding of these instructions and education. Patient Consent Given: Yes Education handout provided: Yes Muscles treated: L2 multifidi Electrical stimulation performed: Yes Parameters:  30 milliamps, frequency high enough to elicit twitch x 5 min  Treatment response/outcome: Twitch Response Elicited and Palpable Increase in Muscle Length  PATIENT EDUCATION:  Education details: HEP update  Person educated: Patient Education method: Explanation Education comprehension: verbalized understanding  HOME EXERCISE PROGRAM: Access Code: [email or web address] URL: [email or web address] Date: 10/28/2022 Prepared by: Harrie Foreman  Exercises - Lateral Shift Correction at Wall  - 5 x daily - 7 x weekly - 1 sets - 10 reps - Standing Lumbar Extension with Counter  - 5 x daily - 7 x weekly - 1 sets - 10 reps - Prone Knee Flexion  - 1 x daily - 7 x weekly - 1 sets - 10 reps - Prone Hip Extension  - 1 x daily - 7 x weekly - 3 sets - 10 reps - Prone Press Up  - 1  x daily - 7 x weekly - 1 sets - 10 reps - Prone Alternating Arm and Leg Lifts  - 1 x daily - 7 x weekly - 3 sets - 10 reps - Supine Quadriceps Stretch with Strap on Table  - 1 x daily - 7 x weekly - 3 sets - 3 reps - 30 sec hold - Supine Dead Bug with Leg Extension  - 1 x daily - 7 x weekly - 2 sets - 10 reps - Supine Lower Trunk Rotation  - 1 x daily - 7 x weekly - 3 sets - 10 reps - Supine Hip Internal and External Rotation  - 1 x daily - 7 x weekly - 3 sets - 10 reps - Supine Hip Adduction Isometric with Newman Pies  -  1 x daily - 7 x weekly - 3 sets - 10 reps - Supine Bridge with Mini Swiss Ball Between Knees  - 1 x daily - 7 x weekly - 3 sets - 10 reps - Bird Dog  - 1 x daily - 7 x weekly - 3 sets - 10 reps - Cat Cow  - 1 x daily - 7 x weekly - 3 sets - 10 reps  ASSESSMENT:  CLINICAL IMPRESSION: Patient preferred to defer DN today due to work schedule, instead focused session on core strengthening and progressing HEP.  Overall making good progress, reporting 70% improvement in LBP, but still having a lot of pain in evening and at night. Hana Trippett Bayon continues to demonstrate potential for improvement and would benefit from continued skilled therapy to address impairments.      OBJECTIVE IMPAIRMENTS: Abnormal gait, decreased activity tolerance, decreased endurance, decreased mobility, difficulty walking, decreased ROM, decreased strength, hypomobility, increased fascial restrictions, increased muscle spasms, impaired flexibility, impaired sensation, and pain.   ACTIVITY LIMITATIONS: carrying, lifting, bending, sitting, standing, sleeping, stairs, transfers, bed mobility, and locomotion level  PARTICIPATION LIMITATIONS: occupation  PERSONAL FACTORS: Past/current experiences, Time since onset of injury/illness/exacerbation, and 3+ comorbidities: Arthritis, chronic LBP, DM (poorly controlled), GERD, HTN, Obestiy, hypothyroidism, bil THR (L revised), abdominal hysterectomy and hernia repair.   are  also affecting patient's functional outcome.   REHAB POTENTIAL: Good  CLINICAL DECISION MAKING: Evolving/moderate complexity  EVALUATION COMPLEXITY: Moderate    GOALS: Goals reviewed with patient? Yes  SHORT TERM GOALS: Target date: 10/05/2022   Patient will be independent with initial HEP.  Baseline:  Goal status: MET- 09/29/22  2.  Patient will report centralization of radicular symptoms.  Baseline:  Goal status: IN PROGRESS- 10/14/22 occasional N/T  10/28/22- occasional pain down L leg in evenings only.   LONG TERM GOALS: Target date: 11/02/2022    Patient will be independent with advanced/ongoing HEP to improve outcomes and carryover.  Baseline:  Goal status: IN PROGRESS 10/28/22- met for current  2.  Patient will report 75% improvement in low back pain to improve QOL.  Baseline:  Goal status: IN PROGRESS 10/28/22- 70% improvement overall  3.  Patient will demonstrate full pain free lumbar ROM to perform ADLs.   Baseline: see objective Goal status: IN PROGRESS- 10/21/22  4.  Patient will demonstrate improved functional strength as demonstrated by 5/5 LLE strength. Baseline: see objective Goal status: IN PROGRESS  5.  Patient will report 6 points improvement on modified Oswestry demonstrate improved functional ability.  Baseline: 27/50 Goal status: IN PROGRESS   6.  Patient will be able to sleep without interruption from LBP. Baseline: waking and having to sleep in recliner Goal status: IN PROGRESS 10/28/22- still wakes and sleeps in recliner.   PLAN:  PT FREQUENCY: 1-2x/week  PT DURATION: 6 weeks  PLANNED INTERVENTIONS: Therapeutic exercises, Therapeutic activity, Neuromuscular re-education, Balance training, Gait training, Patient/Family education, Self Care, Joint mobilization, Joint manipulation, Dry Needling, Electrical stimulation, Spinal manipulation, Spinal mobilization, Cryotherapy, Moist heat, Taping, Traction, Ultrasound, Manual therapy, and  Re-evaluation.  PLAN FOR NEXT SESSION: try DN with estim again; Add sciatic nerve glides too.  Progress Mckenzie and core strengthening exercises.  May traction to centralize symptoms.    Jena Gauss, PT 10/28/2022, 8:53 AM

## 2022-11-02 ENCOUNTER — Ambulatory Visit: Payer: 59 | Admitting: Physical Therapy

## 2022-11-02 ENCOUNTER — Encounter: Payer: Self-pay | Admitting: Physical Therapy

## 2022-11-02 DIAGNOSIS — M62830 Muscle spasm of back: Secondary | ICD-10-CM

## 2022-11-02 DIAGNOSIS — M5442 Lumbago with sciatica, left side: Secondary | ICD-10-CM

## 2022-11-02 DIAGNOSIS — M6281 Muscle weakness (generalized): Secondary | ICD-10-CM

## 2022-11-02 NOTE — Therapy (Signed)
OUTPATIENT PHYSICAL THERAPY TREATMENT Progress Note Reporting Period 09/21/2022 to 11/02/2022   See note below for Objective Data and Assessment of Progress/Goals.      Patient Name: Margaret Carey MRN: [id number] DOB:1960/08/27, 62 y.o., female Today's Date: 11/02/2022  END OF SESSION:  PT End of Session - 11/02/22 1643     Visit Number 9    Date for PT Re-Evaluation 12/02/22    Authorization Type [employer] VL: [id number]    PT Start Time 1650    PT Stop Time 1740    PT Time Calculation (min) 50 min                 Past Medical History:  Diagnosis Date   Allergy    Arthritis    Chronic kidney disease    kidney failure 02/2018   Diabetes mellitus    GERD (gastroesophageal reflux disease)    Hypertension    Obese    Sleep apnea    no cpap   Thyroid disease    Hypothyroid   Past Surgical History:  Procedure Laterality Date   ABDOMINAL HYSTERECTOMY  2011   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   CERVICAL CONE BIOPSY  1990   HERNIA REPAIR     HIP SURGERY     Bilateral replacement   IRRIGATION AND DEBRIDEMENT ABSCESS N/A 01/22/2018   Procedure: IRRIGATION AND DEBRIDEMENT BACK ABSCESS;  Surgeon: Glenna Fellows, MD;  Location: [facility];  Service: General;  Laterality: N/A;   OOPHORECTOMY     LSO and RSO   Patient Active Problem List   Diagnosis Date Noted   Abscess of back 01/22/2018   Severe sepsis with septic shock (HCC) 01/22/2018   Sepsis (HCC) 01/21/2018   AKI (acute kidney injury) (HCC) 01/21/2018   Hyponatremia 01/21/2018   DM (diabetes mellitus) (HCC) 01/21/2018   Thyroid disease    Hypertension     PCP: Creola Corn, MD   REFERRING PROVIDER: Lisbeth Renshaw, MD  REFERRING DIAG: M51.36 (ICD-10-CM) - Other intervertebral disc degeneration, lumbar region  Rationale for Evaluation and Treatment: Rehabilitation  THERAPY DIAG:  Acute left-sided low back pain with left-sided sciatica  Muscle weakness (generalized)  Muscle spasm of back  ONSET DATE:  08/11/2022  SUBJECTIVE:                                                                                                                                                                                           SUBJECTIVE STATEMENT: Back pain up since running around a lot today, good report at GP yesterday, she lost 5 lbs, A1C improved, and so has BP.   PERTINENT HISTORY:  Arthritis,  chronic LBP, DM (poorly controlled), GERD, HTN, Obestiy, hypothyroidism, bil THR (L revised), abdominal hysterectomy and hernia repair.   PAIN:  Are you having pain? Yes: NPRS scale: 5-6/10 Pain location: across low back; leg pain at night Pain description:   Aggravating factors: walking, sitting prolonged periods, laying down (has to switch between bed and recliner) Relieving factors: diclofenac  PRECAUTIONS: None  WEIGHT BEARING RESTRICTIONS: No  FALLS:  Has patient fallen in last 6 months? No  LIVING ENVIRONMENT: Lives with: lives alone Lives in: House/apartment Stairs: Yes: External: 3 steps; none Has following equipment at home: None  OCCUPATION: lab tech  PLOF: Independent  PATIENT GOALS: decrease pain   NEXT MD VISIT: October 14, 2022  OBJECTIVE:   DIAGNOSTIC FINDINGS:  MRI scheduled on Tuesday  PATIENT SURVEYS:  Modified Oswestry 27/50 = 54%  11/02/22 27/50   SCREENING FOR RED FLAGS: Bowel or bladder incontinence: No Spinal tumors: No Cauda equina syndrome: No Compression fracture: No Abdominal aneurysm: No  COGNITION: Overall cognitive status: Within functional limits for tasks assessed     SENSATION: Numbness and tingling radiating to bottom of L foot  MUSCLE LENGTH: Hamstrings: Right 90 deg; Left -limited to 30 deg today by pain Quadricepst: moderate tightness bil  POSTURE: rounded shoulders and forward head  PALPATION: Tenderness with PA mobs in lumbar spine, L QL, glutes  LUMBAR ROM:   AROM eval 10/21/22  Flexion To knees, P! Mid leg  Extension Limited 75% 50%  limited   Right lateral flexion To knee Distal thigh  Left lateral flexion Limited 75%, p! Distal thigh slightly further down than R side  Right rotation    Left rotation     (Blank rows = not tested)  LOWER EXTREMITY ROM:   deferred hip ROM today due to pain  LOWER EXTREMITY MMT:    MMT Right eval Left eval Left 10/28/22  Hip flexion 5 4 4+  Hip extension     Hip abduction 5 5 5   Hip adduction 5 5 5   Knee flexion 5 4+p! 5  Knee extension 5 4p! 4+  Ankle dorsiflexion 5 4+ 5  Ankle plantarflexion 5 4+ 4+   (Blank rows = not tested)  LUMBAR SPECIAL TESTS:  Straight leg raise test: Positive left  GAIT: Distance walked: 50 Comments: no AD, antalgic  TODAY'S TREATMENT:                                                                                                                              DATE:  11/02/22 Therapeutic Exercise: to improve strength and mobility.  Demo, verbal and tactile cues throughout for technique. Prone leg extensions 2 x 5 each Prone leg curls Standing hip extension x 10 each  Standing back stretch Seated back stretch with ball In quadruped yoga block lift off x 5 each side (used airex pad, yoga block too high) Standing hip lift on step for ql strengthening x 10 each side  Attended  Estim: to stimulate multifidi to decrease low back pain using TrDN to directly stimulate muscles.  Trigger Point Dry-Needling  Treatment instructions: Expect mild to moderate muscle soreness. S/S of pneumothorax if dry needled over a lung field, and to seek immediate medical attention should they occur. Patient verbalized understanding of these instructions and education. Patient Consent Given: Yes Education handout provided: Yes Muscles treated: L2 multifidi Electrical stimulation performed: Yes Parameters:  30 milliamps, frequency high enough to elicit twitch x 5 min followed by microamp x 5 min  Treatment response/outcome: Twitch Response Elicited and Palpable Increase  in Muscle Length  10/28/22 Therapeutic Exercise: to improve strength and mobility.  Demo, verbal and tactile cues throughout for technique. Nustep L5 x 7 min  Bird dogs x 10 Child pose stretch Cat cows x 10  Supine hip 90/90s - modified to windshield wipers Supine LTR Supine isometric ball squeeze 10 x 5 sec hold Supine bridge with ball squeeze x 10  Seated ball flexion stretch x 10    10/21/22 Therapeutic Exercise: to improve strength and mobility.  Demo, verbal and tactile cues throughout for technique. Nustep L5x37min Lumbar AROM Pelvic tilts Supine ab sets 10x5" Supine dead bug (modified) 10x5" Shoulder ext GTB x 20  Seated deadlift x 10    PATIENT EDUCATION:  Education details: HEP update  Person educated: Patient Education method: Explanation Education comprehension: verbalized understanding  HOME EXERCISE PROGRAM: Access Code: [email or web address] URL: [email or web address] Date: 10/28/2022 Prepared by: Harrie Foreman  Exercises - Lateral Shift Correction at Wall  - 5 x daily - 7 x weekly - 1 sets - 10 reps - Standing Lumbar Extension with Counter  - 5 x daily - 7 x weekly - 1 sets - 10 reps - Prone Knee Flexion  - 1 x daily - 7 x weekly - 1 sets - 10 reps - Prone Hip Extension  - 1 x daily - 7 x weekly - 3 sets - 10 reps - Prone Press Up  - 1 x daily - 7 x weekly - 1 sets - 10 reps - Prone Alternating Arm and Leg Lifts  - 1 x daily - 7 x weekly - 3 sets - 10 reps - Supine Quadriceps Stretch with Strap on Table  - 1 x daily - 7 x weekly - 3 sets - 3 reps - 30 sec hold - Supine Dead Bug with Leg Extension  - 1 x daily - 7 x weekly - 2 sets - 10 reps - Supine Lower Trunk Rotation  - 1 x daily - 7 x weekly - 3 sets - 10 reps - Supine Hip Internal and External Rotation  - 1 x daily - 7 x weekly - 3 sets - 10 reps - Supine Hip Adduction Isometric with Ball  - 1 x daily - 7 x weekly - 3 sets - 10 reps - Supine Bridge with Mini Swiss Ball Between Knees  - 1 x daily  - 7 x weekly - 3 sets - 10 reps - Bird Dog  - 1 x daily - 7 x weekly - 3 sets - 10 reps - Cat Cow  - 1 x daily - 7 x weekly - 3 sets - 10 reps  ASSESSMENT:  CLINICAL IMPRESSION: Brinnley Constantine Quiroa consented to another trial of attended estim via TrDN to lumbar multifidi, tolerated better, reporting no sympathetic symptoms this time, and good tolerance to exercise afterwards.  Overall she reports 70% improvement in LBP since starting PT, but still having increased pain  in evening and at night, and modified Oswestry has not changed (27/50) and still demonstrates weakness in core and L hip musculature with impaired gait.  She would benefit from continued skilled therapy for additional 2x/week for 4 more weeks with focus on continuing attended Estim to lumbar multifidi to improve LBP, as well as addressing hip mobility and core strength to improve gait and scoliosis.       OBJECTIVE IMPAIRMENTS: Abnormal gait, decreased activity tolerance, decreased endurance, decreased mobility, difficulty walking, decreased ROM, decreased strength, hypomobility, increased fascial restrictions, increased muscle spasms, impaired flexibility, impaired sensation, and pain.   ACTIVITY LIMITATIONS: carrying, lifting, bending, sitting, standing, sleeping, stairs, transfers, bed mobility, and locomotion level  PARTICIPATION LIMITATIONS: occupation  PERSONAL FACTORS: Past/current experiences, Time since onset of injury/illness/exacerbation, and 3+ comorbidities: Arthritis, chronic LBP, DM (poorly controlled), GERD, HTN, Obestiy, hypothyroidism, bil THR (L revised), abdominal hysterectomy and hernia repair.   are also affecting patient's functional outcome.   REHAB POTENTIAL: Good  CLINICAL DECISION MAKING: Evolving/moderate complexity  EVALUATION COMPLEXITY: Moderate    GOALS: Goals reviewed with patient? Yes  SHORT TERM GOALS: Target date: 10/05/2022   Patient will be independent with initial HEP.  Baseline:  Goal  status: MET- 09/29/22  2.  Patient will report centralization of radicular symptoms.  Baseline:  Goal status: IN PROGRESS- 10/14/22 occasional N/T  10/28/22- occasional pain down L leg in evenings only.   LONG TERM GOALS: Target date: 11/02/2022  extended to 12/02/22  Patient will be independent with advanced/ongoing HEP to improve outcomes and carryover.  Baseline:  Goal status: IN PROGRESS 10/28/22- met for current  2.  Patient will report 75% improvement in low back pain to improve QOL.  Baseline:  Goal status: IN PROGRESS 10/28/22- 70% improvement overall  3.  Patient will demonstrate full pain free lumbar ROM to perform ADLs.   Baseline: see objective Goal status: IN PROGRESS- 10/21/22- see objective  4.  Patient will demonstrate improved functional strength as demonstrated by 5/5 LLE strength. Baseline: see objective Goal status: IN PROGRESS 10/28/22- see objective   5.  Patient will report 6 points improvement on modified Oswestry demonstrate improved functional ability.  Baseline: 27/50 Goal status: IN PROGRESS  11/02/22- 27/50   6.  Patient will be able to sleep without interruption from LBP. Baseline: waking and having to sleep in recliner Goal status: IN PROGRESS 10/28/22- still wakes and sleeps in recliner.   PLAN:  PT FREQUENCY: 1-2x/week  PT DURATION: 6 weeks  PLANNED INTERVENTIONS: Therapeutic exercises, Therapeutic activity, Neuromuscular re-education, Balance training, Gait training, Patient/Family education, Self Care, Joint mobilization, Joint manipulation, Dry Needling, Electrical stimulation, Spinal manipulation, Spinal mobilization, Cryotherapy, Moist heat, Taping, Traction, Ultrasound, Manual therapy, and Re-evaluation.  PLAN FOR NEXT SESSION: see how she felt after  DN with estim again; try adding more hip mobility, QL strengthening exercises for scoliosis.   Jena Gauss, PT, DPT  11/02/2022, 5:57 PM

## 2022-11-05 ENCOUNTER — Encounter: Payer: Self-pay | Admitting: Physical Therapy

## 2022-11-05 ENCOUNTER — Ambulatory Visit: Payer: 59 | Attending: Neurosurgery | Admitting: Physical Therapy

## 2022-11-05 DIAGNOSIS — R293 Abnormal posture: Secondary | ICD-10-CM | POA: Diagnosis present

## 2022-11-05 DIAGNOSIS — M62830 Muscle spasm of back: Secondary | ICD-10-CM | POA: Insufficient documentation

## 2022-11-05 DIAGNOSIS — M5459 Other low back pain: Secondary | ICD-10-CM | POA: Diagnosis present

## 2022-11-05 DIAGNOSIS — M5442 Lumbago with sciatica, left side: Secondary | ICD-10-CM | POA: Diagnosis present

## 2022-11-05 DIAGNOSIS — M6281 Muscle weakness (generalized): Secondary | ICD-10-CM | POA: Diagnosis present

## 2022-11-05 NOTE — Therapy (Signed)
OUTPATIENT PHYSICAL THERAPY TREATMENT   Patient Name: Margaret Carey MRN: [id number] DOB:Apr 01, 1961, 62 y.o., female Today's Date: 11/05/2022  END OF SESSION:  PT End of Session - 11/05/22 0803     Visit Number 10    Number of Visits 850    Date for PT Re-Evaluation 12/02/22    Authorization Type [employer] VL: [id number]    PT Start Time 0802    PT Stop Time 0846    PT Time Calculation (min) 44 min                 Past Medical History:  Diagnosis Date   Allergy    Arthritis    Chronic kidney disease    kidney failure 02/2018   Diabetes mellitus    GERD (gastroesophageal reflux disease)    Hypertension    Obese    Sleep apnea    no cpap   Thyroid disease    Hypothyroid   Past Surgical History:  Procedure Laterality Date   ABDOMINAL HYSTERECTOMY  2011   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   CERVICAL CONE BIOPSY  1990   HERNIA REPAIR     HIP SURGERY     Bilateral replacement   IRRIGATION AND DEBRIDEMENT ABSCESS N/A 01/22/2018   Procedure: IRRIGATION AND DEBRIDEMENT BACK ABSCESS;  Surgeon: Glenna Fellows, MD;  Location: [facility];  Service: General;  Laterality: N/A;   OOPHORECTOMY     LSO and RSO   Patient Active Problem List   Diagnosis Date Noted   Abscess of back 01/22/2018   Severe sepsis with septic shock (HCC) 01/22/2018   Sepsis (HCC) 01/21/2018   AKI (acute kidney injury) (HCC) 01/21/2018   Hyponatremia 01/21/2018   DM (diabetes mellitus) (HCC) 01/21/2018   Thyroid disease    Hypertension     PCP: Creola Corn, MD   REFERRING PROVIDER: Lisbeth Renshaw, MD  REFERRING DIAG: M51.36 (ICD-10-CM) - Other intervertebral disc degeneration, lumbar region  Rationale for Evaluation and Treatment: Rehabilitation  THERAPY DIAG:  Acute left-sided low back pain with left-sided sciatica  Muscle weakness (generalized)  Muscle spasm of back  ONSET DATE: 08/11/2022  SUBJECTIVE:                                                                                                                                                                                            SUBJECTIVE STATEMENT: Woke up with more pain yesterday, thinks slept wrong because shoulders have been hurting too, tried hot shower this morning, didn't work.   Has been doing as many exercises as possible.   PERTINENT HISTORY:  Arthritis, chronic LBP, DM (poorly controlled), GERD,  HTN, Obestiy, hypothyroidism, bil THR (L revised), abdominal hysterectomy and hernia repair.   PAIN:  Are you having pain? Yes: NPRS scale: 6-7/10 Pain location: across low back; leg pain at night Pain description:   Aggravating factors: walking, sitting prolonged periods, laying down (has to switch between bed and recliner) Relieving factors: diclofenac  PRECAUTIONS: None  WEIGHT BEARING RESTRICTIONS: No  FALLS:  Has patient fallen in last 6 months? No  LIVING ENVIRONMENT: Lives with: lives alone Lives in: House/apartment Stairs: Yes: External: 3 steps; none Has following equipment at home: None  OCCUPATION: lab tech  PLOF: Independent  PATIENT GOALS: decrease pain   NEXT MD VISIT: October 14, 2022  OBJECTIVE:   DIAGNOSTIC FINDINGS:  MRI scheduled on Tuesday  PATIENT SURVEYS:  Modified Oswestry 27/50 = 54%  11/02/22 27/50   SCREENING FOR RED FLAGS: Bowel or bladder incontinence: No Spinal tumors: No Cauda equina syndrome: No Compression fracture: No Abdominal aneurysm: No  COGNITION: Overall cognitive status: Within functional limits for tasks assessed     SENSATION: Numbness and tingling radiating to bottom of L foot  MUSCLE LENGTH: Hamstrings: Right 90 deg; Left -limited to 30 deg today by pain Quadricepst: moderate tightness bil  POSTURE: rounded shoulders and forward head  PALPATION: Tenderness with PA mobs in lumbar spine, L QL, glutes  LUMBAR ROM:   AROM eval 10/21/22  Flexion To knees, P! Mid leg  Extension Limited 75% 50% limited   Right lateral flexion To knee Distal  thigh  Left lateral flexion Limited 75%, p! Distal thigh slightly further down than R side  Right rotation    Left rotation     (Blank rows = not tested)  LOWER EXTREMITY ROM:   deferred hip ROM today due to pain  LOWER EXTREMITY MMT:    MMT Right eval Left eval Left 10/28/22  Hip flexion 5 4 4+  Hip extension     Hip abduction 5 5 5   Hip adduction 5 5 5   Knee flexion 5 4+p! 5  Knee extension 5 4p! 4+  Ankle dorsiflexion 5 4+ 5  Ankle plantarflexion 5 4+ 4+   (Blank rows = not tested)  LUMBAR SPECIAL TESTS:  Straight leg raise test: Positive left  GAIT: Distance walked: 50 Comments: no AD, antalgic  TODAY'S TREATMENT:                                                                                                                              DATE:   11/05/22 Therapeutic Exercise: to improve strength and mobility.  Demo, verbal and tactile cues throughout for technique. Nustep L5 x 6 min  In s/l:  -clams x 10  -reverse clams x 10   -hip retraction with knee on foam roller - tactile cues needed, challenging Prone- isometric hip extension bil 10 x 5 sec holds Seated QL stretch Manual Therapy: to decrease muscle spasm and pain and improve mobility STM/TPR to lumbar paraspinals, glut med  and QL, IASTM to glutes.     11/02/22 Therapeutic Exercise: to improve strength and mobility.  Demo, verbal and tactile cues throughout for technique. Prone leg extensions 2 x 5 each Prone leg curls Standing hip extension x 10 each  Standing back stretch Seated back stretch with ball In quadruped yoga block lift off x 5 each side (used airex pad, yoga block too high) Standing hip lift on step for ql strengthening x 10 each side  Attended Estim: to stimulate multifidi to decrease low back pain using TrDN to directly stimulate muscles.  Trigger Point Dry-Needling  Treatment instructions: Expect mild to moderate muscle soreness. S/S of pneumothorax if dry needled over a lung field, and to  seek immediate medical attention should they occur. Patient verbalized understanding of these instructions and education. Patient Consent Given: Yes Education handout provided: Yes Muscles treated: L2 multifidi Electrical stimulation performed: Yes Parameters:  30 milliamps, frequency high enough to elicit twitch x 5 min followed by microamp x 5 min  Treatment response/outcome: Twitch Response Elicited and Palpable Increase in Muscle Length  10/28/22 Therapeutic Exercise: to improve strength and mobility.  Demo, verbal and tactile cues throughout for technique. Nustep L5 x 7 min  Bird dogs x 10 Child pose stretch Cat cows x 10  Supine hip 90/90s - modified to windshield wipers Supine LTR Supine isometric ball squeeze 10 x 5 sec hold Supine bridge with ball squeeze x 10  Seated ball flexion stretch x 10    PATIENT EDUCATION:  Education details: HEP update  Person educated: Patient Education method: Explanation Education comprehension: verbalized understanding  HOME EXERCISE PROGRAM: Access Code: [email or web address] URL: [email or web address] Date: 11/05/2022 Prepared by: Harrie Foreman  Exercises - Lateral Shift Correction at Wall  - 5 x daily - 7 x weekly - 1 sets - 10 reps - Standing Lumbar Extension with Counter  - 5 x daily - 7 x weekly - 1 sets - 10 reps - Prone Knee Flexion  - 1 x daily - 7 x weekly - 1 sets - 10 reps - Prone Hip Extension  - 1 x daily - 7 x weekly - 3 sets - 10 reps - Prone Press Up  - 1 x daily - 7 x weekly - 1 sets - 10 reps - Prone Alternating Arm and Leg Lifts  - 1 x daily - 7 x weekly - 3 sets - 10 reps - Supine Quadriceps Stretch with Strap on Table  - 1 x daily - 7 x weekly - 3 sets - 3 reps - 30 sec hold - Supine Dead Bug with Leg Extension  - 1 x daily - 7 x weekly - 2 sets - 10 reps - Supine Lower Trunk Rotation  - 1 x daily - 7 x weekly - 3 sets - 10 reps - Supine Hip Internal and External Rotation  - 1 x daily - 7 x weekly - 3 sets -  10 reps - Supine Hip Adduction Isometric with Ball  - 1 x daily - 7 x weekly - 3 sets - 10 reps - Supine Bridge with Mini Swiss Ball Between Knees  - 1 x daily - 7 x weekly - 3 sets - 10 reps - Bird Dog  - 1 x daily - 7 x weekly - 3 sets - 10 reps - Cat Cow  - 1 x daily - 7 x weekly - 3 sets - 10 reps - Clamshell  - 1 x daily - 7 x weekly - 2  sets - 10 reps - Beginner Reverse Clamshell  - 1 x daily - 7 x weekly - 2 sets - 10 reps - Seated Quadratus Lumborum Stretch in Chair  - 1 x daily - 7 x weekly - 1 sets - 3 reps - 15-30 sec  hold  ASSESSMENT:  CLINICAL IMPRESSION: Aeon Koors Dunaway reported more low back pain x 2 days.  Noted trigger point in R QL during manual therapy, may be due to new exercises trying to activate these muscles, reassured, and recommended decreasing exercises for now and focusing on just new hip exercises (clams, reverse clams) for intrinsic hip strengthening along with side stretches.  Reported decreased pain following interventions, will apply CP at work.  Raylynne Cubbage Rozak continues to demonstrate potential for improvement and would benefit from continued skilled therapy to address impairments.         OBJECTIVE IMPAIRMENTS: Abnormal gait, decreased activity tolerance, decreased endurance, decreased mobility, difficulty walking, decreased ROM, decreased strength, hypomobility, increased fascial restrictions, increased muscle spasms, impaired flexibility, impaired sensation, and pain.   ACTIVITY LIMITATIONS: carrying, lifting, bending, sitting, standing, sleeping, stairs, transfers, bed mobility, and locomotion level  PARTICIPATION LIMITATIONS: occupation  PERSONAL FACTORS: Past/current experiences, Time since onset of injury/illness/exacerbation, and 3+ comorbidities: Arthritis, chronic LBP, DM (poorly controlled), GERD, HTN, Obestiy, hypothyroidism, bil THR (L revised), abdominal hysterectomy and hernia repair.   are also affecting patient's functional outcome.   REHAB  POTENTIAL: Good  CLINICAL DECISION MAKING: Evolving/moderate complexity  EVALUATION COMPLEXITY: Moderate    GOALS: Goals reviewed with patient? Yes  SHORT TERM GOALS: Target date: 10/05/2022   Patient will be independent with initial HEP.  Baseline:  Goal status: MET- 09/29/22  2.  Patient will report centralization of radicular symptoms.  Baseline:  Goal status: IN PROGRESS- 10/14/22 occasional N/T  10/28/22- occasional pain down L leg in evenings only.   LONG TERM GOALS: Target date: 11/02/2022  extended to 12/02/22  Patient will be independent with advanced/ongoing HEP to improve outcomes and carryover.  Baseline:  Goal status: IN PROGRESS 10/28/22- met for current  2.  Patient will report 75% improvement in low back pain to improve QOL.  Baseline:  Goal status: IN PROGRESS 10/28/22- 70% improvement overall  3.  Patient will demonstrate full pain free lumbar ROM to perform ADLs.   Baseline: see objective Goal status: IN PROGRESS- 10/21/22- see objective  4.  Patient will demonstrate improved functional strength as demonstrated by 5/5 LLE strength. Baseline: see objective Goal status: IN PROGRESS 10/28/22- see objective   5.  Patient will report 6 points improvement on modified Oswestry demonstrate improved functional ability.  Baseline: 27/50 Goal status: IN PROGRESS  11/02/22- 27/50   6.  Patient will be able to sleep without interruption from LBP. Baseline: waking and having to sleep in recliner Goal status: IN PROGRESS 10/28/22- still wakes and sleeps in recliner.   PLAN:  PT FREQUENCY: 1-2x/week  PT DURATION: 6 weeks  PLANNED INTERVENTIONS: Therapeutic exercises, Therapeutic activity, Neuromuscular re-education, Balance training, Gait training, Patient/Family education, Self Care, Joint mobilization, Joint manipulation, Dry Needling, Electrical stimulation, Spinal manipulation, Spinal mobilization, Cryotherapy, Moist heat, Taping, Traction, Ultrasound, Manual therapy,  and Re-evaluation.  PLAN FOR NEXT SESSION: see how she felt after  DN with estim again; try adding more hip mobility, QL strengthening exercises for scoliosis.   Jena Gauss, PT, DPT  11/05/2022, 12:41 PM

## 2022-11-10 ENCOUNTER — Ambulatory Visit: Payer: 59 | Admitting: Physical Therapy

## 2022-11-10 DIAGNOSIS — M5459 Other low back pain: Secondary | ICD-10-CM

## 2022-11-10 DIAGNOSIS — M6281 Muscle weakness (generalized): Secondary | ICD-10-CM

## 2022-11-10 DIAGNOSIS — M5442 Lumbago with sciatica, left side: Secondary | ICD-10-CM | POA: Diagnosis not present

## 2022-11-10 DIAGNOSIS — R293 Abnormal posture: Secondary | ICD-10-CM

## 2022-11-10 DIAGNOSIS — M62830 Muscle spasm of back: Secondary | ICD-10-CM

## 2022-11-10 NOTE — Therapy (Signed)
OUTPATIENT PHYSICAL THERAPY TREATMENT   Patient Name: TOOBA Carey MRN: [id number] DOB:[birth year], 62 y.o., female Today's Date: 11/10/2022  END OF SESSION:  PT End of Session - 11/10/22 0811     Visit Number 11    Number of Visits --    Date for PT Re-Evaluation 12/02/22    Authorization Type [employer] VL: [id number]    PT Start Time 0802    PT Stop Time 0835   pt needed to go to work   PT Time Calculation (min) 33 min    Activity Tolerance Patient tolerated treatment well    Behavior During Therapy [facility] for tasks assessed/performed                  Past Medical History:  Diagnosis Date   Allergy    Arthritis    Chronic kidney disease    kidney failure 02/2018   Diabetes mellitus    GERD (gastroesophageal reflux disease)    Hypertension    Obese    Sleep apnea    no cpap   Thyroid disease    Hypothyroid   Past Surgical History:  Procedure Laterality Date   ABDOMINAL HYSTERECTOMY  2011   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   CERVICAL CONE BIOPSY  1990   HERNIA REPAIR     HIP SURGERY     Bilateral replacement   IRRIGATION AND DEBRIDEMENT ABSCESS N/A 01/22/2018   Procedure: IRRIGATION AND DEBRIDEMENT BACK ABSCESS;  Surgeon: Glenna Fellows, MD;  Location: [facility];  Service: General;  Laterality: N/A;   OOPHORECTOMY     LSO and RSO   Patient Active Problem List   Diagnosis Date Noted   Abscess of back 01/22/2018   Severe sepsis with septic shock (HCC) 01/22/2018   Sepsis (HCC) 01/21/2018   AKI (acute kidney injury) (HCC) 01/21/2018   Hyponatremia 01/21/2018   DM (diabetes mellitus) (HCC) 01/21/2018   Thyroid disease    Hypertension     PCP: Creola Corn, MD   REFERRING PROVIDER: Lisbeth Renshaw, MD  REFERRING DIAG: M51.36 (ICD-10-CM) - Other intervertebral disc degeneration, lumbar region  Rationale for Evaluation and Treatment: Rehabilitation  THERAPY DIAG:  Acute left-sided low back pain with left-sided sciatica  Muscle weakness (generalized)  Muscle  spasm of back  Other low back pain  Abnormal posture  ONSET DATE: 08/11/2022  SUBJECTIVE:                                                                                                                                                                                           SUBJECTIVE STATEMENT: Pt still reports stiffness and muscle tension in her  neck.   PERTINENT HISTORY:  Arthritis, chronic LBP, DM (poorly controlled), GERD, HTN, Obestiy, hypothyroidism, bil THR (L revised), abdominal hysterectomy and hernia repair.   PAIN:  Are you having pain? Yes: NPRS scale: 3/10 Pain location: center of the back Pain description: ache Aggravating factors: walking, sitting prolonged periods, laying down (has to switch between bed and recliner) Relieving factors: diclofenac  PRECAUTIONS: None  WEIGHT BEARING RESTRICTIONS: No  FALLS:  Has patient fallen in last 6 months? No  LIVING ENVIRONMENT: Lives with: lives alone Lives in: House/apartment Stairs: Yes: External: 3 steps; none Has following equipment at home: None  OCCUPATION: lab tech  PLOF: Independent  PATIENT GOALS: decrease pain   NEXT MD VISIT: October 14, 2022  OBJECTIVE:   DIAGNOSTIC FINDINGS:  MRI scheduled on Tuesday  PATIENT SURVEYS:  Modified Oswestry 27/50 = 54%  11/02/22 27/50   SCREENING FOR RED FLAGS: Bowel or bladder incontinence: No Spinal tumors: No Cauda equina syndrome: No Compression fracture: No Abdominal aneurysm: No  COGNITION: Overall cognitive status: Within functional limits for tasks assessed     SENSATION: Numbness and tingling radiating to bottom of L foot  MUSCLE LENGTH: Hamstrings: Right 90 deg; Left -limited to 30 deg today by pain Quadricepst: moderate tightness bil  POSTURE: rounded shoulders and forward head  PALPATION: Tenderness with PA mobs in lumbar spine, L QL, glutes  LUMBAR ROM:   AROM eval 10/21/22  Flexion To knees, P! Mid leg  Extension Limited 75% 50%  limited   Right lateral flexion To knee Distal thigh  Left lateral flexion Limited 75%, p! Distal thigh slightly further down than R side  Right rotation    Left rotation     (Blank rows = not tested)  LOWER EXTREMITY ROM:   deferred hip ROM today due to pain  LOWER EXTREMITY MMT:    MMT Right eval Left eval Left 10/28/22  Hip flexion 5 4 4+  Hip extension     Hip abduction 5 5 5   Hip adduction 5 5 5   Knee flexion 5 4+p! 5  Knee extension 5 4p! 4+  Ankle dorsiflexion 5 4+ 5  Ankle plantarflexion 5 4+ 4+   (Blank rows = not tested)  LUMBAR SPECIAL TESTS:  Straight leg raise test: Positive left  GAIT: Distance walked: 50 Comments: no AD, antalgic  TODAY'S TREATMENT:                                                                                                                              DATE:  11/10/22 Therapeutic Exercise: to improve strength and mobility.  Demo, verbal and tactile cues throughout for technique. Nustep L5 x 5 min  Supine UT and levator stretch 3x10" S/L clamshell x 12 each side S/L reverse clamshell x 12 each side Modified single leg bridge x 10 bil Seated hip hinge with neutral spine x 10   Manual Therapy: to decrease muscle spasm and pain and improve mobility  STM to L UT,LS, upper rhomboids   11/05/22 Therapeutic Exercise: to improve strength and mobility.  Demo, verbal and tactile cues throughout for technique. Nustep L5 x 6 min  In s/l:  -clams x 10  -reverse clams x 10   -hip retraction with knee on foam roller - tactile cues needed, challenging Prone- isometric hip extension bil 10 x 5 sec holds Seated QL stretch Manual Therapy: to decrease muscle spasm and pain and improve mobility STM/TPR to lumbar paraspinals, glut med and QL, IASTM to glutes.     11/02/22 Therapeutic Exercise: to improve strength and mobility.  Demo, verbal and tactile cues throughout for technique. Prone leg extensions 2 x 5 each Prone leg curls Standing hip  extension x 10 each  Standing back stretch Seated back stretch with ball In quadruped yoga block lift off x 5 each side (used airex pad, yoga block too high) Standing hip lift on step for ql strengthening x 10 each side  Attended Estim: to stimulate multifidi to decrease low back pain using TrDN to directly stimulate muscles.  Trigger Point Dry-Needling  Treatment instructions: Expect mild to moderate muscle soreness. S/S of pneumothorax if dry needled over a lung field, and to seek immediate medical attention should they occur. Patient verbalized understanding of these instructions and education. Patient Consent Given: Yes Education handout provided: Yes Muscles treated: L2 multifidi Electrical stimulation performed: Yes Parameters:  30 milliamps, frequency high enough to elicit twitch x 5 min followed by microamp x 5 min  Treatment response/outcome: Twitch Response Elicited and Palpable Increase in Muscle Length  10/28/22 Therapeutic Exercise: to improve strength and mobility.  Demo, verbal and tactile cues throughout for technique. Nustep L5 x 7 min  Bird dogs x 10 Child pose stretch Cat cows x 10  Supine hip 90/90s - modified to windshield wipers Supine LTR Supine isometric ball squeeze 10 x 5 sec hold Supine bridge with ball squeeze x 10  Seated ball flexion stretch x 10    PATIENT EDUCATION:  Education details: HEP update  Person educated: Patient Education method: Explanation Education comprehension: verbalized understanding  HOME EXERCISE PROGRAM: Access Code: [email or web address] URL: [email or web address] Date: 11/05/2022 Prepared by: Harrie Foreman  Exercises - Lateral Shift Correction at Wall  - 5 x daily - 7 x weekly - 1 sets - 10 reps - Standing Lumbar Extension with Counter  - 5 x daily - 7 x weekly - 1 sets - 10 reps - Prone Knee Flexion  - 1 x daily - 7 x weekly - 1 sets - 10 reps - Prone Hip Extension  - 1 x daily - 7 x weekly - 3 sets - 10 reps -  Prone Press Up  - 1 x daily - 7 x weekly - 1 sets - 10 reps - Prone Alternating Arm and Leg Lifts  - 1 x daily - 7 x weekly - 3 sets - 10 reps - Supine Quadriceps Stretch with Strap on Table  - 1 x daily - 7 x weekly - 3 sets - 3 reps - 30 sec hold - Supine Dead Bug with Leg Extension  - 1 x daily - 7 x weekly - 2 sets - 10 reps - Supine Lower Trunk Rotation  - 1 x daily - 7 x weekly - 3 sets - 10 reps - Supine Hip Internal and External Rotation  - 1 x daily - 7 x weekly - 3 sets - 10 reps - Supine Hip Adduction Isometric with Newman Pies  -  1 x daily - 7 x weekly - 3 sets - 10 reps - Supine Bridge with Mini Swiss Ball Between Knees  - 1 x daily - 7 x weekly - 3 sets - 10 reps - Bird Dog  - 1 x daily - 7 x weekly - 3 sets - 10 reps - Cat Cow  - 1 x daily - 7 x weekly - 3 sets - 10 reps - Clamshell  - 1 x daily - 7 x weekly - 2 sets - 10 reps - Beginner Reverse Clamshell  - 1 x daily - 7 x weekly - 2 sets - 10 reps - Seated Quadratus Lumborum Stretch in Chair  - 1 x daily - 7 x weekly - 1 sets - 3 reps - 15-30 sec  hold  ASSESSMENT:  CLINICAL IMPRESSION:  Margaret Carey arrived with reports of muscle tension on L side of neck. She also wished to end session early as she needed to get to work right after. She had some tautness in the L upper trap with TTP. Progressed with hip strengthening and mobility exercises while providing cues. Marles Swinton Tupy continues to demonstrate potential for improvement and would benefit from continued skilled therapy to address impairments.         OBJECTIVE IMPAIRMENTS: Abnormal gait, decreased activity tolerance, decreased endurance, decreased mobility, difficulty walking, decreased ROM, decreased strength, hypomobility, increased fascial restrictions, increased muscle spasms, impaired flexibility, impaired sensation, and pain.   ACTIVITY LIMITATIONS: carrying, lifting, bending, sitting, standing, sleeping, stairs, transfers, bed mobility, and locomotion level  PARTICIPATION  LIMITATIONS: occupation  PERSONAL FACTORS: Past/current experiences, Time since onset of injury/illness/exacerbation, and 3+ comorbidities: Arthritis, chronic LBP, DM (poorly controlled), GERD, HTN, Obestiy, hypothyroidism, bil THR (L revised), abdominal hysterectomy and hernia repair.   are also affecting patient's functional outcome.   REHAB POTENTIAL: Good  CLINICAL DECISION MAKING: Evolving/moderate complexity  EVALUATION COMPLEXITY: Moderate    GOALS: Goals reviewed with patient? Yes  SHORT TERM GOALS: Target date: 10/05/2022   Patient will be independent with initial HEP.  Baseline:  Goal status: MET- 09/29/22  2.  Patient will report centralization of radicular symptoms.  Baseline:  Goal status: IN PROGRESS- 10/14/22 occasional N/T  10/28/22- occasional pain down L leg in evenings only.   LONG TERM GOALS: Target date: 11/02/2022  extended to 12/02/22  Patient will be independent with advanced/ongoing HEP to improve outcomes and carryover.  Baseline:  Goal status: IN PROGRESS 10/28/22- met for current  2.  Patient will report 75% improvement in low back pain to improve QOL.  Baseline:  Goal status: IN PROGRESS 10/28/22- 70% improvement overall  3.  Patient will demonstrate full pain free lumbar ROM to perform ADLs.   Baseline: see objective Goal status: IN PROGRESS- 10/21/22- see objective  4.  Patient will demonstrate improved functional strength as demonstrated by 5/5 LLE strength. Baseline: see objective Goal status: IN PROGRESS 10/28/22- see objective   5.  Patient will report 6 points improvement on modified Oswestry demonstrate improved functional ability.  Baseline: 27/50 Goal status: IN PROGRESS  11/02/22- 27/50   6.  Patient will be able to sleep without interruption from LBP. Baseline: waking and having to sleep in recliner Goal status: IN PROGRESS 10/28/22- still wakes and sleeps in recliner.   PLAN:  PT FREQUENCY: 1-2x/week  PT DURATION: 6 weeks  PLANNED  INTERVENTIONS: Therapeutic exercises, Therapeutic activity, Neuromuscular re-education, Balance training, Gait training, Patient/Family education, Self Care, Joint mobilization, Joint manipulation, Dry Needling, Electrical stimulation, Spinal manipulation,  Spinal mobilization, Cryotherapy, Moist heat, Taping, Traction, Ultrasound, Manual therapy, and Re-evaluation.  PLAN FOR NEXT SESSION: see how she felt after  DN with estim again; try adding more hip mobility, QL strengthening exercises for scoliosis.   Darleene Cleaver, PTA 11/10/2022, 8:45 AM

## 2022-11-16 ENCOUNTER — Encounter: Payer: Self-pay | Admitting: Physical Therapy

## 2022-11-16 ENCOUNTER — Ambulatory Visit: Payer: 59

## 2022-11-16 DIAGNOSIS — M5442 Lumbago with sciatica, left side: Secondary | ICD-10-CM | POA: Diagnosis not present

## 2022-11-16 DIAGNOSIS — M6281 Muscle weakness (generalized): Secondary | ICD-10-CM

## 2022-11-16 DIAGNOSIS — M62830 Muscle spasm of back: Secondary | ICD-10-CM

## 2022-11-16 NOTE — Therapy (Signed)
OUTPATIENT PHYSICAL THERAPY TREATMENT   Patient Name: Margaret Carey MRN: [id number] DOB:[birth year], 62 y.o., female Today's Date: 11/16/2022  END OF SESSION:  PT End of Session - 11/16/22 1707     Visit Number 12    Date for PT Re-Evaluation 12/02/22    Authorization Type [employer] VL: [id number]    PT Start Time 1704    PT Stop Time 1742    PT Time Calculation (min) 38 min    Activity Tolerance Patient tolerated treatment well    Behavior During Therapy WFL for tasks assessed/performed                  Past Medical History:  Diagnosis Date   Allergy    Arthritis    Chronic kidney disease    kidney failure 02/2018   Diabetes mellitus    GERD (gastroesophageal reflux disease)    Hypertension    Obese    Sleep apnea    no cpap   Thyroid disease    Hypothyroid   Past Surgical History:  Procedure Laterality Date   ABDOMINAL HYSTERECTOMY  2011   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   CERVICAL CONE BIOPSY  1990   HERNIA REPAIR     HIP SURGERY     Bilateral replacement   IRRIGATION AND DEBRIDEMENT ABSCESS N/A 01/22/2018   Procedure: IRRIGATION AND DEBRIDEMENT BACK ABSCESS;  Surgeon: Glenna Fellows, MD;  Location: [facility];  Service: General;  Laterality: N/A;   OOPHORECTOMY     LSO and RSO   Patient Active Problem List   Diagnosis Date Noted   Abscess of back 01/22/2018   Severe sepsis with septic shock (HCC) 01/22/2018   Sepsis (HCC) 01/21/2018   AKI (acute kidney injury) (HCC) 01/21/2018   Hyponatremia 01/21/2018   DM (diabetes mellitus) (HCC) 01/21/2018   Thyroid disease    Hypertension     PCP: Creola Corn, MD   REFERRING PROVIDER: Lisbeth Renshaw, MD  REFERRING DIAG: M51.36 (ICD-10-CM) - Other intervertebral disc degeneration, lumbar region  Rationale for Evaluation and Treatment: Rehabilitation  THERAPY DIAG:  Acute left-sided low back pain with left-sided sciatica  Muscle weakness (generalized)  Muscle spasm of back  ONSET DATE:  08/11/2022  SUBJECTIVE:                                                                                                                                                                                           SUBJECTIVE STATEMENT: Margaret Carey reports good response to TrDN, she didn't feel as crooked after and she also didn't have as hard a time picking her leg up and putting in car,  so willing to try again.  Back is hurting now since end of day.    PERTINENT HISTORY:  Arthritis, chronic LBP, DM (poorly controlled), GERD, HTN, Obestiy, hypothyroidism, bil THR (L revised), abdominal hysterectomy and hernia repair.   PAIN:  Are you having pain? Yes: NPRS scale: 7-8/10 Pain location: center of the back Pain description: ache Aggravating factors: walking, sitting prolonged periods, laying down (has to switch between bed and recliner) Relieving factors: diclofenac  PRECAUTIONS: None  WEIGHT BEARING RESTRICTIONS: No  FALLS:  Has patient fallen in last 6 months? No  LIVING ENVIRONMENT: Lives with: lives alone Lives in: House/apartment Stairs: Yes: External: 3 steps; none Has following equipment at home: None  OCCUPATION: lab tech  PLOF: Independent  PATIENT GOALS: decrease pain   NEXT MD VISIT: October 14, 2022  OBJECTIVE:   DIAGNOSTIC FINDINGS:  MRI scheduled on Tuesday  PATIENT SURVEYS:  Modified Oswestry 27/50 = 54%  11/02/22 27/50   SCREENING FOR RED FLAGS: Bowel or bladder incontinence: No Spinal tumors: No Cauda equina syndrome: No Compression fracture: No Abdominal aneurysm: No  COGNITION: Overall cognitive status: Within functional limits for tasks assessed     SENSATION: Numbness and tingling radiating to bottom of L foot  MUSCLE LENGTH: Hamstrings: Right 90 deg; Left -limited to 30 deg today by pain Quadricepst: moderate tightness bil  POSTURE: rounded shoulders and forward head  PALPATION: Tenderness with PA mobs in lumbar spine, L QL,  glutes  LUMBAR ROM:   AROM eval 10/21/22  Flexion To knees, P! Mid leg  Extension Limited 75% 50% limited   Right lateral flexion To knee Distal thigh  Left lateral flexion Limited 75%, p! Distal thigh slightly further down than R side  Right rotation    Left rotation     (Blank rows = not tested)  LOWER EXTREMITY ROM:   deferred hip ROM today due to pain  LOWER EXTREMITY MMT:    MMT Right eval Left eval Left 10/28/22  Hip flexion 5 4 4+  Hip extension     Hip abduction 5 5 5   Hip adduction 5 5 5   Knee flexion 5 4+p! 5  Knee extension 5 4p! 4+  Ankle dorsiflexion 5 4+ 5  Ankle plantarflexion 5 4+ 4+   (Blank rows = not tested)  LUMBAR SPECIAL TESTS:  Straight leg raise test: Positive left  GAIT: Distance walked: 50 Comments: no AD, antalgic  TODAY'S TREATMENT:                                                                                                                              DATE:  11/16/22 Therapeutic Exercise: to improve strength and mobility.  Demo, verbal and tactile cues throughout for technique. Nustep L5 x 5 min  Discussion of HEP Manual Therapy: to decrease muscle spasm and pain and improve mobility STM/TPR to lumbar paraspinals and glutes, QL stretch bil, skilled palpation and monitoring during dry needling. Trigger Point Dry-Needling  Treatment instructions: Expect mild to moderate muscle soreness. S/S of pneumothorax if dry needled over a lung field, and to seek immediate medical attention should they occur. Patient verbalized understanding of these instructions and education. Patient Consent Given: Yes Education handout provided: Previously provided Muscles treated: bil lumbar multifidi L2-5 Electrical stimulation performed: No Parameters: N/A Treatment response/outcome: Twitch Response Elicited and Palpable Increase in Muscle Length   11/10/22 Therapeutic Exercise: to improve strength and mobility.  Demo, verbal and tactile cues throughout for  technique. Nustep L5 x 5 min  Supine UT and levator stretch 3x10" S/L clamshell x 12 each side S/L reverse clamshell x 12 each side Modified single leg bridge x 10 bil Seated hip hinge with neutral spine x 10   Manual Therapy: to decrease muscle spasm and pain and improve mobility STM to L UT,LS, upper rhomboids   11/05/22 Therapeutic Exercise: to improve strength and mobility.  Demo, verbal and tactile cues throughout for technique. Nustep L5 x 6 min  In s/l:  -clams x 10  -reverse clams x 10   -hip retraction with knee on foam roller - tactile cues needed, challenging Prone- isometric hip extension bil 10 x 5 sec holds Seated QL stretch Manual Therapy: to decrease muscle spasm and pain and improve mobility STM/TPR to lumbar paraspinals, glut med and QL, IASTM to glutes.     PATIENT EDUCATION:  Education details: HEP update  Person educated: Patient Education method: Explanation Education comprehension: verbalized understanding  HOME EXERCISE PROGRAM: Access Code: [email or web address] URL: [email or web address] Date: 11/05/2022 Prepared by: Harrie Foreman  Exercises - Lateral Shift Correction at Wall  - 5 x daily - 7 x weekly - 1 sets - 10 reps - Standing Lumbar Extension with Counter  - 5 x daily - 7 x weekly - 1 sets - 10 reps - Prone Knee Flexion  - 1 x daily - 7 x weekly - 1 sets - 10 reps - Prone Hip Extension  - 1 x daily - 7 x weekly - 3 sets - 10 reps - Prone Press Up  - 1 x daily - 7 x weekly - 1 sets - 10 reps - Prone Alternating Arm and Leg Lifts  - 1 x daily - 7 x weekly - 3 sets - 10 reps - Supine Quadriceps Stretch with Strap on Table  - 1 x daily - 7 x weekly - 3 sets - 3 reps - 30 sec hold - Supine Dead Bug with Leg Extension  - 1 x daily - 7 x weekly - 2 sets - 10 reps - Supine Lower Trunk Rotation  - 1 x daily - 7 x weekly - 3 sets - 10 reps - Supine Hip Internal and External Rotation  - 1 x daily - 7 x weekly - 3 sets - 10 reps - Supine Hip  Adduction Isometric with Ball  - 1 x daily - 7 x weekly - 3 sets - 10 reps - Supine Bridge with Mini Swiss Ball Between Knees  - 1 x daily - 7 x weekly - 3 sets - 10 reps - Bird Dog  - 1 x daily - 7 x weekly - 3 sets - 10 reps - Cat Cow  - 1 x daily - 7 x weekly - 3 sets - 10 reps - Clamshell  - 1 x daily - 7 x weekly - 2 sets - 10 reps - Beginner Reverse Clamshell  - 1 x daily - 7 x weekly - 2 sets - 10  reps - Seated Quadratus Lumborum Stretch in Chair  - 1 x daily - 7 x weekly - 1 sets - 3 reps - 15-30 sec  hold  ASSESSMENT:  CLINICAL IMPRESSION:  Marim consented to another trial of TrDN, this time without Estim to compare effects, tolerated well, along wit manual therapy.  She reports good compliance with HEP, although still having a lot of stiffness in whole body, which is frustrating.   Next session will focus on more whole body stretches.   Margaret Carey continues to demonstrate potential for improvement and would benefit from continued skilled therapy to address impairments.         OBJECTIVE IMPAIRMENTS: Abnormal gait, decreased activity tolerance, decreased endurance, decreased mobility, difficulty walking, decreased ROM, decreased strength, hypomobility, increased fascial restrictions, increased muscle spasms, impaired flexibility, impaired sensation, and pain.   ACTIVITY LIMITATIONS: carrying, lifting, bending, sitting, standing, sleeping, stairs, transfers, bed mobility, and locomotion level  PARTICIPATION LIMITATIONS: occupation  PERSONAL FACTORS: Past/current experiences, Time since onset of injury/illness/exacerbation, and 3+ comorbidities: Arthritis, chronic LBP, DM (poorly controlled), GERD, HTN, Obestiy, hypothyroidism, bil THR (L revised), abdominal hysterectomy and hernia repair.   are also affecting patient's functional outcome.   REHAB POTENTIAL: Good  CLINICAL DECISION MAKING: Evolving/moderate complexity  EVALUATION COMPLEXITY: Moderate    GOALS: Goals  reviewed with patient? Yes  SHORT TERM GOALS: Target date: 10/05/2022   Patient will be independent with initial HEP.  Baseline:  Goal status: MET- 09/29/22  2.  Patient will report centralization of radicular symptoms.  Baseline:  Goal status: IN PROGRESS- 10/14/22 occasional N/T  10/28/22- occasional pain down L leg in evenings only.   LONG TERM GOALS: Target date: 11/02/2022  extended to 12/02/22  Patient will be independent with advanced/ongoing HEP to improve outcomes and carryover.  Baseline:  Goal status: IN PROGRESS 10/28/22- met for current  2.  Patient will report 75% improvement in low back pain to improve QOL.  Baseline:  Goal status: IN PROGRESS 10/28/22- 70% improvement overall  3.  Patient will demonstrate full pain free lumbar ROM to perform ADLs.   Baseline: see objective Goal status: IN PROGRESS- 10/21/22- see objective  4.  Patient will demonstrate improved functional strength as demonstrated by 5/5 LLE strength. Baseline: see objective Goal status: IN PROGRESS 10/28/22- see objective   5.  Patient will report 6 points improvement on modified Oswestry demonstrate improved functional ability.  Baseline: 27/50 Goal status: IN PROGRESS  11/02/22- 27/50   6.  Patient will be able to sleep without interruption from LBP. Baseline: waking and having to sleep in recliner Goal status: IN PROGRESS 10/28/22- still wakes and sleeps in recliner.   PLAN:  PT FREQUENCY: 1-2x/week  PT DURATION: 6 weeks  PLANNED INTERVENTIONS: Therapeutic exercises, Therapeutic activity, Neuromuscular re-education, Balance training, Gait training, Patient/Family education, Self Care, Joint mobilization, Joint manipulation, Dry Needling, Electrical stimulation, Spinal manipulation, Spinal mobilization, Cryotherapy, Moist heat, Taping, Traction, Ultrasound, Manual therapy, and Re-evaluation.  PLAN FOR NEXT SESSION:continue focusing on mobility and stretches.  QL strengthening exercises for scoliosis.    Jena Gauss, PT, DPT 11/16/2022, 5:54 PM

## 2022-11-18 ENCOUNTER — Ambulatory Visit: Payer: 59 | Admitting: Physical Therapy

## 2022-11-22 ENCOUNTER — Ambulatory Visit: Payer: 59

## 2022-11-22 DIAGNOSIS — M5442 Lumbago with sciatica, left side: Secondary | ICD-10-CM

## 2022-11-22 DIAGNOSIS — M5459 Other low back pain: Secondary | ICD-10-CM

## 2022-11-22 DIAGNOSIS — R293 Abnormal posture: Secondary | ICD-10-CM

## 2022-11-22 DIAGNOSIS — M62830 Muscle spasm of back: Secondary | ICD-10-CM

## 2022-11-22 DIAGNOSIS — M6281 Muscle weakness (generalized): Secondary | ICD-10-CM

## 2022-11-22 NOTE — Therapy (Signed)
OUTPATIENT PHYSICAL THERAPY TREATMENT   Patient Name: Margaret Carey MRN: [id number] DOB:10-22-1960, 62 y.o., female Today's Date: 11/22/2022  END OF SESSION:  PT End of Session - 11/22/22 0809     Visit Number 13    Date for PT Re-Evaluation 12/02/22    Authorization Type [employer] VL: [id number]    PT Start Time 0805    PT Stop Time 0845    PT Time Calculation (min) 40 min    Activity Tolerance Patient tolerated treatment well    Behavior During Therapy [facility] for tasks assessed/performed                   Past Medical History:  Diagnosis Date   Allergy    Arthritis    Chronic kidney disease    kidney failure 02/2018   Diabetes mellitus    GERD (gastroesophageal reflux disease)    Hypertension    Obese    Sleep apnea    no cpap   Thyroid disease    Hypothyroid   Past Surgical History:  Procedure Laterality Date   ABDOMINAL HYSTERECTOMY  2011   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   CERVICAL CONE BIOPSY  1990   HERNIA REPAIR     HIP SURGERY     Bilateral replacement   IRRIGATION AND DEBRIDEMENT ABSCESS N/A 01/22/2018   Procedure: IRRIGATION AND DEBRIDEMENT BACK ABSCESS;  Surgeon: Glenna Fellows, MD;  Location: [facility];  Service: General;  Laterality: N/A;   OOPHORECTOMY     LSO and RSO   Patient Active Problem List   Diagnosis Date Noted   Abscess of back 01/22/2018   Severe sepsis with septic shock (HCC) 01/22/2018   Sepsis (HCC) 01/21/2018   AKI (acute kidney injury) (HCC) 01/21/2018   Hyponatremia 01/21/2018   DM (diabetes mellitus) (HCC) 01/21/2018   Thyroid disease    Hypertension     PCP: Creola Corn, MD   REFERRING PROVIDER: Lisbeth Renshaw, MD  REFERRING DIAG: M51.36 (ICD-10-CM) - Other intervertebral disc degeneration, lumbar region  Rationale for Evaluation and Treatment: Rehabilitation  THERAPY DIAG:  Acute left-sided low back pain with left-sided sciatica  Muscle weakness (generalized)  Muscle spasm of back  Other low back  pain  Abnormal posture  ONSET DATE: 08/11/2022  SUBJECTIVE:                                                                                                                                                                                           SUBJECTIVE STATEMENT: Pt feeling pretty good today, mild pain, feels like she needs to work on her flexibility more.  PERTINENT HISTORY:  Arthritis,  chronic LBP, DM (poorly controlled), GERD, HTN, Obestiy, hypothyroidism, bil THR (L revised), abdominal hysterectomy and hernia repair.   PAIN:  Are you having pain? Yes: NPRS scale: 1-2/10 Pain location: center of the back Pain description: ache Aggravating factors: walking, sitting prolonged periods, laying down (has to switch between bed and recliner) Relieving factors: diclofenac  PRECAUTIONS: None  WEIGHT BEARING RESTRICTIONS: No  FALLS:  Has patient fallen in last 6 months? No  LIVING ENVIRONMENT: Lives with: lives alone Lives in: House/apartment Stairs: Yes: External: 3 steps; none Has following equipment at home: None  OCCUPATION: lab tech  PLOF: Independent  PATIENT GOALS: decrease pain   NEXT MD VISIT: October 14, 2022  OBJECTIVE:   DIAGNOSTIC FINDINGS:  MRI scheduled on Tuesday  PATIENT SURVEYS:  Modified Oswestry 27/50 = 54%  11/02/22 27/50   SCREENING FOR RED FLAGS: Bowel or bladder incontinence: No Spinal tumors: No Cauda equina syndrome: No Compression fracture: No Abdominal aneurysm: No  COGNITION: Overall cognitive status: Within functional limits for tasks assessed     SENSATION: Numbness and tingling radiating to bottom of L foot  MUSCLE LENGTH: Hamstrings: Right 90 deg; Left -limited to 30 deg today by pain Quadricepst: moderate tightness bil  POSTURE: rounded shoulders and forward head  PALPATION: Tenderness with PA mobs in lumbar spine, L QL, glutes  LUMBAR ROM:   AROM eval 10/21/22  Flexion To knees, P! Mid leg  Extension Limited 75% 50%  limited   Right lateral flexion To knee Distal thigh  Left lateral flexion Limited 75%, p! Distal thigh slightly further down than R side  Right rotation    Left rotation     (Blank rows = not tested)  LOWER EXTREMITY ROM:   deferred hip ROM today due to pain  LOWER EXTREMITY MMT:    MMT Right eval Left eval Left 10/28/22  Hip flexion 5 4 4+  Hip extension     Hip abduction 5 5 5   Hip adduction 5 5 5   Knee flexion 5 4+p! 5  Knee extension 5 4p! 4+  Ankle dorsiflexion 5 4+ 5  Ankle plantarflexion 5 4+ 4+   (Blank rows = not tested)  LUMBAR SPECIAL TESTS:  Straight leg raise test: Positive left  GAIT: Distance walked: 50 Comments: no AD, antalgic  TODAY'S TREATMENT:                                                                                                                              DATE:  11/22/22 Therapeutic Exercise: to improve strength and mobility.  Demo, verbal and tactile cues throughout for technique. Nustep L5 x 6 min  Lumbar extension in standing 10x3" Seated lumbar flexion stretch 10x5" Seated side bends 5x5" each direction Seated trunk twist x 10 each direction Supine trunk rotation on pball x 10  SKTC 2x15 sec each side Supine hip ER 10x3" bil Supine HS curls with swiss ball x 10 11/16/22 Therapeutic Exercise: to improve  strength and mobility.  Demo, verbal and tactile cues throughout for technique. Nustep L5 x 5 min  Discussion of HEP Manual Therapy: to decrease muscle spasm and pain and improve mobility STM/TPR to lumbar paraspinals and glutes, QL stretch bil, skilled palpation and monitoring during dry needling. Trigger Point Dry-Needling  Treatment instructions: Expect mild to moderate muscle soreness. S/S of pneumothorax if dry needled over a lung field, and to seek immediate medical attention should they occur. Patient verbalized understanding of these instructions and education. Patient Consent Given: Yes Education handout provided:  Previously provided Muscles treated: bil lumbar multifidi L2-5 Electrical stimulation performed: No Parameters: N/A Treatment response/outcome: Twitch Response Elicited and Palpable Increase in Muscle Length   11/10/22 Therapeutic Exercise: to improve strength and mobility.  Demo, verbal and tactile cues throughout for technique. Nustep L5 x 5 min  Supine UT and levator stretch 3x10" S/L clamshell x 12 each side S/L reverse clamshell x 12 each side Modified single leg bridge x 10 bil Seated hip hinge with neutral spine x 10   Manual Therapy: to decrease muscle spasm and pain and improve mobility STM to L UT,LS, upper rhomboids   11/05/22 Therapeutic Exercise: to improve strength and mobility.  Demo, verbal and tactile cues throughout for technique. Nustep L5 x 6 min  In s/l:  -clams x 10  -reverse clams x 10   -hip retraction with knee on foam roller - tactile cues needed, challenging Prone- isometric hip extension bil 10 x 5 sec holds Seated QL stretch Manual Therapy: to decrease muscle spasm and pain and improve mobility STM/TPR to lumbar paraspinals, glut med and QL, IASTM to glutes.     PATIENT EDUCATION:  Education details: HEP update  Person educated: Patient Education method: Explanation Education comprehension: verbalized understanding  HOME EXERCISE PROGRAM: Access Code: [email or web address] URL: [email or web address] Date: 11/05/2022 Prepared by: Harrie Foreman  Exercises - Lateral Shift Correction at Wall  - 5 x daily - 7 x weekly - 1 sets - 10 reps - Standing Lumbar Extension with Counter  - 5 x daily - 7 x weekly - 1 sets - 10 reps - Prone Knee Flexion  - 1 x daily - 7 x weekly - 1 sets - 10 reps - Prone Hip Extension  - 1 x daily - 7 x weekly - 3 sets - 10 reps - Prone Press Up  - 1 x daily - 7 x weekly - 1 sets - 10 reps - Prone Alternating Arm and Leg Lifts  - 1 x daily - 7 x weekly - 3 sets - 10 reps - Supine Quadriceps Stretch with Strap on Table   - 1 x daily - 7 x weekly - 3 sets - 3 reps - 30 sec hold - Supine Dead Bug with Leg Extension  - 1 x daily - 7 x weekly - 2 sets - 10 reps - Supine Lower Trunk Rotation  - 1 x daily - 7 x weekly - 3 sets - 10 reps - Supine Hip Internal and External Rotation  - 1 x daily - 7 x weekly - 3 sets - 10 reps - Supine Hip Adduction Isometric with Ball  - 1 x daily - 7 x weekly - 3 sets - 10 reps - Supine Bridge with Mini Swiss Ball Between Knees  - 1 x daily - 7 x weekly - 3 sets - 10 reps - Bird Dog  - 1 x daily - 7 x weekly - 3 sets - 10 reps -  Cat Cow  - 1 x daily - 7 x weekly - 3 sets - 10 reps - Clamshell  - 1 x daily - 7 x weekly - 2 sets - 10 reps - Beginner Reverse Clamshell  - 1 x daily - 7 x weekly - 2 sets - 10 reps - Seated Quadratus Lumborum Stretch in Chair  - 1 x daily - 7 x weekly - 1 sets - 3 reps - 15-30 sec  hold  ASSESSMENT:  CLINICAL IMPRESSION:    Margaret Carey continues to demonstrate potential for improvement and would benefit from continued skilled therapy to address impairments.         OBJECTIVE IMPAIRMENTS: Abnormal gait, decreased activity tolerance, decreased endurance, decreased mobility, difficulty walking, decreased ROM, decreased strength, hypomobility, increased fascial restrictions, increased muscle spasms, impaired flexibility, impaired sensation, and pain.   ACTIVITY LIMITATIONS: carrying, lifting, bending, sitting, standing, sleeping, stairs, transfers, bed mobility, and locomotion level  PARTICIPATION LIMITATIONS: occupation  PERSONAL FACTORS: Past/current experiences, Time since onset of injury/illness/exacerbation, and 3+ comorbidities: Arthritis, chronic LBP, DM (poorly controlled), GERD, HTN, Obestiy, hypothyroidism, bil THR (L revised), abdominal hysterectomy and hernia repair.   are also affecting patient's functional outcome.   REHAB POTENTIAL: Good  CLINICAL DECISION MAKING: Evolving/moderate complexity  EVALUATION COMPLEXITY:  Moderate    GOALS: Goals reviewed with patient? Yes  SHORT TERM GOALS: Target date: 10/05/2022   Patient will be independent with initial HEP.  Baseline:  Goal status: MET- 09/29/22  2.  Patient will report centralization of radicular symptoms.  Baseline:  Goal status: IN PROGRESS- 10/14/22 occasional N/T  10/28/22- occasional pain down L leg in evenings only.   LONG TERM GOALS: Target date: 11/02/2022  extended to 12/02/22  Patient will be independent with advanced/ongoing HEP to improve outcomes and carryover.  Baseline:  Goal status: IN PROGRESS 10/28/22- met for current  2.  Patient will report 75% improvement in low back pain to improve QOL.  Baseline:  Goal status: IN PROGRESS 10/28/22- 70% improvement overall  3.  Patient will demonstrate full pain free lumbar ROM to perform ADLs.   Baseline: see objective Goal status: IN PROGRESS- 10/21/22- see objective  4.  Patient will demonstrate improved functional strength as demonstrated by 5/5 LLE strength. Baseline: see objective Goal status: IN PROGRESS 10/28/22- see objective   5.  Patient will report 6 points improvement on modified Oswestry demonstrate improved functional ability.  Baseline: 27/50 Goal status: IN PROGRESS  11/02/22- 27/50   6.  Patient will be able to sleep without interruption from LBP. Baseline: waking and having to sleep in recliner Goal status: IN PROGRESS 10/28/22- still wakes and sleeps in recliner.   PLAN:  PT FREQUENCY: 1-2x/week  PT DURATION: 6 weeks  PLANNED INTERVENTIONS: Therapeutic exercises, Therapeutic activity, Neuromuscular re-education, Balance training, Gait training, Patient/Family education, Self Care, Joint mobilization, Joint manipulation, Dry Needling, Electrical stimulation, Spinal manipulation, Spinal mobilization, Cryotherapy, Moist heat, Taping, Traction, Ultrasound, Manual therapy, and Re-evaluation.  PLAN FOR NEXT SESSION:continue focusing on mobility and stretches.  QL  strengthening exercises for scoliosis.   Darleene Cleaver, PTA 11/22/2022, 8:47 AM

## 2022-11-25 ENCOUNTER — Ambulatory Visit: Payer: 59 | Admitting: Physical Therapy

## 2022-11-25 ENCOUNTER — Encounter: Payer: Self-pay | Admitting: Physical Therapy

## 2022-11-25 DIAGNOSIS — M6281 Muscle weakness (generalized): Secondary | ICD-10-CM

## 2022-11-25 DIAGNOSIS — M62830 Muscle spasm of back: Secondary | ICD-10-CM

## 2022-11-25 DIAGNOSIS — M5442 Lumbago with sciatica, left side: Secondary | ICD-10-CM | POA: Diagnosis not present

## 2022-11-25 NOTE — Therapy (Addendum)
OUTPATIENT PHYSICAL THERAPY TREATMENT Progress Note Reporting Period 11/02/2022 to 11/25/2022  See note below for Objective Data and Assessment of Progress/Goals.      Patient Name: Margaret Carey MRN: [id number] DOB:02-23-1961, 62 y.o., female Today's Date: 11/25/2022  END OF SESSION:  PT End of Session - 11/25/22 0805     Visit Number 14    Date for PT Re-Evaluation 02/17/23    Authorization Type [employer] VL: [id number]    PT Start Time 0802    PT Stop Time 0847    PT Time Calculation (min) 45 min    Activity Tolerance Patient tolerated treatment well    Behavior During Therapy [facility] for tasks assessed/performed                   Past Medical History:  Diagnosis Date   Allergy    Arthritis    Chronic kidney disease    kidney failure 02/2018   Diabetes mellitus    GERD (gastroesophageal reflux disease)    Hypertension    Obese    Sleep apnea    no cpap   Thyroid disease    Hypothyroid   Past Surgical History:  Procedure Laterality Date   ABDOMINAL HYSTERECTOMY  2011   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   CERVICAL CONE BIOPSY  1990   HERNIA REPAIR     HIP SURGERY     Bilateral replacement   IRRIGATION AND DEBRIDEMENT ABSCESS N/A 01/22/2018   Procedure: IRRIGATION AND DEBRIDEMENT BACK ABSCESS;  Surgeon: Glenna Fellows, MD;  Location: [facility];  Service: General;  Laterality: N/A;   OOPHORECTOMY     LSO and RSO   Patient Active Problem List   Diagnosis Date Noted   Abscess of back 01/22/2018   Severe sepsis with septic shock (HCC) 01/22/2018   Sepsis (HCC) 01/21/2018   AKI (acute kidney injury) (HCC) 01/21/2018   Hyponatremia 01/21/2018   DM (diabetes mellitus) (HCC) 01/21/2018   Thyroid disease    Hypertension     PCP: Creola Corn, MD   REFERRING PROVIDER: Lisbeth Renshaw, MD  REFERRING DIAG: M51.36 (ICD-10-CM) - Other intervertebral disc degeneration, lumbar region  Rationale for Evaluation and Treatment: Rehabilitation  THERAPY DIAG:  Acute  left-sided low back pain with left-sided sciatica  Muscle weakness (generalized)  Muscle spasm of back  ONSET DATE: 08/11/2022  SUBJECTIVE:                                                                                                                                                                                           SUBJECTIVE STATEMENT: Has to cancel last PT visits due to work.  Feels like her pain is better and stronger compared to last episode of PT.  Would like to continue, but can't manage 2x/week.   PERTINENT HISTORY:  Arthritis, chronic LBP, DM (poorly controlled), GERD, HTN, Obestiy, hypothyroidism, bil THR (L revised), abdominal hysterectomy and hernia repair.   PAIN:  Are you having pain? Yes: NPRS scale: 1-2/10 Pain location: center of the back Pain description: ache Aggravating factors: walking, sitting prolonged periods, laying down (has to switch between bed and recliner) Relieving factors: diclofenac  PRECAUTIONS: None  WEIGHT BEARING RESTRICTIONS: No  FALLS:  Has patient fallen in last 6 months? No  LIVING ENVIRONMENT: Lives with: lives alone Lives in: House/apartment Stairs: Yes: External: 3 steps; none Has following equipment at home: None  OCCUPATION: lab tech  PLOF: Independent  PATIENT GOALS: decrease pain   NEXT MD VISIT: October 14, 2022  OBJECTIVE:   DIAGNOSTIC FINDINGS:  MRI scheduled on Tuesday  PATIENT SURVEYS:  Modified Oswestry 27/50 = 54%  11/02/22 27/50   SCREENING FOR RED FLAGS: Bowel or bladder incontinence: No Spinal tumors: No Cauda equina syndrome: No Compression fracture: No Abdominal aneurysm: No  COGNITION: Overall cognitive status: Within functional limits for tasks assessed     SENSATION: Numbness and tingling radiating to bottom of L foot  MUSCLE LENGTH: Hamstrings: Right 90 deg; Left -limited to 30 deg today by pain Quadricepst: moderate tightness bil  POSTURE: rounded shoulders and forward  head  PALPATION: Tenderness with PA mobs in lumbar spine, L QL, glutes  LUMBAR ROM:   AROM eval 10/21/22 11/25/22  Flexion To knees, P! Mid leg Mid leg  Extension Limited 75% 50% limited  25% limited   Right lateral flexion To knee Distal thigh To knee  Left lateral flexion Limited 75%, p! Distal thigh slightly further down than R side Almost to knee but no pain  Right rotation   WNL  Left rotation   50% limited   (Blank rows = not tested)  LOWER EXTREMITY ROM:   deferred hip ROM today due to pain  LOWER EXTREMITY MMT:    MMT Right eval Left eval Left 10/28/22  Hip flexion 5 4 4+  Hip extension     Hip abduction 5 5 5   Hip adduction 5 5 5   Knee flexion 5 4+p! 5  Knee extension 5 4p! 4+  Ankle dorsiflexion 5 4+ 5  Ankle plantarflexion 5 4+ 4+   (Blank rows = not tested)  LUMBAR SPECIAL TESTS:  Straight leg raise test: Positive left  GAIT: Distance walked: 50 Comments: no AD, antalgic  TODAY'S TREATMENT:                                                                                                                              DATE:  11/25/22 Therapeutic Exercise: to improve strength and mobility.  Demo, verbal and tactile cues throughout for technique. Nustep L5 x 6 min  Lumbar extension in standing 10x3" Seated lumbar flexion  stretch 10x5" Seated trunk twist x 10 each direction Lumbar AROM  Manual Therapy: to decrease muscle spasm and pain and improve mobility STM/TPR to bil cervical paraspinals, UT, L/S, skilled palpation and monitoring during dry needling. Trigger Point Dry-Needling  Treatment instructions: Expect mild to moderate muscle soreness. S/S of pneumothorax if dry needled over a lung field, and to seek immediate medical attention should they occur. Patient verbalized understanding of these instructions and education. Patient Consent Given: Yes Education handout provided: Previously provided Muscles treated: bil cervical multifid C4-5, bil  UT Electrical stimulation performed: No Parameters: N/A Treatment response/outcome: Twitch Response Elicited and Palpable Increase in Muscle Length    11/22/22 Therapeutic Exercise: to improve strength and mobility.  Demo, verbal and tactile cues throughout for technique. Nustep L5 x 6 min  Lumbar extension in standing 10x3" Seated lumbar flexion stretch 10x5" Seated side bends 5x5" each direction Seated trunk twist x 10 each direction Supine trunk rotation on pball x 10  SKTC 2x15 sec each side Supine hip ER 10x3" bil Supine HS curls with swiss ball x 10 11/16/22 Therapeutic Exercise: to improve strength and mobility.  Demo, verbal and tactile cues throughout for technique. Nustep L5 x 5 min  Discussion of HEP Manual Therapy: to decrease muscle spasm and pain and improve mobility STM/TPR to lumbar paraspinals and glutes, QL stretch bil, skilled palpation and monitoring during dry needling. Trigger Point Dry-Needling  Treatment instructions: Expect mild to moderate muscle soreness. S/S of pneumothorax if dry needled over a lung field, and to seek immediate medical attention should they occur. Patient verbalized understanding of these instructions and education. Patient Consent Given: Yes Education handout provided: Previously provided Muscles treated: bil lumbar multifidi L2-5 Electrical stimulation performed: No Parameters: N/A Treatment response/outcome: Twitch Response Elicited and Palpable Increase in Muscle Length   11/10/22 Therapeutic Exercise: to improve strength and mobility.  Demo, verbal and tactile cues throughout for technique. Nustep L5 x 5 min  Supine UT and levator stretch 3x10" S/L clamshell x 12 each side S/L reverse clamshell x 12 each side Modified single leg bridge x 10 bil Seated hip hinge with neutral spine x 10   Manual Therapy: to decrease muscle spasm and pain and improve mobility STM to L UT,LS, upper rhomboids   11/05/22 Therapeutic Exercise: to  improve strength and mobility.  Demo, verbal and tactile cues throughout for technique. Nustep L5 x 6 min  In s/l:  -clams x 10  -reverse clams x 10   -hip retraction with knee on foam roller - tactile cues needed, challenging Prone- isometric hip extension bil 10 x 5 sec holds Seated QL stretch Manual Therapy: to decrease muscle spasm and pain and improve mobility STM/TPR to lumbar paraspinals, glut med and QL, IASTM to glutes.     PATIENT EDUCATION:  Education details: HEP review  Person educated: Patient Education method: Explanation Education comprehension: verbalized understanding  HOME EXERCISE PROGRAM: Access Code: [email or web address] URL: [email or web address] Date: 11/05/2022 Prepared by: Harrie Foreman  Exercises - Lateral Shift Correction at Wall  - 5 x daily - 7 x weekly - 1 sets - 10 reps - Standing Lumbar Extension with Counter  - 5 x daily - 7 x weekly - 1 sets - 10 reps - Prone Knee Flexion  - 1 x daily - 7 x weekly - 1 sets - 10 reps - Prone Hip Extension  - 1 x daily - 7 x weekly - 3 sets - 10 reps - Prone Press Up  - 1  x daily - 7 x weekly - 1 sets - 10 reps - Prone Alternating Arm and Leg Lifts  - 1 x daily - 7 x weekly - 3 sets - 10 reps - Supine Quadriceps Stretch with Strap on Table  - 1 x daily - 7 x weekly - 3 sets - 3 reps - 30 sec hold - Supine Dead Bug with Leg Extension  - 1 x daily - 7 x weekly - 2 sets - 10 reps - Supine Lower Trunk Rotation  - 1 x daily - 7 x weekly - 3 sets - 10 reps - Supine Hip Internal and External Rotation  - 1 x daily - 7 x weekly - 3 sets - 10 reps - Supine Hip Adduction Isometric with Ball  - 1 x daily - 7 x weekly - 3 sets - 10 reps - Supine Bridge with Mini Swiss Ball Between Knees  - 1 x daily - 7 x weekly - 3 sets - 10 reps - Bird Dog  - 1 x daily - 7 x weekly - 3 sets - 10 reps - Cat Cow  - 1 x daily - 7 x weekly - 3 sets - 10 reps - Clamshell  - 1 x daily - 7 x weekly - 2 sets - 10 reps - Beginner Reverse  Clamshell  - 1 x daily - 7 x weekly - 2 sets - 10 reps - Seated Quadratus Lumborum Stretch in Chair  - 1 x daily - 7 x weekly - 1 sets - 3 reps - 15-30 sec  hold  ASSESSMENT:  CLINICAL IMPRESSION: FLOY ANGERT has been making progress in PT, reports less pain overall compared to previous episodes, but still demonstrates LLE weakness, impaired posture and gait, and LBP pain especially at end of day and interfering with sleep.  She would like to continue PT as she has found it very beneficial and feels she is making good progress, but due to work would like to decrease frequency to about every other week, since she is independent with so extending POC for 12 weeks with focus on updating HEP and manual therapy.  Bonita Quin reported she was having a lot of dizziness and neck stiffness after reading for extended periods, so by request we did trial manual therapy and TrDN to neck and UT today.    Slyvia Lartigue Thissen continues to demonstrate potential for improvement and would benefit from continued skilled therapy to address impairments.         OBJECTIVE IMPAIRMENTS: Abnormal gait, decreased activity tolerance, decreased endurance, decreased mobility, difficulty walking, decreased ROM, decreased strength, hypomobility, increased fascial restrictions, increased muscle spasms, impaired flexibility, impaired sensation, and pain.   ACTIVITY LIMITATIONS: carrying, lifting, bending, sitting, standing, sleeping, stairs, transfers, bed mobility, and locomotion level  PARTICIPATION LIMITATIONS: occupation  PERSONAL FACTORS: Past/current experiences, Time since onset of injury/illness/exacerbation, and 3+ comorbidities: Arthritis, chronic LBP, DM (poorly controlled), GERD, HTN, Obestiy, hypothyroidism, bil THR (L revised), abdominal hysterectomy and hernia repair.   are also affecting patient's functional outcome.   REHAB POTENTIAL: Good  CLINICAL DECISION MAKING: Evolving/moderate complexity  EVALUATION COMPLEXITY:  Moderate    GOALS: Goals reviewed with patient? Yes  SHORT TERM GOALS: Target date: 10/05/2022   Patient will be independent with initial HEP.  Baseline:  Goal status: MET- 09/29/22  2.  Patient will report centralization of radicular symptoms.  Baseline:  Goal status: IN PROGRESS- 10/14/22 occasional N/T  10/28/22- occasional pain down L leg in evenings only.  LONG TERM GOALS: Target date: extended to 02/17/23  Patient will be independent with advanced/ongoing HEP to improve outcomes and carryover.  Baseline:  Goal status: IN PROGRESS 10/28/22- met for current  2.  Patient will report 75% improvement in low back pain to improve QOL.  Baseline:  Goal status: IN PROGRESS 10/28/22- 70% improvement overall  3.  Patient will demonstrate full pain free lumbar ROM to perform ADLs.   Baseline: see objective Goal status: IN PROGRESS- 10/21/22- see objective 10/25/22- improving.   4.  Patient will demonstrate improved functional strength as demonstrated by 5/5 LLE strength. Baseline: see objective Goal status: IN PROGRESS 10/28/22- see objective 11/25/22- L hip flexor still weak 4-/5  5.  Patient will report 6 points improvement on modified Oswestry demonstrate improved functional ability.  Baseline: 27/50 Goal status: IN PROGRESS  11/02/22- 27/50   6.  Patient will be able to sleep without interruption from LBP. Baseline: waking and having to sleep in recliner Goal status: IN PROGRESS 10/28/22- still wakes and sleeps in recliner.  11/25/22- about the same, still going back and forth between recliner and bed.   PLAN:  PT FREQUENCY: 1x to every other week  PT DURATION: 12 weeks  PLANNED INTERVENTIONS: Therapeutic exercises, Therapeutic activity, Neuromuscular re-education, Balance training, Gait training, Patient/Family education, Self Care, Joint mobilization, Joint manipulation, Dry Needling, Electrical stimulation, Spinal manipulation, Spinal mobilization, Cryotherapy, Moist heat,  Taping, Traction, Ultrasound, Manual therapy, and Re-evaluation.  PLAN FOR NEXT SESSION: continue focusing on mobility and stretches.  QL strengthening exercises for scoliosis.   Jena Gauss, PT, DPT  11/25/2022, 1:06 PM   PHYSICAL THERAPY DISCHARGE SUMMARY  Visits from Start of Care: 14  Current functional level related to goals / functional outcomes: Decreased low back pain   Remaining deficits: LLE weakness, impaired gait and posture   Education / Equipment: HEP  Plan: Patient agrees to discharge.  Patient goals were not met. Patient is being discharged by request due to financial reasons.     Jena Gauss, PT, DPT 01/12/2023 8:20 AM

## 2022-12-02 ENCOUNTER — Encounter: Payer: 59 | Admitting: Physical Therapy

## 2023-06-07 ENCOUNTER — Encounter (HOSPITAL_BASED_OUTPATIENT_CLINIC_OR_DEPARTMENT_OTHER): Payer: Self-pay | Admitting: Emergency Medicine

## 2023-06-07 ENCOUNTER — Other Ambulatory Visit: Payer: Self-pay

## 2023-06-07 ENCOUNTER — Emergency Department (HOSPITAL_BASED_OUTPATIENT_CLINIC_OR_DEPARTMENT_OTHER)
Admission: EM | Admit: 2023-06-07 | Discharge: 2023-06-07 | Disposition: A | Discharge status: Home / Self Care | Attending: Emergency Medicine | Admitting: Emergency Medicine

## 2023-06-07 DIAGNOSIS — Z79899 Other long term (current) drug therapy: Secondary | ICD-10-CM | POA: Diagnosis not present

## 2023-06-07 DIAGNOSIS — Z7982 Long term (current) use of aspirin: Secondary | ICD-10-CM | POA: Insufficient documentation

## 2023-06-07 DIAGNOSIS — Z7984 Long term (current) use of oral hypoglycemic drugs: Secondary | ICD-10-CM | POA: Diagnosis not present

## 2023-06-07 DIAGNOSIS — M5441 Lumbago with sciatica, right side: Secondary | ICD-10-CM | POA: Diagnosis not present

## 2023-06-07 DIAGNOSIS — Z794 Long term (current) use of insulin: Secondary | ICD-10-CM | POA: Diagnosis not present

## 2023-06-07 DIAGNOSIS — M5431 Sciatica, right side: Secondary | ICD-10-CM

## 2023-06-07 DIAGNOSIS — M545 Low back pain, unspecified: Secondary | ICD-10-CM | POA: Diagnosis present

## 2023-06-07 MED ORDER — OXYCODONE HCL 5 MG PO TABS
5.0000 mg | ORAL_TABLET | Freq: Once | ORAL | Status: AC
  Administered 2023-06-07: 5 mg via ORAL
  Filled 2023-06-07: qty 1

## 2023-06-07 MED ORDER — DIAZEPAM 5 MG PO TABS
5.0000 mg | ORAL_TABLET | Freq: Once | ORAL | Status: AC
  Administered 2023-06-07: 5 mg via ORAL
  Filled 2023-06-07: qty 1

## 2023-06-07 MED ORDER — KETOROLAC TROMETHAMINE 15 MG/ML IJ SOLN
15.0000 mg | Freq: Once | INTRAMUSCULAR | Status: AC
  Administered 2023-06-07: 15 mg via INTRAMUSCULAR
  Filled 2023-06-07: qty 1

## 2023-06-07 MED ORDER — ACETAMINOPHEN 500 MG PO TABS
1000.0000 mg | ORAL_TABLET | Freq: Once | ORAL | Status: AC
  Administered 2023-06-07: 1000 mg via ORAL
  Filled 2023-06-07: qty 2

## 2023-06-07 MED ORDER — METHOCARBAMOL 500 MG PO TABS: 500.0000 mg | ORAL_TABLET | Freq: Two times a day (BID) | ORAL | 0 refills | Status: AC

## 2023-06-07 NOTE — ED Triage Notes (Signed)
 Back pain X 1 week, lower back going right leg. Hx of same, seen and treated with no relief.

## 2023-06-07 NOTE — ED Provider Notes (Signed)
 [place] [facility] Provider Note   CSN: [id number] Arrival date & time: 06/07/23  0351     History  Chief Complaint  Patient presents with   Back Pain    Margaret Carey is a 63 y.o. female.  63 yo F with a chief complaints of right-sided back pain that radiates down the leg.  This been going on for about a week this time.  No known trauma.  No loss of bowel or bladder no loss of perirectal sensation no numbness or weakness to the leg.  She has seen her neurosurgeon at the onset of the symptoms.  He currently has her on a steroid Dosepak, Flexeril, gabapentin.  She is not feeling like it is getting any better and felt he got worse overnight.   Back Pain      Home Medications Prior to Admission medications   Medication Sig Start Date End Date Taking? Authorizing Provider  methocarbamol (ROBAXIN) 500 MG tablet Take 1 tablet (500 mg total) by mouth 2 (two) times daily. 06/07/23  Yes Melene Plan, DO  amoxicillin (AMOXIL) 500 MG tablet Take 500 mg by mouth 2 (two) times daily. Taken before dental work    [provider]  aspirin 81 MG tablet Take 81 mg by mouth daily.    [provider]  blood glucose meter kit and supplies KIT Dispense based on patient and insurance preference. Use up to four times daily as directed. (FOR ICD-9 250.00, 250.01). 01/25/18   Marinda Elk, MD  canagliflozin (INVOKANA) 100 MG TABS tablet Take 100 mg by mouth.    [provider]  HYDROcodone-acetaminophen (NORCO/VICODIN) 5-325 MG tablet Take 1 tablet by mouth every 6 (six) hours as needed for severe pain. 08/13/22   Pollyann [place], MD  ibuprofen (ADVIL,MOTRIN) 600 MG tablet Take 1 tablet (600 mg total) by mouth every 6 (six) hours as needed. 05/28/16   Barrett Henle, PA-C  Insulin Pen Needle 31G X 6 MM MISC 1 Device by Does not apply route 2 (two) times daily. 01/25/18   Marinda Elk, MD  LEVEMIR FLEXTOUCH 100 UNIT/ML  FlexPen Inject into the skin. 02/13/20   [provider]  lisinopril (ZESTRIL) 10 MG tablet Take 10 mg by mouth daily.    [provider]  lisinopril-hydrochlorothiazide (PRINZIDE,ZESTORETIC) 10-12.5 MG per tablet Take 1 tablet by mouth 2 (two) times daily. Patient not taking: Reported on 08/13/2021    [provider]  metFORMIN (GLUCOPHAGE) 1000 MG tablet Take 1,000 mg by mouth 2 (two) times daily with a meal.    [provider]  naproxen (NAPROSYN) 500 MG tablet Take 1 tablet (500 mg total) by mouth 2 (two) times daily. 08/13/22   Pollyann [place], MD  omeprazole (PRILOSEC) 20 MG capsule Take 20 mg by mouth 2 (two) times a week.    [provider]  sitaGLIPtan-metformin (JANUMET) 50-1000 MG per tablet Take 1 tablet by mouth 2 (two) times daily with a meal.    [provider]  thyroid (ARMOUR) 180 MG tablet Take 180 mg by mouth daily. 180mg  po once daily    [provider]  TRULICITY 0.75 MG/0.5ML SOPN Inject 0.75 mg into the skin once a week. 12/28/17   [provider]      Allergies    Sulfa antibiotics    Review of Systems   Review of Systems  Musculoskeletal:  Positive for back pain.    Physical Exam Updated  Vital Signs BP (!) 165/71 (BP Location: Right Arm)   Pulse 90   Temp 98.1 F (36.7 C) (Oral)   Resp 20   Ht 5' 3.5" (1.613 m)   Wt 99.8 kg   SpO2 99%   BMI 38.36 kg/m  Physical Exam Vitals and nursing note reviewed.  Constitutional:      General: She is not in acute distress.    Appearance: She is well-developed. She is not diaphoretic.  HENT:     Head: Normocephalic and atraumatic.  Eyes:     Pupils: Pupils are equal, round, and reactive to light.  Cardiovascular:     Rate and Rhythm: Normal rate and regular rhythm.     Heart sounds: No murmur heard.    No friction rub. No gallop.  Pulmonary:     Effort: Pulmonary effort is normal.     Breath sounds: No wheezing or rales.  Abdominal:      General: There is no distension.     Palpations: Abdomen is soft.     Tenderness: There is no abdominal tenderness.  Musculoskeletal:        General: Tenderness present.     Cervical back: Normal range of motion and neck supple.     Comments: She points out her lower L-spine and to the muscle belly of the piriformis.  I do not appreciate any obvious discomfort on the midline L-spine.  She does have piriformis tenderness.  Pulse motor and sensation are intact distally.  She is able to ambulate with pain.  Skin:    General: Skin is warm and dry.  Neurological:     Mental Status: She is alert and oriented to person, place, and time.  Psychiatric:        Behavior: Behavior normal.     ED Results / Procedures / Treatments   Labs (all labs ordered are listed, but only abnormal results are displayed) Labs Reviewed - No data to display  EKG None  Radiology No results found.  Procedures Procedures    Medications Ordered in ED Medications  acetaminophen (TYLENOL) tablet 1,000 mg (has no administration in time range)  ketorolac (TORADOL) 15 MG/ML injection 15 mg (has no administration in time range)  oxyCODONE (Oxy IR/ROXICODONE) immediate release tablet 5 mg (has no administration in time range)  diazepam (VALIUM) tablet 5 mg (has no administration in time range)    ED Course/ Medical Decision Making/ A&P                                 Medical Decision Making Risk OTC drugs. Prescription drug management.   63 yo F with a chief complaints of right-sided low back pain that radiates down the leg.  This has been an off-and-on issue for her.  She sees Dr. Conchita Paris in the office, saw him about a week ago.  Is currently on a steroid pack but feels like things are getting worse.  She has no obvious red flags other than age.  Has a benign exam.  Is ambulating here freely.  Will attempt to treat her pain here.  Will try to change her Flexeril to Robaxin.  Encouraged her to call her  neurosurgeon this morning.  4:13 AM:  I have discussed the diagnosis/risks/treatment options with the patient and family.  Evaluation and diagnostic testing in the emergency department does not suggest an emergent condition requiring admission or immediate intervention beyond what has been  performed at this time.  They will follow up with Neurosurgery. We also discussed returning to the ED immediately if new or worsening sx occur. We discussed the sx which are most concerning (e.g., sudden worsening pain, fever, inability to tolerate by mouth, cauda equina s/sx) that necessitate immediate return. Medications administered to the patient during their visit and any new prescriptions provided to the patient are listed below.  Medications given during this visit Medications  acetaminophen (TYLENOL) tablet 1,000 mg (has no administration in time range)  ketorolac (TORADOL) 15 MG/ML injection 15 mg (has no administration in time range)  oxyCODONE (Oxy IR/ROXICODONE) immediate release tablet 5 mg (has no administration in time range)  diazepam (VALIUM) tablet 5 mg (has no administration in time range)     The patient appears reasonably screen and/or stabilized for discharge and I doubt any other medical condition or other [facility] requiring further screening, evaluation, or treatment in the ED at this time prior to discharge.          Final Clinical Impression(s) / ED Diagnoses Final diagnoses:  Sciatica of right side    Rx / DC Orders ED Discharge Orders          Ordered    methocarbamol (ROBAXIN) 500 MG tablet  2 times daily        06/07/23 0409              Melene Plan, DO 06/07/23 0413

## 2023-06-07 NOTE — Discharge Instructions (Signed)
 Your back pain is most likely due to a muscular strain.  There is been a lot of research on back pain, unfortunately the only thing that seems to really help is Tylenol and ibuprofen.  Relative rest is also important to not lift greater than 10 pounds bending or twisting at the waist.  Please follow-up with your family physician.  The other thing that really seems to benefit patients is physical therapy which your doctor may send you for.  Please return to the emergency department for new numbness or weakness to your arms or legs. Difficulty with urinating or urinating or pooping on yourself.  Also if you cannot feel toilet paper when you wipe or get a fever.   Continue to take the steroids as prescribed.  I would have you use the Voltaren gel 4x a day. Also take tylenol 1000mg (2 extra strength) four times a day.   Then you can try the muscle relaxant.  I would have you just try 1 or the other.  If you take multiple medicines it can impair your ability to walk and could make it easier for you to fall down or get into an accident.

## 2023-11-30 ENCOUNTER — Other Ambulatory Visit (HOSPITAL_COMMUNITY): Payer: Self-pay

## 2023-12-30 ENCOUNTER — Other Ambulatory Visit: Payer: Self-pay

## 2023-12-30 ENCOUNTER — Other Ambulatory Visit (HOSPITAL_COMMUNITY): Payer: Self-pay

## 2023-12-30 DIAGNOSIS — I10 Essential (primary) hypertension: Secondary | ICD-10-CM | POA: Diagnosis not present

## 2023-12-30 DIAGNOSIS — K760 Fatty (change of) liver, not elsewhere classified: Secondary | ICD-10-CM | POA: Diagnosis not present

## 2023-12-30 DIAGNOSIS — E1165 Type 2 diabetes mellitus with hyperglycemia: Secondary | ICD-10-CM | POA: Diagnosis not present

## 2023-12-30 DIAGNOSIS — I050 Rheumatic mitral stenosis: Secondary | ICD-10-CM | POA: Diagnosis not present

## 2023-12-30 DIAGNOSIS — Z23 Encounter for immunization: Secondary | ICD-10-CM | POA: Diagnosis not present

## 2023-12-30 DIAGNOSIS — M47819 Spondylosis without myelopathy or radiculopathy, site unspecified: Secondary | ICD-10-CM | POA: Diagnosis not present

## 2023-12-30 DIAGNOSIS — E039 Hypothyroidism, unspecified: Secondary | ICD-10-CM | POA: Diagnosis not present

## 2023-12-30 DIAGNOSIS — I5189 Other ill-defined heart diseases: Secondary | ICD-10-CM | POA: Diagnosis not present

## 2023-12-30 DIAGNOSIS — F17200 Nicotine dependence, unspecified, uncomplicated: Secondary | ICD-10-CM | POA: Diagnosis not present

## 2023-12-30 DIAGNOSIS — R011 Cardiac murmur, unspecified: Secondary | ICD-10-CM | POA: Diagnosis not present

## 2023-12-30 DIAGNOSIS — E785 Hyperlipidemia, unspecified: Secondary | ICD-10-CM | POA: Diagnosis not present

## 2023-12-30 MED ORDER — MOUNJARO 2.5 MG/0.5ML ~~LOC~~ SOAJ
2.5000 mg | SUBCUTANEOUS | 1 refills | Status: AC
  Filled 2023-12-30: qty 2, 28d supply, fill #0
  Filled 2024-01-25: qty 2, 28d supply, fill #1

## 2024-01-04 ENCOUNTER — Other Ambulatory Visit (HOSPITAL_COMMUNITY): Payer: Self-pay

## 2024-01-04 ENCOUNTER — Other Ambulatory Visit: Payer: Self-pay

## 2024-01-04 MED ORDER — FREESTYLE LIBRE 3 PLUS SENSOR MISC
3 refills | Status: AC
  Filled 2024-01-04: qty 6, 90d supply, fill #0
  Filled 2024-03-13: qty 6, 90d supply, fill #1

## 2024-01-12 ENCOUNTER — Other Ambulatory Visit: Payer: Self-pay

## 2024-01-12 ENCOUNTER — Other Ambulatory Visit (HOSPITAL_COMMUNITY): Payer: Self-pay

## 2024-01-12 MED ORDER — ATORVASTATIN CALCIUM 10 MG PO TABS
10.0000 mg | ORAL_TABLET | ORAL | 3 refills | Status: AC
  Filled 2024-01-12 (×2): qty 36, 84d supply, fill #0

## 2024-01-19 DIAGNOSIS — H35033 Hypertensive retinopathy, bilateral: Secondary | ICD-10-CM | POA: Diagnosis not present

## 2024-01-19 DIAGNOSIS — E113292 Type 2 diabetes mellitus with mild nonproliferative diabetic retinopathy without macular edema, left eye: Secondary | ICD-10-CM | POA: Diagnosis not present

## 2024-01-19 DIAGNOSIS — H25813 Combined forms of age-related cataract, bilateral: Secondary | ICD-10-CM | POA: Diagnosis not present

## 2024-01-19 DIAGNOSIS — H524 Presbyopia: Secondary | ICD-10-CM | POA: Diagnosis not present

## 2024-01-19 DIAGNOSIS — H35361 Drusen (degenerative) of macula, right eye: Secondary | ICD-10-CM | POA: Diagnosis not present

## 2024-01-19 DIAGNOSIS — H04123 Dry eye syndrome of bilateral lacrimal glands: Secondary | ICD-10-CM | POA: Diagnosis not present

## 2024-01-25 ENCOUNTER — Other Ambulatory Visit (HOSPITAL_COMMUNITY): Payer: Self-pay

## 2024-02-20 ENCOUNTER — Other Ambulatory Visit: Payer: Self-pay

## 2024-02-20 ENCOUNTER — Other Ambulatory Visit (HOSPITAL_COMMUNITY): Payer: Self-pay

## 2024-02-20 MED ORDER — NOVOLOG FLEXPEN 100 UNIT/ML ~~LOC~~ SOPN
5.0000 [IU] | PEN_INJECTOR | Freq: Three times a day (TID) | SUBCUTANEOUS | 3 refills | Status: AC | PRN
  Filled 2024-02-20: qty 9, 30d supply, fill #0
  Filled 2024-03-05 – 2024-04-09 (×3): qty 9, 30d supply, fill #1

## 2024-02-20 MED ORDER — METHOCARBAMOL 500 MG PO TABS
500.0000 mg | ORAL_TABLET | Freq: Three times a day (TID) | ORAL | 0 refills | Status: AC | PRN
  Filled 2024-02-20: qty 60, 20d supply, fill #0

## 2024-02-21 ENCOUNTER — Other Ambulatory Visit (HOSPITAL_COMMUNITY): Payer: Self-pay

## 2024-02-21 ENCOUNTER — Other Ambulatory Visit: Payer: Self-pay

## 2024-03-05 ENCOUNTER — Other Ambulatory Visit (HOSPITAL_COMMUNITY): Payer: Self-pay

## 2024-03-08 ENCOUNTER — Other Ambulatory Visit: Payer: Self-pay

## 2024-03-08 ENCOUNTER — Other Ambulatory Visit (HOSPITAL_COMMUNITY): Payer: Self-pay

## 2024-03-08 MED ORDER — MOUNJARO 5 MG/0.5ML ~~LOC~~ SOAJ
5.0000 mg | SUBCUTANEOUS | 3 refills | Status: AC
  Filled 2024-03-08 – 2024-03-13 (×2): qty 2, 28d supply, fill #0
  Filled 2024-04-04 – 2024-04-09 (×2): qty 2, 28d supply, fill #1
  Filled 2024-05-10: qty 2, 28d supply, fill #2

## 2024-03-09 ENCOUNTER — Other Ambulatory Visit: Payer: Self-pay

## 2024-03-12 DIAGNOSIS — D649 Anemia, unspecified: Secondary | ICD-10-CM | POA: Diagnosis not present

## 2024-03-12 DIAGNOSIS — E785 Hyperlipidemia, unspecified: Secondary | ICD-10-CM | POA: Diagnosis not present

## 2024-03-12 DIAGNOSIS — Z1212 Encounter for screening for malignant neoplasm of rectum: Secondary | ICD-10-CM | POA: Diagnosis not present

## 2024-03-13 ENCOUNTER — Other Ambulatory Visit: Payer: Self-pay

## 2024-03-13 ENCOUNTER — Other Ambulatory Visit (HOSPITAL_COMMUNITY): Payer: Self-pay

## 2024-03-21 ENCOUNTER — Other Ambulatory Visit: Payer: Self-pay | Admitting: Internal Medicine

## 2024-03-21 DIAGNOSIS — Z1231 Encounter for screening mammogram for malignant neoplasm of breast: Secondary | ICD-10-CM

## 2024-04-04 ENCOUNTER — Other Ambulatory Visit (HOSPITAL_COMMUNITY): Payer: Self-pay

## 2024-04-09 ENCOUNTER — Other Ambulatory Visit: Payer: Self-pay

## 2024-04-11 ENCOUNTER — Other Ambulatory Visit (HOSPITAL_COMMUNITY): Payer: Self-pay

## 2024-04-18 ENCOUNTER — Inpatient Hospital Stay
Admission: RE | Admit: 2024-04-18 | Discharge: 2024-04-18 | Payer: Self-pay | Attending: Internal Medicine | Admitting: Internal Medicine

## 2024-04-18 DIAGNOSIS — Z1231 Encounter for screening mammogram for malignant neoplasm of breast: Secondary | ICD-10-CM

## 2024-05-10 ENCOUNTER — Other Ambulatory Visit (HOSPITAL_BASED_OUTPATIENT_CLINIC_OR_DEPARTMENT_OTHER): Payer: Self-pay
